# Patient Record
Sex: Female | Born: 1935 | Race: White | Hispanic: No | Smoking: Never smoker
Health system: Southern US, Community
[De-identification: ages and names within clinical notes are randomized; demographics above are authoritative.]

## PROBLEM LIST (undated history)

## (undated) DIAGNOSIS — M545 Low back pain, unspecified: Secondary | ICD-10-CM

## (undated) DIAGNOSIS — J189 Pneumonia, unspecified organism: Secondary | ICD-10-CM

## (undated) DIAGNOSIS — K589 Irritable bowel syndrome without diarrhea: Secondary | ICD-10-CM

## (undated) DIAGNOSIS — G8929 Other chronic pain: Secondary | ICD-10-CM

## (undated) DIAGNOSIS — I10 Essential (primary) hypertension: Secondary | ICD-10-CM

## (undated) DIAGNOSIS — Z8719 Personal history of other diseases of the digestive system: Secondary | ICD-10-CM

## (undated) DIAGNOSIS — J479 Bronchiectasis, uncomplicated: Secondary | ICD-10-CM

## (undated) DIAGNOSIS — M1990 Unspecified osteoarthritis, unspecified site: Secondary | ICD-10-CM

## (undated) DIAGNOSIS — Z8711 Personal history of peptic ulcer disease: Secondary | ICD-10-CM

## (undated) HISTORY — PX: NASAL SEPTUM SURGERY: SHX37

## (undated) HISTORY — PX: DILATION AND CURETTAGE OF UTERUS: SHX78

## (undated) HISTORY — PX: FRACTURE SURGERY: SHX138

## (undated) HISTORY — PX: CARPAL TUNNEL RELEASE: SHX101

## (undated) HISTORY — PX: CATARACT EXTRACTION W/ INTRAOCULAR LENS  IMPLANT, BILATERAL: SHX1307

## (undated) HISTORY — PX: TUBAL LIGATION: SHX77

## (undated) HISTORY — PX: JOINT REPLACEMENT: SHX530

## (undated) HISTORY — PX: ROTATOR CUFF REPAIR: SHX139

## (undated) HISTORY — PX: TOTAL KNEE ARTHROPLASTY: SHX125

## (undated) HISTORY — PX: BACK SURGERY: SHX140

## (undated) HISTORY — PX: POSTERIOR FUSION LUMBAR SPINE: SUR632

---

## 1999-01-14 ENCOUNTER — Other Ambulatory Visit: Admission: RE | Admit: 1999-01-14 | Discharge: 1999-01-14 | Payer: Self-pay | Admitting: *Deleted

## 1999-08-16 ENCOUNTER — Encounter: Admission: RE | Admit: 1999-08-16 | Discharge: 1999-08-16 | Payer: Self-pay | Admitting: General Surgery

## 1999-08-16 ENCOUNTER — Encounter: Payer: Self-pay | Admitting: General Surgery

## 2000-03-20 ENCOUNTER — Other Ambulatory Visit: Admission: RE | Admit: 2000-03-20 | Discharge: 2000-03-20 | Payer: Self-pay | Admitting: *Deleted

## 2000-12-17 ENCOUNTER — Encounter: Admission: RE | Admit: 2000-12-17 | Discharge: 2000-12-17 | Payer: Self-pay | Admitting: General Surgery

## 2000-12-17 ENCOUNTER — Encounter: Payer: Self-pay | Admitting: General Surgery

## 2002-03-31 ENCOUNTER — Encounter: Admission: RE | Admit: 2002-03-31 | Discharge: 2002-03-31 | Payer: Self-pay | Admitting: General Surgery

## 2002-03-31 ENCOUNTER — Encounter: Payer: Self-pay | Admitting: General Surgery

## 2002-04-12 ENCOUNTER — Ambulatory Visit (HOSPITAL_BASED_OUTPATIENT_CLINIC_OR_DEPARTMENT_OTHER): Admission: RE | Admit: 2002-04-12 | Discharge: 2002-04-12 | Payer: Self-pay | Admitting: General Surgery

## 2002-04-12 ENCOUNTER — Encounter (INDEPENDENT_AMBULATORY_CARE_PROVIDER_SITE_OTHER): Payer: Self-pay | Admitting: Specialist

## 2003-03-03 ENCOUNTER — Encounter: Admission: RE | Admit: 2003-03-03 | Discharge: 2003-03-03 | Payer: Self-pay | Admitting: Specialist

## 2003-03-03 ENCOUNTER — Encounter: Payer: Self-pay | Admitting: Specialist

## 2003-05-23 ENCOUNTER — Encounter: Payer: Self-pay | Admitting: General Surgery

## 2003-05-23 ENCOUNTER — Encounter: Admission: RE | Admit: 2003-05-23 | Discharge: 2003-05-23 | Payer: Self-pay | Admitting: General Surgery

## 2003-11-09 ENCOUNTER — Ambulatory Visit (HOSPITAL_COMMUNITY): Admission: RE | Admit: 2003-11-09 | Discharge: 2003-11-09 | Payer: Self-pay | Admitting: Ophthalmology

## 2004-06-05 ENCOUNTER — Inpatient Hospital Stay (HOSPITAL_COMMUNITY): Admission: AD | Admit: 2004-06-05 | Discharge: 2004-06-07 | Payer: Self-pay | Admitting: *Deleted

## 2004-10-02 ENCOUNTER — Encounter: Admission: RE | Admit: 2004-10-02 | Discharge: 2004-10-02 | Payer: Self-pay | Admitting: General Surgery

## 2004-10-23 ENCOUNTER — Ambulatory Visit (HOSPITAL_COMMUNITY): Admission: RE | Admit: 2004-10-23 | Discharge: 2004-10-23 | Payer: Self-pay | Admitting: Gastroenterology

## 2005-01-31 ENCOUNTER — Encounter: Admission: RE | Admit: 2005-01-31 | Discharge: 2005-01-31 | Payer: Self-pay | Admitting: Specialist

## 2005-02-27 ENCOUNTER — Inpatient Hospital Stay (HOSPITAL_COMMUNITY): Admission: RE | Admit: 2005-02-27 | Discharge: 2005-03-04 | Payer: Self-pay | Admitting: Orthopaedic Surgery

## 2005-03-07 ENCOUNTER — Ambulatory Visit (HOSPITAL_COMMUNITY): Admission: RE | Admit: 2005-03-07 | Discharge: 2005-03-07 | Payer: Self-pay | Admitting: Orthopaedic Surgery

## 2005-11-18 ENCOUNTER — Encounter: Admission: RE | Admit: 2005-11-18 | Discharge: 2005-11-18 | Payer: Self-pay | Admitting: General Surgery

## 2005-12-01 ENCOUNTER — Inpatient Hospital Stay (HOSPITAL_COMMUNITY): Admission: RE | Admit: 2005-12-01 | Discharge: 2005-12-05 | Payer: Self-pay | Admitting: Specialist

## 2006-01-21 ENCOUNTER — Encounter: Admission: RE | Admit: 2006-01-21 | Discharge: 2006-01-21 | Payer: Self-pay | Admitting: Orthopaedic Surgery

## 2006-01-29 ENCOUNTER — Ambulatory Visit (HOSPITAL_COMMUNITY): Admission: RE | Admit: 2006-01-29 | Discharge: 2006-01-29 | Payer: Self-pay | Admitting: Orthopaedic Surgery

## 2006-04-24 IMAGING — CR DG CHEST 2V
2 series · 2 of 2 positions shown · non-contrast
Comparison: none

CLINICAL DATA: OA left knee, some congestion.   Preop work-up. 
CHEST - 2 VIEW: 
Mild lower thoracic scoliosis convex to the left.  Mild left ventricular enlargement/hypertrophy.  Overall cardiac size appears within normal limits.  No pulmonary vascular congestion or active lung process.  Transpedicular screws mid and lower lumbar spine.   Possible faintly calcified right hilar lymph nodes.

[view not recorded (1 of 2)]
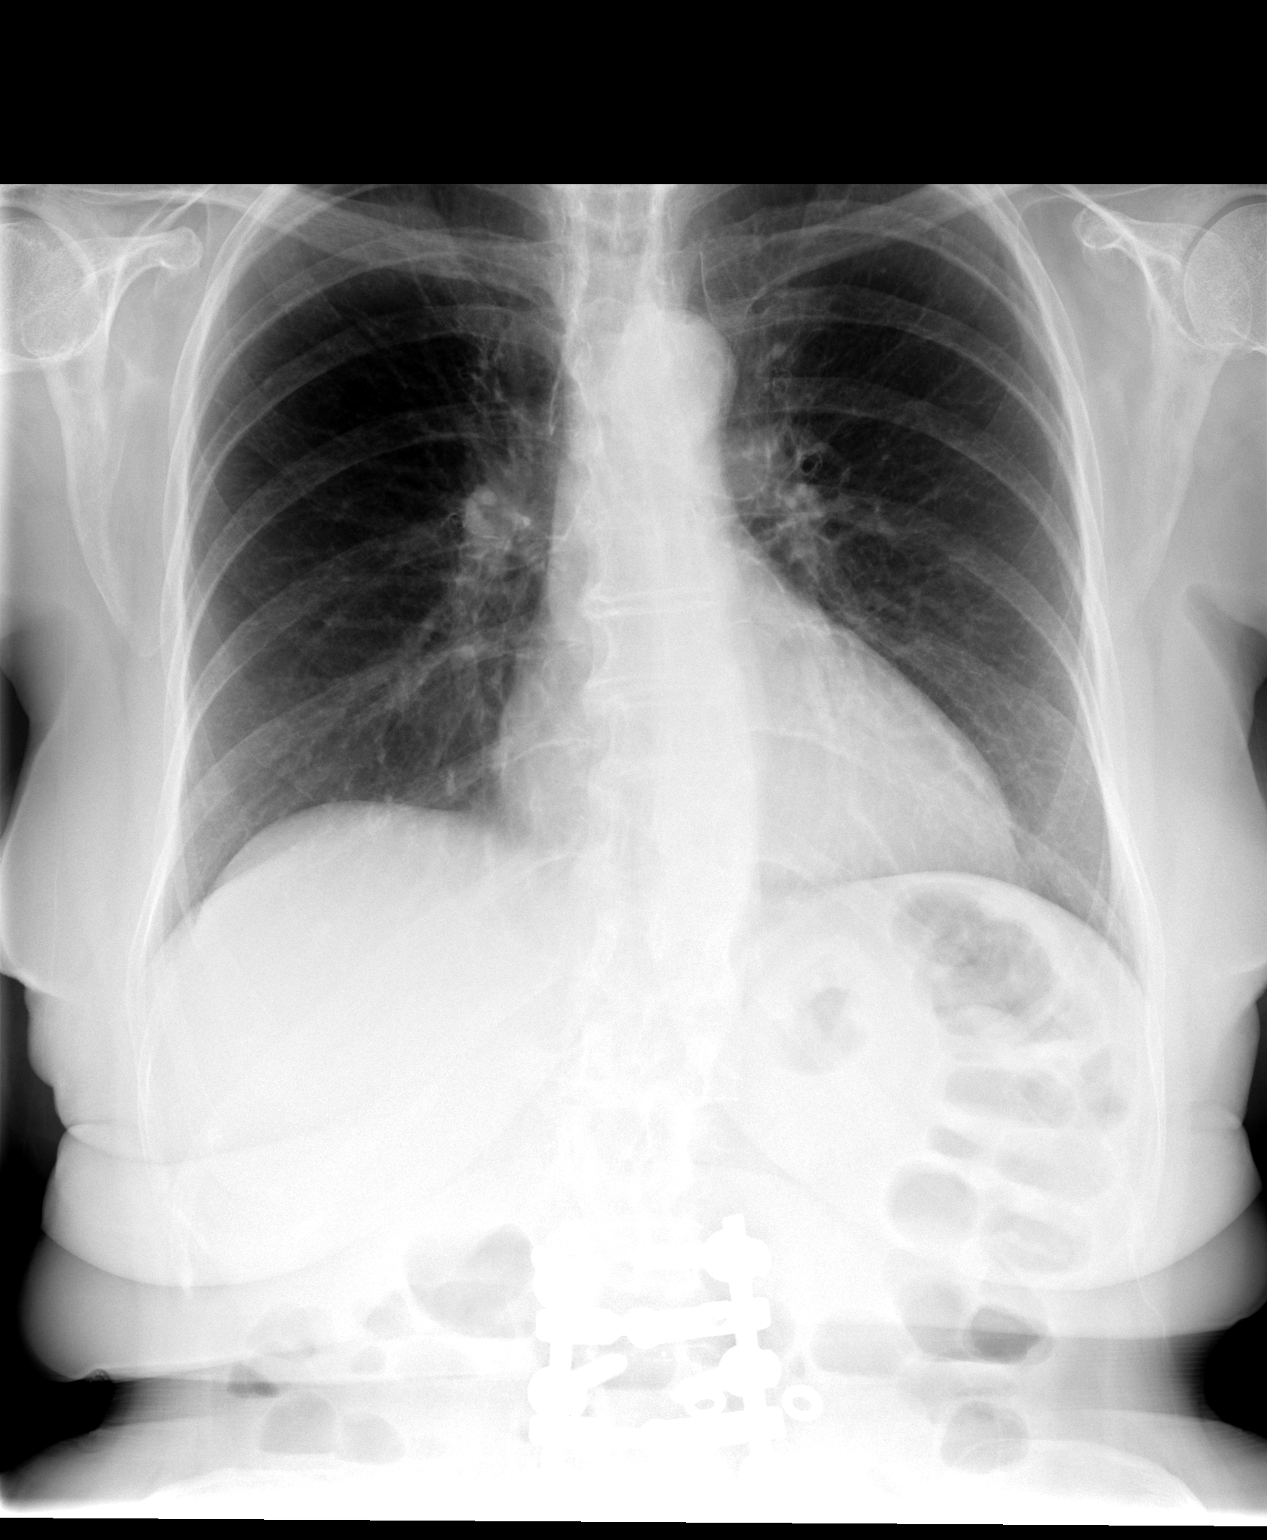

[view not recorded (2 of 2)]
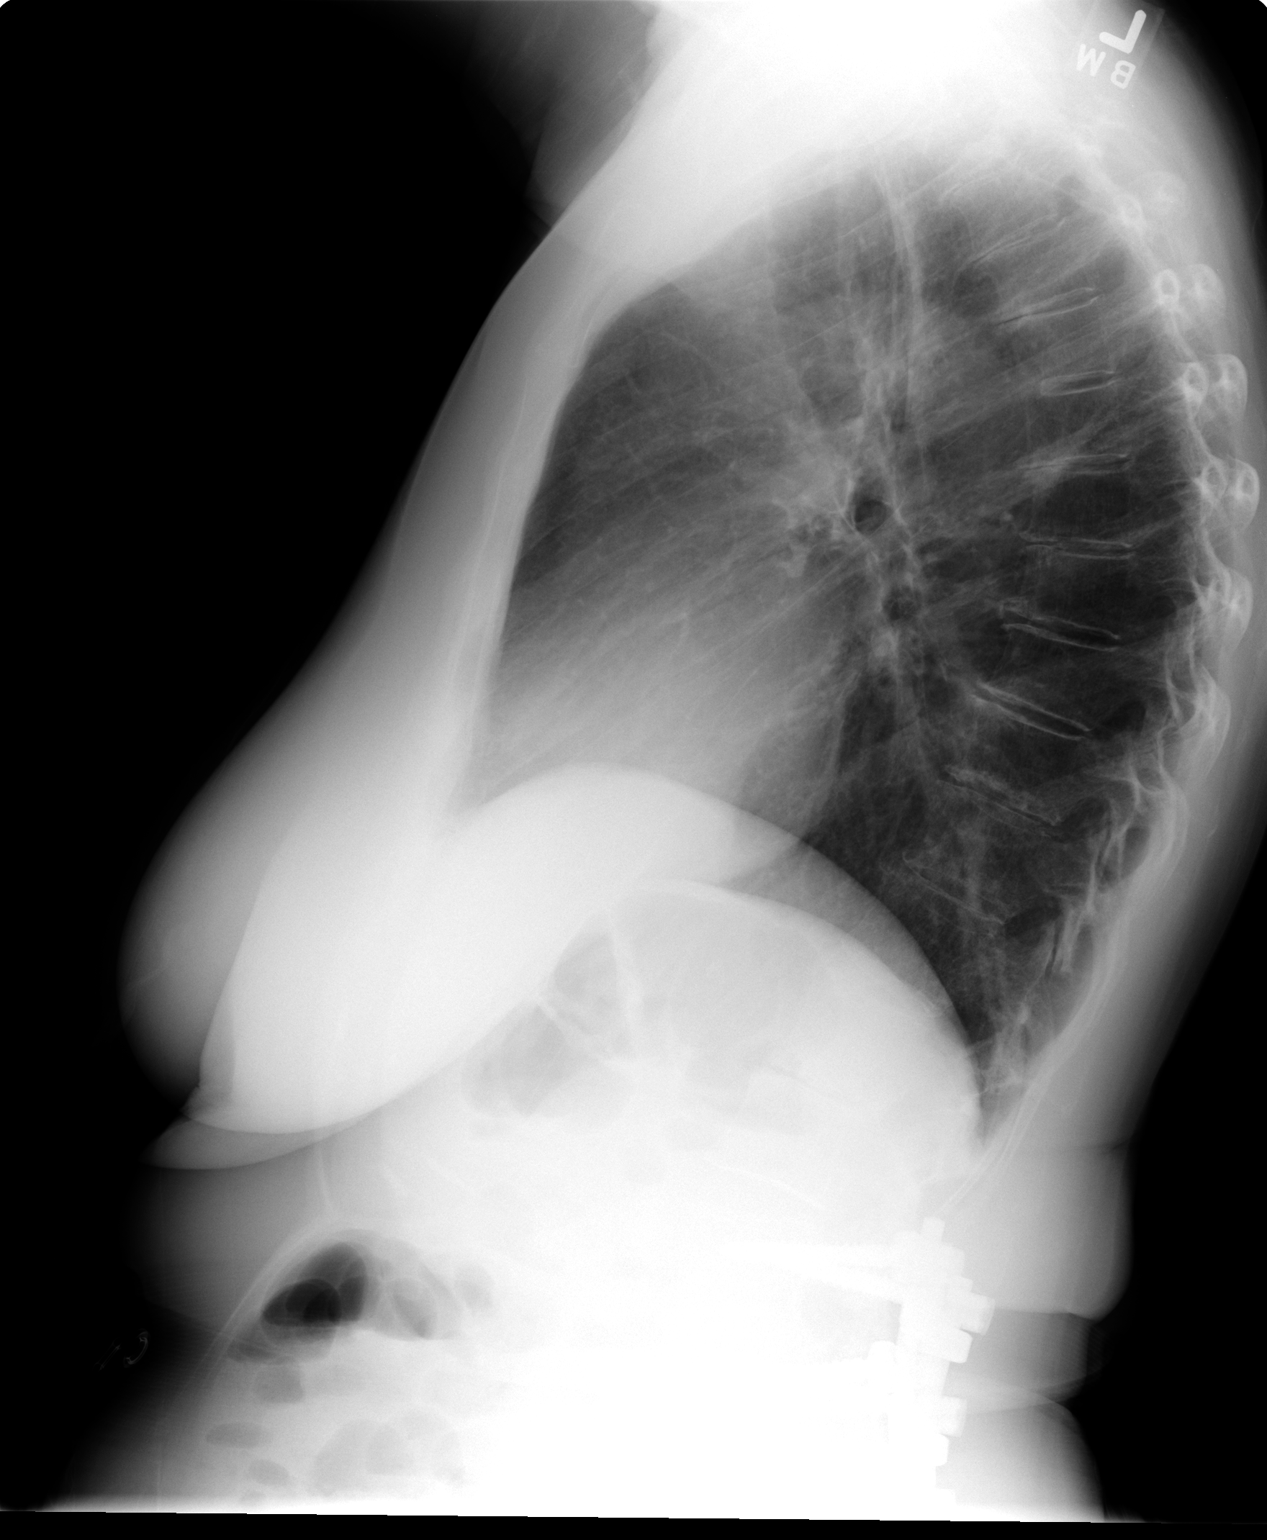

[2 of 2 positions shown; findings below may reference images not displayed]

IMPRESSION: No active cardiopulmonary disease.

## 2006-11-30 ENCOUNTER — Encounter: Admission: RE | Admit: 2006-11-30 | Discharge: 2006-11-30 | Payer: Self-pay | Admitting: General Surgery

## 2008-02-20 ENCOUNTER — Encounter: Admission: RE | Admit: 2008-02-20 | Discharge: 2008-02-20 | Payer: Self-pay | Admitting: Orthopaedic Surgery

## 2008-03-14 ENCOUNTER — Encounter: Admission: RE | Admit: 2008-03-14 | Discharge: 2008-03-14 | Payer: Self-pay | Admitting: Orthopaedic Surgery

## 2008-04-18 ENCOUNTER — Encounter: Admission: RE | Admit: 2008-04-18 | Discharge: 2008-04-18 | Payer: Self-pay | Admitting: Orthopaedic Surgery

## 2008-05-12 ENCOUNTER — Encounter: Admission: RE | Admit: 2008-05-12 | Discharge: 2008-05-12 | Payer: Self-pay | Admitting: Orthopaedic Surgery

## 2008-08-09 ENCOUNTER — Encounter: Admission: RE | Admit: 2008-08-09 | Discharge: 2008-08-09 | Payer: Self-pay | Admitting: Family Medicine

## 2009-06-20 ENCOUNTER — Encounter (HOSPITAL_COMMUNITY): Admission: RE | Admit: 2009-06-20 | Discharge: 2009-08-10 | Payer: Self-pay | Admitting: Orthopaedic Surgery

## 2009-12-08 ENCOUNTER — Other Ambulatory Visit: Payer: Self-pay | Admitting: Emergency Medicine

## 2009-12-08 ENCOUNTER — Emergency Department (HOSPITAL_COMMUNITY): Admission: EM | Admit: 2009-12-08 | Discharge: 2009-12-08 | Payer: Self-pay | Admitting: Emergency Medicine

## 2010-11-29 ENCOUNTER — Other Ambulatory Visit: Payer: Self-pay | Admitting: Obstetrics and Gynecology

## 2010-11-29 DIAGNOSIS — Z1231 Encounter for screening mammogram for malignant neoplasm of breast: Secondary | ICD-10-CM

## 2010-12-06 ENCOUNTER — Ambulatory Visit: Payer: Self-pay

## 2010-12-17 ENCOUNTER — Other Ambulatory Visit: Payer: Self-pay | Admitting: Obstetrics and Gynecology

## 2010-12-17 DIAGNOSIS — R58 Hemorrhage, not elsewhere classified: Secondary | ICD-10-CM

## 2010-12-24 ENCOUNTER — Ambulatory Visit (HOSPITAL_COMMUNITY)
Admission: RE | Admit: 2010-12-24 | Discharge: 2010-12-24 | Disposition: A | Discharge status: Home / Self Care | Payer: Medicare Other | Source: Ambulatory Visit | Attending: Obstetrics and Gynecology | Admitting: Obstetrics and Gynecology

## 2010-12-24 ENCOUNTER — Other Ambulatory Visit: Payer: Self-pay | Admitting: Obstetrics and Gynecology

## 2010-12-24 DIAGNOSIS — D259 Leiomyoma of uterus, unspecified: Secondary | ICD-10-CM | POA: Insufficient documentation

## 2010-12-24 DIAGNOSIS — R58 Hemorrhage, not elsewhere classified: Secondary | ICD-10-CM

## 2010-12-24 DIAGNOSIS — N938 Other specified abnormal uterine and vaginal bleeding: Secondary | ICD-10-CM | POA: Insufficient documentation

## 2010-12-24 DIAGNOSIS — N949 Unspecified condition associated with female genital organs and menstrual cycle: Secondary | ICD-10-CM | POA: Insufficient documentation

## 2010-12-30 LAB — PROTIME-INR
INR: 0.99 (ref 0.00–1.49)
Prothrombin Time: 13 seconds (ref 11.6–15.2)

## 2010-12-30 LAB — BASIC METABOLIC PANEL
BUN: 19 mg/dL (ref 6–23)
Calcium: 8.7 mg/dL (ref 8.4–10.5)
GFR calc non Af Amer: >60 mL/min (ref >60)
Potassium: 4.3 mEq/L (ref 3.5–5.1)

## 2010-12-30 LAB — CBC
HCT: 36.2 % (ref 36.0–46.0)
Platelets: 295 10*3/uL (ref 150–400)
WBC: 5.8 10*3/uL (ref 4.0–10.5)

## 2010-12-30 LAB — DIFFERENTIAL
Eosinophils Relative: 1 % (ref 0–5)
Lymphocytes Relative: 26 % (ref 12–46)
Lymphs Abs: 1.5 10*3/uL (ref 0.7–4.0)
Neutro Abs: 3.8 10*3/uL (ref 1.7–7.7)
Neutrophils Relative %: 65 % (ref 43–77)

## 2010-12-30 LAB — APTT: aPTT: 28 seconds (ref 24–37)

## 2011-02-21 NOTE — Op Note (Signed)
NAMEMAYLEY, LISH              ACCOUNT NO.:  [id number]   MEDICAL RECORD NO.:  [id number]          PATIENT TYPE:  AMB   LOCATION:  SDS                          FACILITY:  [facility]   PHYSICIAN:  Sharolyn Douglas, M.D.        DATE OF BIRTH:  [birth year]   DATE OF PROCEDURE:  01/29/2006  DATE OF DISCHARGE:  01/29/2006                                 OPERATIVE REPORT   DIAGNOSES:  1.  Left sacroiliac joint pain.  2.  History of previous three-leve lumbar fusion, L3 through S1.   PROCEDURE:  1.  Left sacroiliac joint injection.  2.  Fluoroscopic imaging used for left sacroiliac joint injection.   SURGEON:  Sharolyn Douglas, M.D.   ASSISTANT:  None.   ANESTHESIA:  MAC plus local.   ESTIMATED BLOOD LOSS:  None.   INDICATIONS:  The patient is a pleasant 77 year old female who had a three-  level lumbar fusion and did very well postoperatively.  She recently had a  left total knee replacement during physical therapy. She developed pain in  her left hip.  On exam, this was felt to be localized the left SI joint.  She now presents for left SI joint diagnostic and possible therapeutic  injection.   PROCEDURE:  She was identified in the holding area, taken to the operating  room.  She was given prophylactic IV antibiotics.  She was given sedation by  the anesthesia department, carefully positioned prone.  The left hip was  prepped and draped in the usual sterile fashion.  Fluoroscopy was brought  into the field, and the SI joint was imaged.  The skin overlying the  injection site was anesthetized with 3 cc of lidocaine 1% without  epinephrine.  An 22-gauge spinal needle was then carefully placed into the  SI joint using the fluoroscopy.  One cc of Omnipaque was injected, and we  had good intra-articular spread of the injection.  Aspiration was performed,  and there was no blood.  We then injected 40 mg of Depo-Medrol and 4 cc of  1% lidocaine into the joint.  The needle was removed, and a Band-Aid  was  placed.  The patient was then turned supine and transferred to recovery in  stable condition.  She noted immediate improvement in her left hip pain.  She is discharged home in good condition.  If she has any increased pain or  redness at the injection site, she will call the office.  Otherwise, follow-  up in two weeks.      Sharolyn Douglas, M.D.  Electronically Signed    MC/MEDQ  D:  01/29/2006  T:  01/30/2006  Job:  [id number]

## 2011-02-21 NOTE — Op Note (Signed)
NAME:  [name], SCHOCH                        ACCOUNT NO.:  [id number]   MEDICAL RECORD NO.:  [id number]                   PATIENT TYPE:  OIB   LOCATION:  2899                                 FACILITY:  [facility]   PHYSICIAN:  Robert L. Dione Booze, M.D.               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   PROCEDURE:  Upper eyelid blepharoplasties.   SURGEON:  Robert L. Dione Booze, M.D.   ANESTHESIA:  1% Xylocaine with epinephrine.   INDICATIONS FOR PROCEDURE:  This lady has been followed  in my office for  several years with worsening dermatochalosis of the skin  of her upper  eyelids. She has had bilateral cataract surgery about a year ago and did  extremely well. When seen on August 09, 2003, she did have multiple  complaints regarding the skin of the upper lids and could feel the weight of  the skin  and it blocks her upper  field of vision and other  complaints.  The procedure was discussed and she was told that bleeding is the worst  problem, and she was  told of other  possible  complications. The procedure  was discussed in detail.   Photographs were taken, and show how the skin covers her eyelids and the  margin reflex distance has been measured as approximately 2 mm. The visual  field testing shows a very large loss of the superior  field where the skin  is taped compared to when  it is not taped. Approximately 3/4ths of the  field is lost, which amounts to around 25 degrees of the superior  field in  each eye, which is a large amount. She decided that she wanted to have the  skin  removed for optical reasons and since she has multiple complaints.  Medically she  should be stable for this.   Justification to perform the procedure in an outpatient setting is routine.  Justification for overnight stay is none.   DESCRIPTION OF PROCEDURE:  The patient arrived in the minor surgery room at  [facility] and was prepped and draped. The skin to be removed was  carefully  demarcated and next the skin was injected using local  infiltration.  The skin was then carefully  removed and bleeding was  controlled with electrocautery. Some underlying fatty tissue  was excised.   The wound was closed on each side using running 6-0 nylon suture and cold  packs were applied. The eyes were not patched. Polysporin ointment was used.  She left the operating room having done well   For follow up  care the patient will be seen in my office in 5 to 6 days to  have the sutures removed. For bleeding she is to use pressure.                                               Robert L. Dione Booze, M.D.    RLG/MEDQ  D:  11/11/2003  T:  11/12/2003  Job:  [id number]   cc:   Molly Maduro L. Foy Guadalajara, M.D.  [address] [place]  [place] [address]  Fax: [phone]   Joette Catching, M.D.

## 2011-02-21 NOTE — H&P (Signed)
[name], [name]:  [id number]   MEDICAL RECORD NO.:  [id number]          PATIENT TYPE:  INP   LOCATION:  NA                           FACILITY:  [facility]   PHYSICIAN:  Erasmo Leventhal, M.D.DATE OF BIRTH:  [birth year]   DATE OF ADMISSION:  12/01/2005  DATE OF DISCHARGE:                                HISTORY & PHYSICAL   DATE OF SURGERY:  December 01, 2005.   CHIEF COMPLAINT:  Left knee end-stage osteoarthritis.   HISTORY OF PRESENT ILLNESS:  This is a 77 year old lady with history of end-  stage osteoarthritis of the left knee.  She has failed conservative  management of her left knee pain and has pain with all activities and at  rest. She has had a previous total knee arthroplasty of the right knee with  good results and now requests total knee arthroplasty of the left knee.  The  surgery risks, benefits and aftercare were discussed in detail with the  patient, questions were invited and answered.  She received medical  clearance from Dr. Dewaine Oats, her medical doctor, and at this time she is  scheduled for total knee arthroplasty of the left knee.   PAST MEDICAL HISTORY:  Serious medical illnesses include constipation,  hypertension, reflux and depression.   ALLERGIES:  DRUG ALLERGY TO CODEINE with nausea and vomiting.   CURRENT MEDICATIONS:  1.  Nexium 40 mg one p.o. daily.  2.  Zelnorm 0.6 mg one b.i.d.  3.  Glycalox 17 grams q.h.s.  4.  Senokot b.i.d.  5.  Milk Of Magnesia four tablespoons q.h.s.  6.  Prempro 0.3/1.5 daily.  7.  Concerta 36 mg one daily.  8.  Cymbalta 20 mg two q.h.s.  9.  Triazolam 0.25 mg one q.h.s.  10. Lopressor 50 mg one-half tablet b.i.d.   PAST SURGICAL HISTORY:  Include nasal surgery, bunionectomy, lumbar  laminectomy, total knee arthroplasty right knee, right rotator cuff repair,  lumbar fusion X4, carpal tunnel release on the right.   FAMILY HISTORY:  Positive for cancer.   SOCIAL HISTORY:  The patient is  widowed. She is retired. She lives at home.  She does not smoke and drinks a glass of wine occasionally.   REVIEW OF SYSTEMS:  CENTRAL NERVOUS SYSTEM:  Positive for history of  depression, otherwise negative.  CARDIOVASCULAR:  Positive for hypertension,  negative for chest pain or palpitations.  PULMONARY:  No shortness of  breath, paroxysmal nocturnal dyspnea or orthopnea.  GASTROINTESTINAL:  Positive for reflux, negative for ulcers.  GENITOURINARY:  Negative for  urinary tract difficulty.  MUSCULOSKELETAL:  Positives in history of present  illness.   PHYSICAL EXAMINATION:  VITAL SIGNS:  Blood pressure 128/62, respirations 14,  pulse 60 and regular.  GENERAL APPEARANCE:  This is a well-developed, well-nourished lady in no  acute distress.  HEENT:  Head is normocephalic.  Nose patent. Ears are patent. Pupils equal,  round, reactive to light.  Throat without injection.  NECK:  Supple with no lymphadenopathy.  Carotids 2+ without bruits.  CHEST:  Clear to auscultation with no rales or rhonchi.  Respirations 14.  HEART:  Regular rate and rhythm, 60 beats per minute without murmurs.  ABDOMEN:  Soft with active bowel sounds. No masses, organomegaly.  NEUROLOGICAL:  Patient is alert and oriented to time, place, person.  Cranial nerves II-XII grossly intact.  EXTREMITIES:  The right knee is status post total knee arthroplasty with 0  to 120 degree range of motion, excellent stability.  The left knee shows a  slight varus deformity 0 to 130 degree range of motion with crepitation.  Dorsalis pedis pulse and posterior tibial pulses are 2+.  NEUROLOGICAL:  Neurovascular status is intact.   CLINICAL DATA:  X-rays show end-stage osteoarthritis of the left knee.   IMPRESSION:  End-stage osteoarthritis of left knee.   PLAN:  Total knee arthroplasty left knee.      Jaquelyn Bitter. Chabon, P.A.    ______________________________  Erasmo Leventhal, M.D.    SJC/MEDQ  D:  11/26/2005  T:   11/26/2005  Job:  [id number]

## 2011-02-21 NOTE — Op Note (Signed)
NAME:  [name], [name]:  [id number]   MEDICAL RECORD NO.:  [id number]          PATIENT TYPE:  AMB   LOCATION:  ENDO                         FACILITY:  [facility]   PHYSICIAN:  Graylin Shiver, M.D.   DATE OF BIRTH:  06/25/1930   DATE OF PROCEDURE:  10/23/2004  DATE OF DISCHARGE:                                 OPERATIVE REPORT   PROCEDURE PERFORMED:  Esophagogastroduodenoscopy with biopsy for CLO test.   INDICATION:  Chest pain, etiology unclear, rule out upper GI lesion.   Informed consent was obtained after explanation of the risks of bleeding,  infection and perforation.   PREMEDICATION:  Fentanyl 75 mcg IV, Versed 7.5 milligrams IV.   PROCEDURE:  With the patient in the left lateral decubitus position, the  Olympus gastroscope was inserted into the oropharynx and passed into the  esophagus. It was advanced down the esophagus, then into the stomach and  into the duodenum. The second portion and bulb of the duodenum were normal.  The stomach showed a diffuse erythema compatible with gastritis. Biopsy for  CLO-test was obtained. No lesions were seen in the fundus or cardia.  There  was small hiatal hernia.  The esophagus looked normal in its entirety. She  tolerated the procedure well without complications.   IMPRESSION:  1.  Small hiatal hernia.  2.  Gastritis.   PLAN:  The CLO test will be checked.       SFG/MEDQ  D:  10/23/2004  T:  10/23/2004  Job:  [id number]   cc:   Molly Maduro L. Foy Guadalajara, M.D.  [address] [place]  [place] [address]  Fax: [phone]

## 2011-02-21 NOTE — Discharge Summary (Signed)
[name], [name]:  [id number]   MEDICAL RECORD NO.:  [id number]          PATIENT TYPE:  INP   LOCATION:  3715                         FACILITY:  [facility]   PHYSICIAN:  Meade Maw, M.D.    DATE OF BIRTH:  02-14-1930   DATE OF ADMISSION:  06/05/2004  DATE OF DISCHARGE:  06/07/2004                                 DISCHARGE SUMMARY   ADMISSION DIAGNOSES:  1.  Chest pain.  2.  Gastroesophageal reflux disease.   DISCHARGE DIAGNOSES:  1.  Chest pain, negative for myocardial infarction by cardiac enzymes and      electrocardiogram.  2.  Irritable bowel syndrome/constipation.  3.  Gastroesophageal reflux disease.   IMAGING PROCEDURES:  Stress Cardiolite study June 06, 2004.   COMPLICATIONS:  None.   CONDITION ON DISCHARGE:  Stable.   HISTORY OF PRESENT ILLNESS:  Please see complete H&P for details, but in  short this is a 77 year old female with no prior history of CAD who  presented to the emergency room on June 05, 2004 with a two-week history  of chest pressure.  Initially, her symptoms began at night and would be  somewhat better after a bowel movement.  She at first thought it was  secondary to constipation with this occurring almost every night.  She then  began experiencing pain in her jaw and her neck.  It began occurring more  than once a day and at other times than at night.  She presented to Dr.  Lindell Spar office on the day of admission for evaluation.  While there, she  developed chest pain.  She was given one sublingual nitroglycerin with  relief.  She was sent to the ER for admission.   PHYSICAL EXAMINATION ON ADMISSION:  Please see complete H&P, but in short,  her vital signs were stable.  She was afebrile.  Physical exam was  essentially without any abnormalities.   EKG showed a sinus rhythm with sinus tachycardia with a right bundle branch  block and a ventricular rate of 132.   LABORATORIES ON ADMISSION:  Normal CBC.   Potassium was slightly low at 3.4.  Glucose 100.  LFTs and renal function normal.  Cardiac enzymes were also  normal for the initial set.   HOSPITAL COURSE:  She was admitted to rule out MI with serial enzymes.  A  stress Cardiolite study was planned.  Sublingual nitroglycerin for recurrent  chest pain.  Check fasting lipid profile.  Start Lovenox and low-dose beta  blocker.   The patient had some recurrent chest and jaw pain during that night.  Vital  signs remained stable.  EKG was performed without changes.  Her discomfort  was relieved with one sublingual nitroglycerin.  Repeat cardiac enzymes were  normal x2 more sets.  A fasting lipid profile showed a total of 197,  triglycerides 142, HDL 54, LDL 115.   A stress Cardiolite study was performed on June 06, 2004.  The results  were negative for ischemia or fixed perfusion defect.  EF was estimated at  81%.   She was discharged home without further incident.  DISCHARGE MEDICATIONS:  Prempro daily, Allegra 180 mg daily, Celebrex 200 mg  daily, Nexium 40 mg daily, Zelnorm 6 mg daily, Vicodin p.r.n., Ativan 1.5 mg  p.r.n., Lopressor 50 mg 1/2 tablet b.i.d., nitroglycerin 0.4 mg sublingually  p.r.n.   DIET:  She is to maintain a low salt diet.   ACTIVITY:  She has no activity restrictions.   She will obtain a chest x-ray prior to her followup visit with Dr. Fraser Din.   She has an appointment to see Dr. Fraser Din back for followup on Friday,  June 21, 2004 at 11 a.m.      Hele   HB/MEDQ  D:  08/15/2004  T:  08/16/2004  Job:  [id number]   cc:   [name] Maduro L. Foy Guadalajara, M.D.  [address] [place]  [place] [address]  Fax: [phone]

## 2011-02-21 NOTE — Op Note (Signed)
NAMEAMAYRANI, [name]              ACCOUNT NO.:  [id number]   MEDICAL RECORD NO.:  [id number]          PATIENT TYPE:  INP   LOCATION:  5033                         FACILITY:  [facility]   PHYSICIAN:  Sharolyn Douglas, M.D.        DATE OF BIRTH:  06/08/1930   DATE OF PROCEDURE:  02/27/2005  DATE OF DISCHARGE:                                 OPERATIVE REPORT   DIAGNOSES:  1.  Degenerative lumbar scoliosis.  2.  Degenerative spondylolisthesis, L3-4 and L4-5.  3.  Lumbar spinal stenosis.  4.  History of left L5-S1 hemilaminotomy and diskectomy.   PROCEDURE:  1.  Revision, L5-S1 laminectomy, with wide decompression of the thecal sac      and nerve roots bilaterally.  2.  Primary L3-4 and L4-5 laminectomy with wide decompression of the thecal      sac and nerve roots bilaterally.  3.  Posterior spinal arthrodesis, L3 through S1.  4.  Segmental pedicle screw instrumentation, L3 through S1, using the Abbott      spine system.  5.  Transforaminal lumbar interbody fusion, L3-4 and L4-5, with placement of      2 peak cages.  6.  Local autogenous bone graft supplemented with bone morphogenic protein.   SURGEON:  Sharolyn Douglas, M.D.   ASSISTANT:  Verlin Fester, P.A.   ANESTHESIA:  General endotracheal.   COMPLICATIONS:  None   ESTIMATED BLOOD LOSS:  300 mL.   INDICATIONS:  The patient is a pleasant 77 year old female with  progressively worsening back and bilateral lower extremity pain, left  greater than right.  She is taking escalating doses of narcotics without  relief.  She has a previous history of laminotomy on the left at L5-S1.  Her  plain radiographs show degenerative scoliosis and spondylolisthesis at L3-4  and L4-5.  CT myelogram demonstrates spinal stenosis and lateral recess  narrowing most advanced at L3-4 and L4-5 with biforaminal stenosis at L5-S1.  She has elected to undergo lumbar decompression and fusion in hopes of  improving her symptoms.  She is aware of the risk and benefits  including  adjacent segment problems above her fusion.   PROCEDURE:  The patient was identified in the holding area and taken to the  operating room.  She underwent general endotracheal intubation without  difficulty, given prophylactic IV antibiotics.  She was positioned prone on  the Wilson frame, all bony prominences padded, face and eyes protected at  all times.  The back was prepped and draped in the usual sterile fashion.  A  midline incision was made incorporating the previous L5-S1 incision.  Dissection was carried through the deep fascia.  The paraspinal muscles were  elevated out over the tips the transverse processes of L3, L4 and L5, as  well as the sacral ala bilaterally.  The L3-4 and L4-5 facet joints were  found to be widened.  The L5-S1 level was degenerative.  We were able to  demonstrate motion at L5-S1.  Deep retractors were placed.  We then  completed a revision laminectomy at L5-S1 by carefully dissecting the  epidural fibrosis  from the laminotomy defect.  The entire residual spinous  process and lamina of L5 was then removed.  The laminectomy was continued  up, removing the entire spinous process and lamina of L3 and L4.  The  laminectomies were then carried out laterally, decompressing the lateral  recess by undercutting the facets and completing foraminotomies at each  level.  We then turned our attention to placing pedicle screws at L3, L4, L5  and the sacrum bilaterally.  We utilized 6.5-mm screws in L3, L4 and L5, 7.5-  mm screws in the sacrum.  The patient's bone quality was poor and the screw  purchase was marginal.  Each pedicle hole was palpated and there were no  breeches.  We were also able to palpate the pedicles from within the spinal  canal.  The pedicle screws were stimulated using triggered EMGs and there  were no deleterious changes.  We then turned our attention to completing a  transforaminal lumbar interbody fusion on the left at L3-4 and L4-5.   Facetectomies were completed.  The transversing and exiting nerve roots were  identified and protected at all times.  Free-running EMGs were monitored.  Annulotomies were completed.  The disk space was dilated up to 9 mm at L3-4.  The cartilaginous endplates were scraped using the TLIF instruments.  Radical diskectomy was completed.  The disk space was then packed with local  autogenous bone graft and BMP sponges.  A 9-mm PEEK cage was then inserted  into the interspace and tamped anteriorly and across the midline.  A similar  procedure was carried out at L4-5, this time utilizing an 11-mm PEEK spacer.  We then placed 100-mm titanium rods which had been bent into lordosis into  the polyaxial screw heads.  Compression was applied across the L3-4 and L4-5  segments before shearing off the locking caps.  Two cross-connectors were  utilized.  The wound was irrigated.  The posterior spinal arthrodesis was  completed by decorticating the transverse process of L3, L4, L5 and the  sacral ala bilaterally.  The remaining local bone graft was packed into the  lateral gutters.  Deep Hemovac drain left in place.  Deep fascia was closed  with a running #1 Vicryl suture, subcutaneous layer closed with interrupted  2-0 Vicryl suture followed by a running 3-0 subcuticular Vicryl suture on  the skin edges, Benzoin and Steri-Strips placed, and sterile dressing  applied.  The patient was turned supine, extubated without difficulty and  transferred to recovery room in stable condition, able to move her upper and  lower extremities.      MC/MEDQ  D:  02/27/2005  T:  02/28/2005  Job:  [id number]

## 2011-02-21 NOTE — Discharge Summary (Signed)
[name], [name]:  [id number]   MEDICAL RECORD NO.:  [id number]          PATIENT TYPE:  INP   LOCATION:  1503                         FACILITY:  [facility]   PHYSICIAN:  Erasmo Leventhal, M.D.DATE OF BIRTH:  1930/02/20   DATE OF ADMISSION:  12/01/2005  DATE OF DISCHARGE:  12/05/2005                                 DISCHARGE SUMMARY   ADMISSION DIAGNOSES:  End-stage on admission, left knee.   DISCHARGE DIAGNOSES:  End-stage on admission, left knee.   OPERATION:  Total knee arthroplasty, left knee.   BRIEF HISTORY:  This is a 77 year old lady with a history of end-stage  osteoarthritis of the left knee that has failed conservative management.  After discussion of the treatment, benefits, risks and options, the patient  now scheduled for total knee arthroplasty. Surgery is to go ahead as  scheduled.   LABORATORY DATA:  Admission CBC showed hemoglobin low at 10.9, hematocrit  low at 31.6, RBC low at 3.9. She reached a low of 8.7 and 25.8 on the 2nd as  far as hemoglobin and hematocrit were concerned. Admission CMET within  normal limits. Admission PT and PTT within normal limits. PT/INR at  discharge was 38.0 on the PT and INR 3.9. Her admission CMET showed total  protein and albumin slightly low, glucose slightly high at 114 otherwise  normal. She ran mildly elevated glucose throughout admission, calcium was  mildly decreased at 8.1. Urinalysis within normal limits.   HOSPITAL COURSE:  The patient tolerated the operative procedure well.  The  first postoperative day she was feeling good, vital signs stable, afebrile.  O2 98 on 2 liters. I&O's were good, dressing was dry and drain was removed  without difficulty. The second postoperative day she was comfortable, vital  signs stable, she was afebrile, dressing was changed, the wound looked good.  Calves were negative, neurovascular status intact. Hemoglobin and hematocrit  were acceptable and lytes were  okay. Discharge plan was made for Saturday.  The patient had some vision symptoms on December 03, 2005 and Dr. Dione Booze was  called to evaluate. The patient was evaluated by Cherly Beach, PA and  this was discussed with Dr. Abundio Miu and subsequently a CT scan was ordered  of her head to rule out intracranial bleed as a cause of her blurred vision.  Her INR was noted to be at the upper end of therapeutic range. On December 04, 2005, blurred vision had resolved, patient was comfortable, vital signs were  stable. She was afebrile, hemoglobin 8.8. CT scan was negative. Dressing was  changed, wound was benign, calves were negative. The patient wanted to go  home and plans were made for discharge in the morning. Despite being ordered  to hold Coumadin, on March 1 Coumadin was inadvertently given and we will  watch INR and hold her Coumadin for the first day or two after discharge  until she gets back down into the 2 to 2.5 range. On December 05, 2005, vital  signs stable, afebrile, wound benign, no inadvertent swelling, increased  PT/INR, hemoglobin and hematocrit acceptable, the patient was subsequently  discharged home for followup in the office.   CONDITION ON DISCHARGE:  Improved.   DISCHARGE MEDICATIONS:  1.  Percocet 1-2 q.4-6 h p.r.n. pain.  2.  Robaxin 500 1 p.o. q.8 h p.r.n. spasm.  3.  Coumadin to take as directed by the pharmacist.   Followup in the office in 2 weeks. Discharge instructions given.      [name]. Chabon, P.A.    ______________________________  Erasmo Leventhal, M.D.    SJC/MEDQ  D:  12/15/2005  T:  12/16/2005  Job:  [id number]

## 2011-02-21 NOTE — Op Note (Signed)
[name], [name]              ACCOUNT NO.:  [id number]   MEDICAL RECORD NO.:  [id number]          PATIENT TYPE:  AMB   LOCATION:  ENDO                         FACILITY:  [facility]   PHYSICIAN:  Graylin Shiver, M.D.   DATE OF BIRTH:  12-27-1929   DATE OF PROCEDURE:  10/23/2004  DATE OF DISCHARGE:                                 OPERATIVE REPORT   PROCEDURE PERFORMED:  Colonoscopy.   INDICATION:  Screening.   Informed consent was obtained after explanation of the risks of bleeding,  infection and perforation.   PREMEDICATION:  The procedure was done immediately after an EGD with an  additional 25 mcg of fentanyl and 2.5 milligrams of Versed given.   PROCEDURE:  With the patient in the left lateral decubitus position, a  rectal exam was performed. No masses were felt. The Olympus colonoscope was  inserted into the rectum and advanced around the colon to the cecum. Cecal  landmarks were identified.  The cecum and ascending colon were normal. The  transverse colon normal. The descending colon and sigmoid showed a few  diverticula.  The rectum was normal.  The patient tolerated the procedure  well without complications.   IMPRESSION:  Mild diverticulosis of the left colon.  Otherwise normal  colonoscopy to cecum   I would recommend a follow-up screening colonoscopy again in 10 years.       SFG/MEDQ  D:  10/23/2004  T:  10/23/2004  Job:  [id number]

## 2011-02-21 NOTE — Discharge Summary (Signed)
NAMECAMRYN, [name]              ACCOUNT NO.:  [id number]   MEDICAL RECORD NO.:  [id number]          PATIENT TYPE:  INP   LOCATION:  5033                         FACILITY:  [facility]   PHYSICIAN:  Sharolyn Douglas, M.D.        DATE OF BIRTH:  1929/10/16   DATE OF ADMISSION:  02/27/2005  DATE OF DISCHARGE:  03/04/2005                                 DISCHARGE SUMMARY   ADMITTING DIAGNOSES:  1.  L3-4 and L4-5 spinal stenosis and spondylolisthesis.  2.  Chronic constipation.  3.  Gastroesophageal reflux disease.   DISCHARGE DIAGNOSES:  1.  Status post L3-L5 laminectomy and posterior spinal fusion, doing well.  2.  Postoperative anemia.  3.  Postoperative hyperglycemia.  4.  Chronic constipation.  5.  Gastroesophageal reflux disease.   PROCEDURE:  On May 25, patient was taken to the operating room for an L3-S1  posterior spinal fusion with pedicle screws and T lift as well as BNP.  This  was done by Sharolyn Douglas, M.D.  Assistant was [employer], P.A.-C.  Anesthesia was general.   CONSULTS:  1.  Stroke service  2.  Medical teaching service   LABORATORIES:  Preoperative CBC:  Red count 3.66, monos of 13, otherwise  normal.  PT/INR and PTT normal.  Complete metabolic panel normal with the  exception of total protein of 5.7.  Postoperatively hemoglobin and  hematocrit were monitored, reached a low of 9.5 and 27.6 on Mar 02, 2005.  Basic metabolic panel was monitored.  Glucose remained elevated ranging from  132-161.  UA from preoperative was negative.  Blood type from preoperative  was type A+.  Rh antibody screen negative.  EKG done on Feb 28, 2005 shows  sinus tachycardia, right bundle branch block, inferior infarct age  undetermined.  Since last tracing increased rate by Dr. Donia Guiles.  EKG  from June 06, 2004 shows normal sinus rhythm, right bundle branch block.  No previous tracing read by Dr. Lady Deutscher.  EKG from May 26, the second  one, showed sinus tachycardia, low  voltage QRS, right bundle branch block,  cannot rule out inferior infarct.  Initial comments no significant change  since EKG one early with Dr. Chinook Bing.  X-rays on Feb 27, 2005  portable spine to confirm screw placement of L3-S1.  CT of the head on May  26 shows no acute intracranial abnormality, left sphenoid sinus disease.   BRIEF HISTORY:  Patient is a 77 year old female who has had increasing  problems with her back and radiation into her lower extremities.  Patient  has been resistant to conservative care including pain medicine, activity  modifications, physical therapy, epidural steroid injection.  Unfortunately,  she is on escalating doses of medication without good relief.  Because of  her severe pain and problems as well as her x-ray and MRI findings, it is  thought her best course of management would be decompression and fusion as  previously outlined.  The risks and benefits of this were discussed with the  patient by Dr. Noel Gerold.  She indicated understanding and opted to proceed with  surgery.   HOSPITAL COURSE:  On Feb 27, 2005 patient was taken to the operating room  for an L3-S1 posterior spinal fusion with pedicle screws and T lift L3-4 and  4-5.  This was done by Sharolyn Douglas, M.D., assistant [facility], P.A.-C.  She tolerated this procedure well without any intraoperative complications  and was transferred to the recovery room in stable condition.   Postoperative routine orthopedic spine protocol was followed.  She  progressed along very well through her first postoperative day.  She was  having back pain as suspected.  On the afternoon of postoperative day one,  May 26 patient was asleep and the nurse entered her room and she was unable  to wake patient up.  Her pupils were not reacting to light.  Stroke team was  called because she was having some slurred speech and altered mental status.  Over the course of the next 30 minutes to one hour patient did become  more  alert.  When the stroke team arrived she was communicating a bit better.  She was examined by the rapid response stroke team.  They determined she did  not have any acute neurologic changes and did not suspect a stroke, thought  it was secondary to being over medicated.   Internal medicine teaching service was consulted to assist [place] with the  patient's medical management through her postoperative course.  We  appreciate their assistance.   The following morning she was doing very well.  She was awake and alert.  She was sitting up on the edge of the bed.  She did feel a little bit  nauseated, but otherwise much better.  She was not having any difficulty  with speech.  She was alert and oriented x4.  She was given Zofran for her  nausea.  Her Colace was changed to Peri-Colace.  She was held at n.p.o.  until she passed flatus at which time her diet was slowly advanced.  She  continued to progress along on a daily basis with her physical therapy and  activity and by Mar 04, 2005 she had met all orthopedic goals.  She was  medically stable and ready for discharge home.   DISCHARGE PLAN:  Patient is a 77 year old female status post posterior  spinal fusion procedure doing well.  She had one episode of altered mental  status postoperatively that completely resolved and she is doing very well  at this point.   ACTIVITY:  Daily ambulation.  Brace on when she is up.  Back precautions at  all times.  No lifting heavier than 5 pounds.  Dressing changes daily.  Patient may shower.  Follow up two weeks postoperatively Dr. Noel Gerold.   DIET:  Regular home diet as tolerated.   DISCHARGE MEDICATIONS:  Patient may resume her home medications.  She is  given Norco 10/325 for pain, Robaxin for muscle spasm.  She will use calcium  daily as well as a multivitamin daily and Colace as needed and her home  laxative as needed.  FOLLOW-UP:  Two weeks postoperatively, Dr. Noel Gerold.   CONDITION ON  DISCHARGE:  Stable, improved.   DISPOSITION:  Patient will be discharged to her home with family's  assistance as well as home health physical therapy and occupational therapy.      Verlin Fester, P.A.      Sharolyn Douglas, M.D.  Electronically Signed    CM/MEDQ  D:  06/04/2005  T:  06/04/2005  Job:  [id number]

## 2011-02-21 NOTE — H&P (Signed)
NAME:  [name], MATERA NO.:  [id number]   MEDICAL RECORD NO.:  [id number]          PATIENT TYPE:  INP   LOCATION:  NA                           FACILITY:  [facility]   PHYSICIAN:  Sharolyn Douglas, M.D.        DATE OF BIRTH:  04/24/1930   DATE OF ADMISSION:  DATE OF DISCHARGE:                                HISTORY & PHYSICAL   CHIEF COMPLAINT:  Pain in my back and legs.   PRESENT ILLNESS:  This is s 77 year old white female with severe and  persistent low back pain with radiation to the lower extremities. It has  been resistant to conservative care including epidural steroid injections.  She has had a diagnosis of degenerative disk disease, L3-4 and L4-5, and has  had treatment as mentioned above. Unfortunately she now has to take eight to  ten Vicodin tablets a day without any relief and is quite miserable with her  day-to-day activities. She has essentially been unable to participate with  any real activities outside the home due to her pain and discomfort.   She walks with a forward flexed gait and has all pain with extension. She  has 2+ right ankle reflex compared to absent on the left ankle. Give-way  strength in the lower extremities is noted. Mikey Bussing is negative. She has a  mild spondylolisthesis noted on examination of the lumbar spine. CT  myelogram was reviewed and she has a severe spinal stenosis noted at L3-4  and moderately severe at L4-5. She has advanced degenerative changes noted  as well. Due to these positive findings and the fact that this patient's  life is considerably altered with her current present illness, it was felt  that she would benefit from surgical intervention and is being admitted for  an L3 to L5 laminectomy with posterior spinal fusion and pedicle screws with  bone morphogenic protein.   PAST MEDICAL HISTORY:  This patient's family physician is Dr. Marinda Elk.  She is allergic to CODEINE which causes severe nausea and she has  allergies  to TAPE used on eyelids in surgery. This has left a long lasting problem to  her eyelids. Current medications in the morning include Zelnorm 6 mg on an  empty stomach, Nexium 40 mg p.o. daily, Prempro 0.3 mg daily, metoprolol one-  half tab daily, and in the evening she takes Cymbalta 40 mg, metoprolol one-  half tab, and lorazepam 2 mg.  For severe constipation she takes Zelnorm  after eating, Glycolax 17 mg mixed in water daily, and milk of magnesia. She  takes Allegra 1.80 mg daily as needed. Medically, she has hiatal her hernia  with reflux and chronic constipation. Past surgeries have been rhinoplasty  in the 1970s, bunionectomy in 1970s and 1980s, diskectomies done in 1995 and  1996 by Dr. Roxan Hockey, total knee replacement on the right in 1997, and  shoulder surgery in 2005.   FAMILY HISTORY:  Positive for breast cancer in the mother and pulmonary  fibrosis in the father.   SOCIAL HISTORY:  The patient is married. She is a retired Runner, broadcasting/film/video.  No  intake of tobacco products. Occasional intake of alcohol. She has four  children, but states that a hired help will help her after surgery when she  gets home.   REVIEW OF SYSTEMS:  CNS: No seizure disorder, paralysis, numbness, or double  vision. The patient has radiculitis to lower extremities. CARDIOVASCULAR: No  chest pain, angina, or orthopnea. GASTROINTESTINAL: No nausea, vomiting,  melena, or bloody stool. GENITOURINARY: No discharge, dysuria, or hematuria.  MUSCULOSKELETAL: Primarily in present illness.   PHYSICAL EXAMINATION:  VITAL SIGNS: Pulse 72, respirations 12, blood  pressure 130/68.  GENERAL: Alert, cooperative, and friendly 77 year old white female looking  younger than stated age.  HEENT:  Normocephalic. PERRLA. Oropharynx is clear. EOMs are intact.  CHEST: Clear to auscultation. No rales or rhonchi.  HEART: Regular rate and rhythm.  No murmurs are heard.  ABDOMEN: Soft, nontender. Liver and spleen not  felt.  RECTAL/PELVIC/ BREASTS/GENITALIA: Not done; not pertinent to the present  illness.  EXTREMITIES: In back, as in present illness above.   ADMISSION DIAGNOSIS:  1.  L3-4, L4-5 spinal stenosis with spondylolisthesis.  2.  Chronic constipation.  3.  Gastroesophageal reflux disease.   PLAN:  The patient will undergo L3 to L5 lumbar laminectomy with posterior  spinal fusion utilizing pedicle screws and bone morphogenic protein. This  history and physical was performed in our office on Feb 20, 2005. The  patient was fitted today with a Sentric lumbosacral orthosis which will be  used postoperatively. Should we have any medical problems will certainly  contact Dr. Foy Guadalajara or one of his associates to follow along with during this  patient's hospitalization.      DLU/MEDQ  D:  02/20/2005  T:  02/20/2005  Job:  [id number]   cc:   Jeannett Senior A. Clent Ridges, M.D. [facility]

## 2011-02-21 NOTE — Op Note (Signed)
[name], [name]:  [id number]   MEDICAL RECORD NO.:  [id number]          PATIENT TYPE:  INP   LOCATION:  X008                         FACILITY:  [facility]   PHYSICIAN:  Erasmo Leventhal, M.D.DATE OF BIRTH:  [birth year]   DATE OF PROCEDURE:  12/01/2005  DATE OF DISCHARGE:                                 OPERATIVE REPORT   PREOPERATIVE DIAGNOSIS:  Left knee end-stage osteoarthritis.   POSTOPERATIVE DIAGNOSIS:  Left knee end-stage osteoarthritis.   PROCEDURE:  Left total knee arthroplasty.   SURGEON:  Erasmo Leventhal, M.D.   ASSISTANT:  Jaquelyn Bitter. Chabon, PA-C.   ANESTHESIA:  General with a femoral nerve block.   ESTIMATED BLOOD LOSS:  Less than 100 cc.   DRAINS:  Two medium Hemovac.   COMPLICATIONS:  None.   TOURNIQUET TIME:  An hour and 30 minutes at 350 mmHg.   COMPLICATIONS:  None.   DISPOSITION:  To the PACU stable.   OPERATIVE IMPLANTS:  Laural Benes & [facility] Sigma rotating platform, total  knee arthroplasty, size 3 femur, size 2.5 tibia, a 10 mm posterior  stabilized polyethylene  insert, and a 32 mm all polyethylene patella, all  cemented.   OPERATIVE DETAILS:  Patient counseled in the holding area.  The correct side  was identified.  The chart was reviewed and signed appropriately.  Taken to  OR, where the general anesthetic was administered.  Foley catheter was  placed, utilizing sterile technique, by the OR circulation nurse.  All  extremities were well padded and bumped.  She had a 5 degree flexion  contracture.  She could flex to 125 degrees.  She is elevated.  Prepped with  DuraPrep and draped in a sterile fashion.  Exsanguinated and Esmarch.  The  tourniquet was inflated to 350 mmHg.  A straight midline incision was made  through the skin and subcutaneous tissue, keeping the incision as small as  possible.  Medial and lateral soft tissue flaps were developed at the  appropriate levels.  Small vessels were coagulated  but preserving the skin  vasculature.  Medial parapatellar arthrotomy was performed.  End-stage  arthritis changes were encountered.  A proximal medial soft tissue release  was done through a varus knee.  The knee was flexed.  The patella was not  everted.  It was simply retracted out of the way, end-stage arthritis  changes.  Cruciate ligaments were resected.  A starting hole made in the  distal femur.  Canal was irrigated.  Effluent was clear.  Intramedullary rod  was gently placed.  I took a 5 degree valgus cut and took a 10 mm cut off  the distal femur.  The distal femur was found to be a size #3.  Rotational  marks were made.  The cutting block was applied.  The tibial eminence was  resected.  Medial and lateral menisci were removed under direct  visualization.  The posterior medial and posterior inferior geniculate  vessels were coagulated.  Posterior neurovascular structures were brought  off and protected throughout the entire case.  The tibia was subluxed and  found to  be a size 2.5.  A starting hole was made.  A stepper was utilized.  The canal was irrigated.  The effluent was clear.  The intramedullary rod  was gently placed.  We chose a 10 mm cut based upon the lateral side, which  was the least deficient side in a 0 degree slope.  This was done with  excellent coverage.  Posterior medial osteophytes were removed under direct  visualization.  With flexion and extension blocks, we were well balanced at  10 mm.  The tibial base plate was then applied, set for rotation and  coverage.  Found to be excellent.  Reamer and punch was then performed.  A  femoral box cut was next prepared.  For the size 3 femur, a 10 insert, 2.5  patella, and 2.5 tibia had excellent flexion and extension gaps.  Stable to  varus and valgus stress.  The patella was found to be a size 32.  The  appropriate amount was cut.  Locking holes were made.  With the patella  button this time, she had anatomic  patellofemoral tracking.  All trials were  then removed.  The knee was irrigated with pulsatile lavage.  The cement was  mixed utilizing modern cement technique.  At this time, utilizing excellent  cement technique, all components were cemented into place.  A size 2.5  tibia, a size 3 femur with a 10 trial insert and a 32 patella.  After the  cement had cured, all excess cement was removed.  With a 10 insert, we had  excellent range of motion and soft tissue balance.  Patellofemoral tracking  was anatomic and well balanced to varus and valgus stress and flexion and  extension gaps.  The trial was then removed.  The knee was copiously  irrigated.  Then a final 10 mm posterior stabilized tibial insert was  implanted.  The knee again was checked.  Final implants, excellent range of  motion, soft tissue balance, alignment, and tracking.  Two medium Hemovac  drains were placed.  Sequential closure of the layers were done.  The  arthrotomy was closed at 90 degrees of flexion, subcu Vicryl.  Skin was  closed with subcu Monocryl sutures.  Steri-Strips were applied.  The drain  was hooked to suction.  Sterile compressive dressing was applied to the  knee.  The tourniquet was deflated.  Normal circulation of the foot and  ankle at the end of the case.  Ancef 1 gm given intravenously.  She  tolerated the procedure well.  There were no complications.  We were well  pleased with the implant.  She was taken to the operating room and PACU in  stable condition.   To help with surgical decision-making and retraction throughout the entire  case, Mr. Brett Canales Chabon's, PA-C, assistance was needed.           ______________________________  Erasmo Leventhal, M.D.     RAC/MEDQ  D:  12/01/2005  T:  12/01/2005  Job:  [id number]

## 2011-11-03 ENCOUNTER — Ambulatory Visit (INDEPENDENT_AMBULATORY_CARE_PROVIDER_SITE_OTHER): Payer: Self-pay | Admitting: Surgery

## 2011-11-17 ENCOUNTER — Encounter (INDEPENDENT_AMBULATORY_CARE_PROVIDER_SITE_OTHER): Payer: Medicare Other | Admitting: General Surgery

## 2012-02-06 ENCOUNTER — Telehealth (INDEPENDENT_AMBULATORY_CARE_PROVIDER_SITE_OTHER): Payer: Self-pay

## 2012-02-06 NOTE — Telephone Encounter (Signed)
Attempted to contact pt to reschedule her appointment.  Msg states pt unavailable.  Pt needs to be rescheduled from 02/24/12 due to cancellation of p.m. Office by Dr. Abbey Chatters.

## 2012-02-19 ENCOUNTER — Other Ambulatory Visit: Payer: Self-pay | Admitting: Obstetrics and Gynecology

## 2012-02-19 ENCOUNTER — Other Ambulatory Visit (HOSPITAL_COMMUNITY)
Admission: RE | Admit: 2012-02-19 | Discharge: 2012-02-19 | Disposition: A | Discharge status: Home / Self Care | Payer: Medicare Other | Source: Ambulatory Visit | Attending: Obstetrics and Gynecology | Admitting: Obstetrics and Gynecology

## 2012-02-19 DIAGNOSIS — Z124 Encounter for screening for malignant neoplasm of cervix: Secondary | ICD-10-CM | POA: Insufficient documentation

## 2012-02-24 ENCOUNTER — Encounter (INDEPENDENT_AMBULATORY_CARE_PROVIDER_SITE_OTHER): Payer: Medicare Other | Admitting: General Surgery

## 2012-02-26 ENCOUNTER — Other Ambulatory Visit: Payer: Self-pay | Admitting: Obstetrics and Gynecology

## 2012-02-26 DIAGNOSIS — Z1231 Encounter for screening mammogram for malignant neoplasm of breast: Secondary | ICD-10-CM

## 2012-03-08 ENCOUNTER — Ambulatory Visit
Admission: RE | Admit: 2012-03-08 | Discharge: 2012-03-08 | Disposition: A | Discharge status: Home / Self Care | Payer: Medicare Other | Source: Ambulatory Visit | Attending: Obstetrics and Gynecology | Admitting: Obstetrics and Gynecology

## 2012-03-08 DIAGNOSIS — Z1231 Encounter for screening mammogram for malignant neoplasm of breast: Secondary | ICD-10-CM

## 2013-12-19 ENCOUNTER — Other Ambulatory Visit (HOSPITAL_BASED_OUTPATIENT_CLINIC_OR_DEPARTMENT_OTHER): Payer: Self-pay | Admitting: Physician Assistant

## 2013-12-19 DIAGNOSIS — M7989 Other specified soft tissue disorders: Secondary | ICD-10-CM

## 2013-12-20 ENCOUNTER — Ambulatory Visit (HOSPITAL_BASED_OUTPATIENT_CLINIC_OR_DEPARTMENT_OTHER)
Admission: RE | Admit: 2013-12-20 | Discharge: 2013-12-20 | Disposition: A | Discharge status: Home / Self Care | Payer: Medicare Other | Source: Ambulatory Visit | Attending: Physician Assistant | Admitting: Physician Assistant

## 2013-12-20 DIAGNOSIS — M7989 Other specified soft tissue disorders: Secondary | ICD-10-CM | POA: Insufficient documentation

## 2013-12-20 DIAGNOSIS — M7120 Synovial cyst of popliteal space [Baker], unspecified knee: Secondary | ICD-10-CM | POA: Insufficient documentation

## 2015-02-07 ENCOUNTER — Other Ambulatory Visit (HOSPITAL_COMMUNITY): Payer: Self-pay | Admitting: Family Medicine

## 2015-02-07 DIAGNOSIS — I350 Nonrheumatic aortic (valve) stenosis: Secondary | ICD-10-CM

## 2015-02-09 ENCOUNTER — Ambulatory Visit (HOSPITAL_COMMUNITY)
Admission: RE | Admit: 2015-02-09 | Discharge: 2015-02-09 | Disposition: A | Discharge status: Home / Self Care | Payer: Medicare PPO | Source: Ambulatory Visit | Attending: Family Medicine | Admitting: Family Medicine

## 2015-02-09 DIAGNOSIS — I350 Nonrheumatic aortic (valve) stenosis: Secondary | ICD-10-CM | POA: Diagnosis present

## 2015-02-09 NOTE — Progress Notes (Signed)
  Echocardiogram 2D Echocardiogram has been performed.  [name] 02/09/2015, 11:49 AM

## 2015-12-17 ENCOUNTER — Other Ambulatory Visit: Payer: Self-pay | Admitting: Cardiology

## 2015-12-17 ENCOUNTER — Ambulatory Visit
Admission: RE | Admit: 2015-12-17 | Discharge: 2015-12-17 | Disposition: A | Discharge status: Home / Self Care | Payer: BC Managed Care – PPO | Source: Ambulatory Visit | Attending: Cardiology | Admitting: Cardiology

## 2015-12-17 DIAGNOSIS — R0602 Shortness of breath: Secondary | ICD-10-CM

## 2017-01-28 ENCOUNTER — Other Ambulatory Visit: Payer: Self-pay | Admitting: Gastroenterology

## 2017-01-28 DIAGNOSIS — R1310 Dysphagia, unspecified: Secondary | ICD-10-CM

## 2017-01-29 ENCOUNTER — Other Ambulatory Visit: Payer: Medicare PPO

## 2017-01-30 ENCOUNTER — Other Ambulatory Visit: Payer: Medicare PPO

## 2017-02-03 ENCOUNTER — Ambulatory Visit
Admission: RE | Admit: 2017-02-03 | Discharge: 2017-02-03 | Disposition: A | Discharge status: Home / Self Care | Payer: Medicare Other | Source: Ambulatory Visit | Attending: Gastroenterology | Admitting: Gastroenterology

## 2017-02-03 ENCOUNTER — Ambulatory Visit
Admission: RE | Admit: 2017-02-03 | Discharge: 2017-02-03 | Disposition: A | Discharge status: Home / Self Care | Payer: Medicare PPO | Source: Ambulatory Visit | Attending: Gastroenterology | Admitting: Gastroenterology

## 2017-02-03 ENCOUNTER — Other Ambulatory Visit: Payer: Self-pay | Admitting: Gastroenterology

## 2017-02-03 DIAGNOSIS — R1310 Dysphagia, unspecified: Secondary | ICD-10-CM

## 2017-02-03 DIAGNOSIS — R05 Cough: Secondary | ICD-10-CM

## 2017-02-03 DIAGNOSIS — R059 Cough, unspecified: Secondary | ICD-10-CM

## 2017-02-06 ENCOUNTER — Other Ambulatory Visit: Payer: Medicare PPO

## 2017-02-06 ENCOUNTER — Encounter (HOSPITAL_COMMUNITY): Payer: Self-pay | Admitting: *Deleted

## 2017-02-06 ENCOUNTER — Emergency Department (HOSPITAL_COMMUNITY): Payer: Medicare Other

## 2017-02-06 ENCOUNTER — Inpatient Hospital Stay (HOSPITAL_COMMUNITY)
Admission: EM | Admit: 2017-02-06 | Discharge: 2017-02-10 | DRG: 871 | Disposition: A | Discharge status: Home Health | Payer: Medicare Other | Attending: Internal Medicine | Admitting: Internal Medicine

## 2017-02-06 DIAGNOSIS — Z7952 Long term (current) use of systemic steroids: Secondary | ICD-10-CM | POA: Diagnosis not present

## 2017-02-06 DIAGNOSIS — Z79899 Other long term (current) drug therapy: Secondary | ICD-10-CM

## 2017-02-06 DIAGNOSIS — R1310 Dysphagia, unspecified: Secondary | ICD-10-CM | POA: Diagnosis not present

## 2017-02-06 DIAGNOSIS — M1990 Unspecified osteoarthritis, unspecified site: Secondary | ICD-10-CM | POA: Diagnosis present

## 2017-02-06 DIAGNOSIS — J189 Pneumonia, unspecified organism: Secondary | ICD-10-CM | POA: Insufficient documentation

## 2017-02-06 DIAGNOSIS — K224 Dyskinesia of esophagus: Secondary | ICD-10-CM | POA: Diagnosis present

## 2017-02-06 DIAGNOSIS — A419 Sepsis, unspecified organism: Principal | ICD-10-CM | POA: Diagnosis present

## 2017-02-06 DIAGNOSIS — J181 Lobar pneumonia, unspecified organism: Secondary | ICD-10-CM

## 2017-02-06 DIAGNOSIS — R1314 Dysphagia, pharyngoesophageal phase: Secondary | ICD-10-CM | POA: Diagnosis present

## 2017-02-06 DIAGNOSIS — N179 Acute kidney failure, unspecified: Secondary | ICD-10-CM | POA: Diagnosis present

## 2017-02-06 DIAGNOSIS — D473 Essential (hemorrhagic) thrombocythemia: Secondary | ICD-10-CM | POA: Diagnosis present

## 2017-02-06 DIAGNOSIS — Z66 Do not resuscitate: Secondary | ICD-10-CM | POA: Diagnosis present

## 2017-02-06 DIAGNOSIS — K581 Irritable bowel syndrome with constipation: Secondary | ICD-10-CM | POA: Diagnosis present

## 2017-02-06 DIAGNOSIS — Z886 Allergy status to analgesic agent status: Secondary | ICD-10-CM | POA: Diagnosis not present

## 2017-02-06 DIAGNOSIS — Z885 Allergy status to narcotic agent status: Secondary | ICD-10-CM | POA: Diagnosis not present

## 2017-02-06 DIAGNOSIS — D649 Anemia, unspecified: Secondary | ICD-10-CM | POA: Diagnosis present

## 2017-02-06 DIAGNOSIS — Z79891 Long term (current) use of opiate analgesic: Secondary | ICD-10-CM | POA: Diagnosis not present

## 2017-02-06 DIAGNOSIS — J690 Pneumonitis due to inhalation of food and vomit: Secondary | ICD-10-CM | POA: Diagnosis present

## 2017-02-06 HISTORY — DX: Unspecified osteoarthritis, unspecified site: M19.90

## 2017-02-06 HISTORY — DX: Irritable bowel syndrome without diarrhea: K58.9

## 2017-02-06 LAB — COMPREHENSIVE METABOLIC PANEL
ALT: 10 U/L — ABNORMAL LOW (ref 14–54)
ANION GAP: 10 (ref 5–15)
AST: 15 U/L (ref 15–41)
Albumin: 2.7 g/dL — ABNORMAL LOW (ref 3.5–5.0)
Alkaline Phosphatase: 95 U/L (ref 38–126)
BUN: 19 mg/dL (ref 6–20)
CHLORIDE: 97 mmol/L — AB (ref 101–111)
CO2: 26 mmol/L (ref 22–32)
Calcium: 8.9 mg/dL (ref 8.9–10.3)
Creatinine, Ser: 1.18 mg/dL — ABNORMAL HIGH (ref 0.44–1.00)
GFR calc non Af Amer: 41 mL/min — ABNORMAL LOW (ref >60)
GFR, EST AFRICAN AMERICAN: 47 mL/min — AB (ref >60)
Glucose, Bld: 121 mg/dL — ABNORMAL HIGH (ref 65–99)
POTASSIUM: 4.1 mmol/L (ref 3.5–5.1)
Sodium: 133 mmol/L — ABNORMAL LOW (ref 135–145)
Total Bilirubin: 0.4 mg/dL (ref 0.3–1.2)
Total Protein: 6 g/dL — ABNORMAL LOW (ref 6.5–8.1)

## 2017-02-06 LAB — CBC WITH DIFFERENTIAL/PLATELET
Basophils Absolute: 0 10*3/uL (ref 0.0–0.1)
Basophils Relative: 0 %
EOS ABS: 0.1 10*3/uL (ref 0.0–0.7)
Eosinophils Relative: 0 %
HCT: 28.9 % — ABNORMAL LOW (ref 36.0–46.0)
HEMOGLOBIN: 9.6 g/dL — AB (ref 12.0–15.0)
LYMPHS PCT: 11 %
Lymphs Abs: 1.4 10*3/uL (ref 0.7–4.0)
MCH: 33.2 pg (ref 26.0–34.0)
MCHC: 33.2 g/dL (ref 30.0–36.0)
MCV: 100 fL (ref 78.0–100.0)
Monocytes Absolute: 1.3 10*3/uL — ABNORMAL HIGH (ref 0.1–1.0)
Monocytes Relative: 10 %
NEUTROS PCT: 79 %
Neutro Abs: 9.8 10*3/uL — ABNORMAL HIGH (ref 1.7–7.7)
Platelets: 669 10*3/uL — ABNORMAL HIGH (ref 150–400)
RBC: 2.89 MIL/uL — AB (ref 3.87–5.11)
RDW: 12.5 % (ref 11.5–15.5)
WBC: 12.6 10*3/uL — AB (ref 4.0–10.5)

## 2017-02-06 LAB — PROTIME-INR
INR: 1.17
PROTHROMBIN TIME: 14.9 s (ref 11.4–15.2)

## 2017-02-06 LAB — I-STAT CG4 LACTIC ACID, ED
Lactic Acid, Venous: 1.8 mmol/L (ref 0.5–1.9)
Lactic Acid, Venous: 1.22 mmol/L (ref 0.5–1.9)

## 2017-02-06 MED ORDER — CEFTRIAXONE SODIUM 1 G IJ SOLR: 1.0000 g | Freq: Once | INTRAMUSCULAR | Status: DC

## 2017-02-06 MED ORDER — PIPERACILLIN-TAZOBACTAM 3.375 G IVPB
3.3750 g | Freq: Three times a day (TID) | INTRAVENOUS | Status: DC
  Administered 2017-02-06 – 2017-02-10 (×10): 3.375 g via INTRAVENOUS
  Filled 2017-02-06 (×12): qty 50

## 2017-02-06 MED ORDER — SODIUM CHLORIDE 0.9 % IV BOLUS (SEPSIS)
1000.0000 mL | Freq: Once | INTRAVENOUS | Status: AC
  Administered 2017-02-06: 1000 mL via INTRAVENOUS

## 2017-02-06 MED ORDER — SODIUM CHLORIDE 0.9 % IV SOLN
INTRAVENOUS | Status: DC
  Administered 2017-02-06: 18:00:00 via INTRAVENOUS

## 2017-02-06 MED ORDER — ENOXAPARIN SODIUM 30 MG/0.3ML ~~LOC~~ SOLN
30.0000 mg | SUBCUTANEOUS | Status: DC
  Administered 2017-02-06 – 2017-02-09 (×4): 30 mg via SUBCUTANEOUS
  Filled 2017-02-06 (×4): qty 0.3

## 2017-02-06 MED ORDER — HYDROCODONE-ACETAMINOPHEN 7.5-325 MG PO TABS
1.0000 | ORAL_TABLET | Freq: Four times a day (QID) | ORAL | Status: DC | PRN
  Administered 2017-02-07: 1 via ORAL
  Filled 2017-02-06: qty 1

## 2017-02-06 MED ORDER — DULOXETINE HCL 60 MG PO CPEP
60.0000 mg | ORAL_CAPSULE | Freq: Every day | ORAL | Status: DC
  Administered 2017-02-06 – 2017-02-10 (×3): 60 mg via ORAL
  Filled 2017-02-06 (×3): qty 1

## 2017-02-06 MED ORDER — PIPERACILLIN-TAZOBACTAM 3.375 G IVPB 30 MIN
3.3750 g | Freq: Once | INTRAVENOUS | Status: AC
  Administered 2017-02-06: 3.375 g via INTRAVENOUS
  Filled 2017-02-06: qty 50

## 2017-02-06 MED ORDER — SODIUM CHLORIDE 0.9 % IV BOLUS (SEPSIS)
500.0000 mL | Freq: Once | INTRAVENOUS | Status: AC
  Administered 2017-02-06: 500 mL via INTRAVENOUS

## 2017-02-06 MED ORDER — PANTOPRAZOLE SODIUM 40 MG PO TBEC
40.0000 mg | DELAYED_RELEASE_TABLET | Freq: Two times a day (BID) | ORAL | Status: DC
  Administered 2017-02-06 – 2017-02-08 (×4): 40 mg via ORAL
  Filled 2017-02-06 (×4): qty 1

## 2017-02-06 MED ORDER — DEXTROSE 5 % IV SOLN: 1.0000 g | INTRAVENOUS | Status: DC

## 2017-02-06 MED ORDER — TRIAZOLAM 0.25 MG PO TABS
0.2500 mg | ORAL_TABLET | Freq: Every evening | ORAL | Status: DC | PRN
  Administered 2017-02-06 – 2017-02-09 (×4): 0.25 mg via ORAL
  Filled 2017-02-06: qty 2
  Filled 2017-02-06: qty 1
  Filled 2017-02-06: qty 2
  Filled 2017-02-06 (×3): qty 1
  Filled 2017-02-06 (×2): qty 2

## 2017-02-06 MED ORDER — DEXTROSE 5 % IV SOLN: 500.0000 mg | Freq: Once | INTRAVENOUS | Status: DC

## 2017-02-06 MED ORDER — DEXTROSE 5 % IV SOLN: 500.0000 mg | INTRAVENOUS | Status: DC

## 2017-02-06 NOTE — ED Provider Notes (Signed)
[facility] DEPT Provider Note   CSN: [id number] Arrival date & time: 02/06/17  1315     History   Chief Complaint Chief Complaint  Patient presents with  . Cough  . Pneumonia    HPI CLEOTA PELLERITO is a 83 y.o. female.Complains of cough productive of yellow sputum with flecks of blood onset approximately 2 weeks ago and generalized progressive weakness. Seen by her PMD on 02/03/2017 started on antibiotic which she thinks is Levaquin which she's been taking without relief. She denies any shortness of breath however does complain of generalized weakness. Weakness is worse with standing and improved with lying down. No other associated symptoms. Patient has been on a two-week course of prednisone which was discontinued a week ago for osteoarthritis  HPI  Past Medical History:  Diagnosis Date  . Arthritis   . IBS (irritable bowel syndrome)     There are no active problems to display for this patient.   History reviewed. No pertinent surgical history.  OB History    No data available       Home Medications    Prior to Admission medications   Not on File    Family History History reviewed. No pertinent family history.  Social History Social History  Substance Use Topics  . Smoking status: Not on file  . Smokeless tobacco: Not on file  . Alcohol use Not on file  No tobacco occasional alcohol no drug use Allergies   Codeine   Review of Systems Review of Systems  HENT: Negative.   Respiratory: Positive for cough.   Cardiovascular: Negative.   Gastrointestinal: Negative.        Denies blood per rectum or black stool  Musculoskeletal: Negative.   Skin: Negative.   Neurological: Positive for weakness.  Psychiatric/Behavioral: Negative.   All other systems reviewed and are negative.    Physical Exam Updated Vital Signs BP (!) 89/56   Pulse (!) 101   Temp 98.3 F (36.8 C) (Oral)   Resp 16   Ht 5\' 2"  (1.575 m)   Wt 109 lb (49.4 kg)   SpO2 96%    BMI 19.94 kg/m   Physical Exam  Constitutional: She is oriented to person, place, and time.  Chronically ill-appearing, frail  HENT:  Head: Normocephalic and atraumatic.  Mucous membranes dry  Eyes: Conjunctivae are normal. Pupils are equal, round, and reactive to light.  Neck: Neck supple. No tracheal deviation present. No thyromegaly present.  Cardiovascular: Normal rate and regular rhythm.   No murmur heard. Pulmonary/Chest: Effort normal and breath sounds normal.  Abdominal: Soft. Bowel sounds are normal. She exhibits no distension. There is no tenderness.  Musculoskeletal: Normal range of motion. She exhibits no edema or tenderness.  Thoracic kyphosis  Neurological: She is alert and oriented to person, place, and time. Coordination normal.  Skin: Skin is warm and dry. No rash noted.  Psychiatric: She has a normal mood and affect.  Nursing note and vitals reviewed.    ED Treatments / Results  Labs (all labs ordered are listed, but only abnormal results are displayed) Labs Reviewed  COMPREHENSIVE METABOLIC PANEL - Abnormal; Notable for the following:       Result Value   Sodium 133 (*)    Chloride 97 (*)    Glucose, Bld 121 (*)    Creatinine, Ser 1.18 (*)    Total Protein 6.0 (*)    Albumin 2.7 (*)    ALT 10 (*)    GFR calc non  Af Amer 41 (*)    GFR calc Af Amer 47 (*)    All other components within normal limits  CBC WITH DIFFERENTIAL/PLATELET - Abnormal; Notable for the following:    WBC 12.6 (*)    RBC 2.89 (*)    Hemoglobin 9.6 (*)    HCT 28.9 (*)    Platelets 669 (*)    Neutro Abs 9.8 (*)    Monocytes Absolute 1.3 (*)    All other components within normal limits  CULTURE, BLOOD (ROUTINE X 2)  CULTURE, BLOOD (ROUTINE X 2)  PROTIME-INR  URINALYSIS, ROUTINE W REFLEX MICROSCOPIC  I-STAT CG4 LACTIC ACID, ED  I-STAT CG4 LACTIC ACID, ED   Results for orders placed or performed during the hospital encounter of 02/06/17  Comprehensive metabolic panel  Result  Value Ref Range   Sodium 133 (L) 135 - 145 mmol/L   Potassium 4.1 3.5 - 5.1 mmol/L   Chloride 97 (L) 101 - 111 mmol/L   CO2 26 22 - 32 mmol/L   Glucose, Bld 121 (H) 65 - 99 mg/dL   BUN 19 6 - 20 mg/dL   Creatinine, Ser 1.18 (H) 0.44 - 1.00 mg/dL   Calcium 8.9 8.9 - 10.3 mg/dL   Total Protein 6.0 (L) 6.5 - 8.1 g/dL   Albumin 2.7 (L) 3.5 - 5.0 g/dL   AST 15 15 - 41 U/L   ALT 10 (L) 14 - 54 U/L   Alkaline Phosphatase 95 38 - 126 U/L   Total Bilirubin 0.4 0.3 - 1.2 mg/dL   GFR calc non Af Amer 41 (L) >60 mL/min   GFR calc Af Amer 47 (L) >60 mL/min   Anion gap 10 5 - 15  CBC with Differential  Result Value Ref Range   WBC 12.6 (H) 4.0 - 10.5 K/uL   RBC 2.89 (L) 3.87 - 5.11 MIL/uL   Hemoglobin 9.6 (L) 12.0 - 15.0 g/dL   HCT 28.9 (L) 36.0 - 46.0 %   MCV 100.0 78.0 - 100.0 fL   MCH 33.2 26.0 - 34.0 pg   MCHC 33.2 30.0 - 36.0 g/dL   RDW 12.5 11.5 - 15.5 %   Platelets 669 (H) 150 - 400 K/uL   Neutrophils Relative % 79 %   Neutro Abs 9.8 (H) 1.7 - 7.7 K/uL   Lymphocytes Relative 11 %   Lymphs Abs 1.4 0.7 - 4.0 K/uL   Monocytes Relative 10 %   Monocytes Absolute 1.3 (H) 0.1 - 1.0 K/uL   Eosinophils Relative 0 %   Eosinophils Absolute 0.1 0.0 - 0.7 K/uL   Basophils Relative 0 %   Basophils Absolute 0.0 0.0 - 0.1 K/uL  Protime-INR  Result Value Ref Range   Prothrombin Time 14.9 11.4 - 15.2 seconds   INR 1.17   I-Stat CG4 Lactic Acid, ED  Result Value Ref Range   Lactic Acid, Venous 1.80 0.5 - 1.9 mmol/L  I-Stat CG4 Lactic Acid, ED  Result Value Ref Range   Lactic Acid, Venous 1.22 0.5 - 1.9 mmol/L   Dg Chest 2 View  Result Date: 02/06/2017 CLINICAL DATA:  Cough and hypotension.  Pneumonia. EXAM: CHEST  2 VIEW COMPARISON:  Three days ago FINDINGS: Confluent airspace disease at the left base without convincing progression from prior. Trace pleural effusions. No edema or pneumothorax. Normal heart size and aortic contours. IMPRESSION: 1. Presumed pneumonia on the left without change  from prior. Recommend follow-up to clearing. 2. Trace pleural effusions. 3. Oral contrast in the  gas distended colon, administered 3 days prior. Electronically Signed   By: Monte Fantasia M.D.   On: 02/06/2017 14:31   Dg Chest 2 View  Result Date: 02/03/2017 CLINICAL DATA:  Persistent productive cough and chest congestion with wheezing. EXAM: CHEST  2 VIEW COMPARISON:  PA and lateral chest x-ray of December 17, 2015 FINDINGS: The lungs are well-expanded. There is dense infiltrate in the anterior aspect of the left lower lobe. There is a tiny left pleural effusion. The right lung is clear. The heart is top-normal in size but stable. The pulmonary vascularity is normal. There is calcification in the wall of the aortic arch. The mediastinum is normal in width. The patient has undergone lower thoracic and lumbar spinal fusion there are degenerative changes of both shoulders. There is contrast within bowel loops in the upper and mid abdomen. IMPRESSION: Left lower lobe pneumonia. Followup PA and lateral chest X-ray is recommended in 3-4 weeks following trial of antibiotic therapy to ensure resolution and exclude underlying malignancy. Thoracic aortic atherosclerosis. Electronically Signed   By: David  Martinique M.D.   On: 02/03/2017 10:57   Dg Esophagus  Result Date: 02/03/2017 CLINICAL DATA:  Sensation of food set sticking in the high esophagus. Strangulation of water. EXAM: ESOPHOGRAM/BARIUM SWALLOW TECHNIQUE: Single contrast examination was performed using thin barium or water soluble. FLUOROSCOPY TIME:  Fluoroscopy Time:  2 minutes 30 seconds Radiation Exposure Index (if provided by the fluoroscopic device): 63 mGy Number of Acquired Spot Images: 15 COMPARISON:  None. FINDINGS: Exam limited due to patient limited mobility. No swallowing dysfunction in the high cervical esophagus. No mucosal irregularity stricture or mass within the thoracic esophagus or distal esophagus. With patient prone position, there was poor  initiation of the primary stripping wave. No tertiary contractions. No gastroesophageal reflux demonstrated. 13 mm barium tab passed GE junction easily IMPRESSION: 1. Esophageal dysmotility with poor initiation of the primary stripping wave. No tertiary contractions. 2. No esophageal mucosal irregularity, stricture or mass. 3. Barium tablet passed the GE junction easily. 4. No reflux demonstrated during the course exam. Of note exam was limited by patient's limited mobility. Electronically Signed   By: Suzy Bouchard M.D.   On: 02/03/2017 10:41   EKG  EKG Interpretation  Date/Time:  Friday Feb 06 2017 17:22:24 EDT Ventricular Rate:  104 PR Interval:    QRS Duration: 128 QT Interval:  371 QTC Calculation: 488 R Axis:   64 Text Interpretation:  Sinus tachycardia Ventricular premature complex Right bundle branch block No significant change since last tracing Confirmed by Winfred Leeds  MD, Tysheena Ginzburg [phone]) on 02/06/2017 5:28:49 PM      Chest x-ray viewed by me Radiology Dg Chest 2 View  Result Date: 02/06/2017 CLINICAL DATA:  Cough and hypotension.  Pneumonia. EXAM: CHEST  2 VIEW COMPARISON:  Three days ago FINDINGS: Confluent airspace disease at the left base without convincing progression from prior. Trace pleural effusions. No edema or pneumothorax. Normal heart size and aortic contours. IMPRESSION: 1. Presumed pneumonia on the left without change from prior. Recommend follow-up to clearing. 2. Trace pleural effusions. 3. Oral contrast in the gas distended colon, administered 3 days prior. Electronically Signed   By: Monte Fantasia M.D.   On: 02/06/2017 14:31    Procedures Procedures (including critical care time)  Medications Ordered in ED Medications - No data to display  Code sepsis called based on Sirs criteria of tachycardia, hypotension. Presumed source of infection respiratory. IV fluids and antibiotics ordered Dr. Broadus John consulted from  hospital service will evaluate patient in ED and  arrange for admission Initial Impression / Assessment and Plan / ED Course  I have reviewed the triage vital signs and the nursing notes.  Pertinent labs & imaging results that were available during my care of the patient were reviewed by me and considered in my medical decision making (see chart for details).      Final Clinical Impressions(s) / ED Diagnoses  Diagnosis #1 community acquired pneumonia #2 anemia #3 sepsis #4 hypotension Final diagnoses:  None  #5 thrombocytosis  New Prescriptions New Prescriptions   No medications on file     Orlie Dakin, MD 02/06/17 1730

## 2017-02-06 NOTE — H&P (Signed)
[facility] History and Physical  [name] PNT:[id number] DOB: 09/20/1930 DOA: 02/06/2017  Referring physician: EDP PCP: Orpah Melter, MD   Chief Complaint: weakness [name]  HPI: [name] is a 83 y.o. female with PMh of osteoarthritis, IBS with constipation, ongoing issues with dysphagia recently presents to the emergency room with the above complaint. Patient reports that she has had trouble with her swallowing for the last several months, she notes dysphagia liquids as well as solids,  especially with harder texture solids, this has affected her PO intake and resulted in weight loss. She just saw Dr. Teena Irani with [facility] recently who did an esophagram which is notable for esophageal dysmotility and no obstruction. Around the same time she started having cough productive of occasional streaks of blood, she saw her primary care doctor on 5/1 diagnosed her with pneumonia and she was subsequently started on Levaquin which he started the following day i.e. 5/2. Despite this continued to have profound weakness requiring assistance with ADLs and hence was brought to the emergency room by her family today.  ED course: Blood pressure in the 80s on arrival with mild tachycardia which responded to fluid bolus, labs notable for mild leukocytosis, thrombocytosis, mild acute kidney injury and chest x-ray notable for left lower lobe pneumonia. Lactate at 1.8   Review of Systems: All systems reviewed positives as noted above   Past Medical History:  Diagnosis Date  . Arthritis   . IBS (irritable bowel syndrome)    History reviewed. No pertinent surgical history. Social History:  has no tobacco, alcohol, and drug history on file.  Allergies  Allergen Reactions  . Codeine      family history. -No history of colon cancer or esophageal cancer in the family  Prior to Admission medications   Medication Sig Start Date End Date Taking? Authorizing Provider  celecoxib  (CELEBREX) 200 MG capsule Take 200 mg by mouth 2 (two) times daily. 01/10/17  Yes [provider]  estrogen, conjugated,-medroxyprogesterone (PREMPRO) 0.3-1.5 MG tablet Take 1 tablet by mouth daily.   Yes [provider]  HYDROcodone-acetaminophen (NORCO) 7.5-325 MG tablet Take 1 tablet by mouth every 6 (six) hours as needed (pain).  01/23/17  Yes [provider]  pantoprazole (PROTONIX) 40 MG tablet Take 40 mg by mouth daily. 01/19/17  Yes [provider]  phentermine 15 MG capsule Take 15 mg by mouth 2 (two) times daily. 01/12/17  Yes [provider]  benzonatate (TESSALON) 100 MG capsule Take 100 mg by mouth 3 (three) times daily as needed for cough.  02/03/17   [provider]  DULoxetine (CYMBALTA) 60 MG capsule Take 60 mg by mouth daily. 12/22/16   [provider]  hydrocortisone 2.5 % cream Apply 1 application topically 2 (two) times daily. Apply to rash on neck 11/06/16   [provider]  levofloxacin (LEVAQUIN) 750 MG tablet Take 750 mg by mouth daily. 10 day course filled 02/03/17 02/03/17   [provider]  predniSONE (DELTASONE) 10 MG tablet Take 10-30 mg by mouth See admin instructions. Tapered course filled 12/25/16: Take 3 tablets (30 mg) by mouth daily for one week, then take 2 tablets (20 mg) daily for one week, then take 1 tablet (10 mg) daily for one week, then stop 12/25/16   [provider]  triazolam (HALCION) 0.25 MG tablet Take 0.25 mg by mouth at bedtime. 01/22/17   [provider]   Physical Exam: Vitals:   02/06/17 1343 02/06/17  1344 02/06/17 1542 02/06/17 1725  BP: (!) 91/46  (!) 89/56 106/71  Pulse:   (!) 101 (!) 105  Resp:   16 16  Temp:      TempSrc:      SpO2:   96% 100%  Weight:  49.4 kg (109 lb)    Height:  5\' 2"  (1.575 m)      Wt Readings from Last 3 Encounters:  02/06/17 49.4 kg (109 lb)    General:  Frail thinly built female laying in bed, no distress Eyes: PERRL,  ENT:  grossly normal lips and tongue Neck: no LAD, masses Cardiovascular: RRR, no m/r/g. No LE edema. Respiratory: Rhonchi at the left base, rest clear Abdomen: soft, slightly distended, bowel sounds present, nontender Skin: no rash or induration seen on limited exam Musculoskeletal: grossly normal tone BUE/BLE Psychiatric: grossly normal mood and affect, speech fluent and appropriate Neurologic: grossly non-focal.          Labs on Admission:  Basic Metabolic Panel:  Recent Labs Lab 02/06/17 1408  NA 133*  K 4.1  CL 97*  CO2 26  GLUCOSE 121*  BUN 19  CREATININE 1.18*  CALCIUM 8.9   Liver Function Tests:  Recent Labs Lab 02/06/17 1408  AST 15  ALT 10*  ALKPHOS 95  BILITOT 0.4  PROT 6.0*  ALBUMIN 2.7*   No results for input(s): LIPASE, AMYLASE in the last 168 hours. No results for input(s): AMMONIA in the last 168 hours. CBC:  Recent Labs Lab 02/06/17 1408  WBC 12.6*  NEUTROABS 9.8*  HGB 9.6*  HCT 28.9*  MCV 100.0  PLT 669*   Cardiac Enzymes: No results for input(s): CKTOTAL, CKMB, CKMBINDEX, TROPONINI in the last 168 hours.  BNP (last 3 results) No results for input(s): BNP in the last 8760 hours.  ProBNP (last 3 results) No results for input(s): PROBNP in the last 8760 hours.  CBG: No results for input(s): GLUCAP in the last 168 hours.  Radiological Exams on Admission: Dg Chest 2 View  Result Date: 02/06/2017 CLINICAL DATA:  Cough and hypotension.  Pneumonia. EXAM: CHEST  2 VIEW COMPARISON:  Three days ago FINDINGS: Confluent airspace disease at the left base without convincing progression from prior. Trace pleural effusions. No edema or pneumothorax. Normal heart size and aortic contours. IMPRESSION: 1. Presumed pneumonia on the left without change from prior. Recommend follow-up to clearing. 2. Trace pleural effusions. 3. Oral contrast in the gas distended colon, administered 3 days prior. Electronically Signed   By: Monte Fantasia M.D.   On: 02/06/2017  14:31    Assessment/Plan Active Problems:     Sepsis due to undetermined organism ([place]) -Due to suspected aspiration pneumonia, due to ongoing dysphagia  -Improved, nontoxic at this time, BP stabilized -Start IV Zosyn -Follow blood cultures -Lactate is reassuring  Aspiration pneumonia -As above, SLP evaluation -Esophagram from 5/1 notable for esophageal dysmotility, No esophageal mucosal irregularity, stricture or mass   Dysphagia -As above -Depending on SLP evaluation and clinical progression, may need GI input -treatment options limited      Osteoarthritis -resume home regimen of oxycodone  Thrombocytosis -I suspect this is Reactive from pneumonia/sepsis -Trend  Medication reconciliation not completed, family to provide complete home medication list once available  Code Status: DNR DVT Prophylaxis:lovenox Family Communication: son at bedside Disposition Plan: Home in few days, may need short term SNF depending on progress  Time spent: 106min  Matheson Vandehei [facility] Pager [phone]

## 2017-02-06 NOTE — ED Notes (Signed)
Warm blanket given. Updated pt. On plan of care.  Pt. verbalized understanding.Marland Kitchen

## 2017-02-06 NOTE — ED Triage Notes (Signed)
Pt reports being sent here from pcp office for pneumonia. Had xray done on 5/1 and reports taking meds as prescribed. Has generalized fatigue, weakness, cough with yellow/brown sputum. Denies fever.

## 2017-02-06 NOTE — Progress Notes (Signed)
Pharmacy Antibiotic Note  [name] is a 83 y.o. female admitted on 02/06/2017 with aspiration pneumonia.  Pharmacy has been consulted for zosyn dosing. Pt is afebrile and WBC is elevated at 12.6. SCr is 1.18 and lactic acid I <2.   Plan: Zosyn 3.375gm IV Q8H (4 hr inf) F/u renal fxn, C&S, clinical status  Height: 5\' 2"  (157.5 cm) Weight: 109 lb (49.4 kg) IBW/kg (Calculated) : 50.1  Temp (24hrs), Avg:98.3 F (36.8 C), Min:98.3 F (36.8 C), Max:98.3 F (36.8 C)   Recent Labs Lab 02/06/17 1408 02/06/17 1432 02/06/17 1718  WBC 12.6*  --   --   CREATININE 1.18*  --   --   LATICACIDVEN  --  1.80 1.22    Estimated Creatinine Clearance: 27.2 mL/min (A) (by C-G formula based on SCr of 1.18 mg/dL (H)).    Allergies  Allergen Reactions  . Codeine     Antimicrobials this admission: Zosyn 5/4>>  Dose adjustments this admission: N/A  Microbiology results: Pending  Thank you for allowing pharmacy to be a part of this patient's care.  Miku Udall, Rande Lawman 02/06/2017 5:46 PM

## 2017-02-07 ENCOUNTER — Encounter (HOSPITAL_COMMUNITY): Payer: Self-pay | Admitting: *Deleted

## 2017-02-07 LAB — BASIC METABOLIC PANEL
Anion gap: 10 (ref 5–15)
BUN: 13 mg/dL (ref 6–20)
CHLORIDE: 99 mmol/L — AB (ref 101–111)
CO2: 24 mmol/L (ref 22–32)
CREATININE: 1.05 mg/dL — AB (ref 0.44–1.00)
Calcium: 8.4 mg/dL — ABNORMAL LOW (ref 8.9–10.3)
GFR calc Af Amer: 55 mL/min — ABNORMAL LOW (ref >60)
GFR calc non Af Amer: 47 mL/min — ABNORMAL LOW (ref >60)
GLUCOSE: 99 mg/dL (ref 65–99)
POTASSIUM: 3.9 mmol/L (ref 3.5–5.1)
Sodium: 133 mmol/L — ABNORMAL LOW (ref 135–145)

## 2017-02-07 LAB — CBC
HEMATOCRIT: 25 % — AB (ref 36.0–46.0)
HEMOGLOBIN: 8.3 g/dL — AB (ref 12.0–15.0)
MCH: 32.9 pg (ref 26.0–34.0)
MCHC: 33.2 g/dL (ref 30.0–36.0)
MCV: 99.2 fL (ref 78.0–100.0)
Platelets: 585 10*3/uL — ABNORMAL HIGH (ref 150–400)
RBC: 2.52 MIL/uL — AB (ref 3.87–5.11)
RDW: 12.7 % (ref 11.5–15.5)
WBC: 10.3 10*3/uL (ref 4.0–10.5)

## 2017-02-07 MED ORDER — HYDROCODONE-ACETAMINOPHEN 7.5-325 MG PO TABS
1.0000 | ORAL_TABLET | ORAL | Status: DC | PRN
  Administered 2017-02-07 – 2017-02-10 (×10): 1 via ORAL
  Filled 2017-02-07 (×11): qty 1

## 2017-02-07 MED ORDER — SODIUM CHLORIDE 0.9 % IV SOLN
INTRAVENOUS | Status: AC
  Administered 2017-02-07: 09:00:00 via INTRAVENOUS

## 2017-02-07 MED ORDER — ROPINIROLE HCL 0.25 MG PO TABS
0.2500 mg | ORAL_TABLET | Freq: Once | ORAL | Status: AC
  Administered 2017-02-07: 0.25 mg via ORAL
  Filled 2017-02-07: qty 1

## 2017-02-07 MED ORDER — ALPRAZOLAM 0.25 MG PO TABS
0.2500 mg | ORAL_TABLET | Freq: Once | ORAL | Status: AC
  Administered 2017-02-07: 0.25 mg via ORAL
  Filled 2017-02-07: qty 1

## 2017-02-07 MED ORDER — GUAIFENESIN-DM 100-10 MG/5ML PO SYRP
5.0000 mL | ORAL_SOLUTION | ORAL | Status: DC | PRN
  Administered 2017-02-07: 5 mL via ORAL
  Filled 2017-02-07: qty 5

## 2017-02-07 NOTE — Progress Notes (Signed)
PROGRESS NOTE    [name]  HUD:[id number] DOB: November 19, 1929 DOA: 02/06/2017 PCP: Orpah Melter, MD  Brief Narrative:[name] is a 83 y.o. female with PMh of osteoarthritis, IBS with constipation, ongoing issues with dysphagia, she has had trouble with her swallowing for the last several months, she notes dysphagia liquids as well as solids,  especially with harder texture solids, this has affected her PO intake and resulted in weight loss. She just saw Dr. Teena Irani with [name] recently who did an esophagram which is notable for esophageal dysmotility and no obstruction. Around the same time she started having cough productive of occasional streaks of blood, she saw her primary care doctor on 5/1 diagnosed her with pneumonia and she was subsequently started on Levaquin .Despite this continued to have profound weakness requiring assistance with ADLs and hence was brought to the ED. ED course: Blood pressure in the 80s on arrival with mild tachycardia which responded to fluid bolus, labs notable for mild leukocytosis, thrombocytosis, mild acute kidney injury and chest x-ray notable for left lower lobe pneumonia. Lactate at 1.8   Assessment & Plan:   Sepsis due to undetermined organism [facility]) -Due to suspected aspiration pneumonia, due to ongoing dysphagia  -Improved, sepsis physiology reoslved -continue IV Zosyn day2 -Follow blood cultures -Lactate -reassuring -CUt down and stop IVF -OOB, Pt eval  Aspiration pneumonia -As above, SLP evaluation -Esophagram from 5/1 notable for esophageal dysmotility, No esophageal mucosal irregularity, stricture or mass   Dysphagia -As above -Esophagram as noted above, SLP eval pending -treatment options limited      Osteoarthritis -resumed home regimen of oxycodone  Thrombocytosis -I suspect this is Reactive from pneumonia/sepsis -improving  Code Status: DNR DVT Prophylaxis:lovenox Family Communication: son at  bedside Disposition Plan: Home in few days, may need short term SNF depending on progress  Antimicrobials:   Zosyn 5/4    Subjective: Aching all over   Objective: Vitals:   02/06/17 1845 02/07/17 0405 02/07/17 0432 02/07/17 0433  BP: 100/60 104/69 117/67   Pulse:  82 81   Resp: 16 18 18    Temp:  97.7 F (36.5 C) 98.4 F (36.9 C)   TempSrc:  Oral Oral   SpO2: 97% (!) 85% (!) 85% 90%  Weight:      Height:        Intake/Output Summary (Last 24 hours) at 02/07/17 1108 Last data filed at 02/07/17 0948  Gross per 24 hour  Intake          2946.67 ml  Output              850 ml  Net          2096.67 ml   Filed Weights   02/06/17 1344  Weight: 49.4 kg (109 lb)    Examination:  General exam: Appears calm but uncomfortable  Respiratory system: ronchi at bases Cardiovascular system: S1 & S2 heard, RRR. No JVD, murmurs, rubs. Gastrointestinal system: Abdomen is nondistended, soft and nontender. Normal bowel sounds heard. Central nervous system: Alert and oriented. No focal neurological deficits. Extremities: Symmetric 5 x 5 power. Skin: No rashes, lesions or ulcers Psychiatry: anxious    Data Reviewed:   CBC:  Recent Labs Lab 02/06/17 1408 02/07/17 0830  WBC 12.6* 10.3  NEUTROABS 9.8*  --   HGB 9.6* 8.3*  HCT 28.9* 25.0*  MCV 100.0 99.2  PLT 669* 858*   Basic Metabolic Panel:  Recent Labs Lab 02/06/17 1408 02/07/17 0830  NA 133*  133*  K 4.1 3.9  CL 97* 99*  CO2 26 24  GLUCOSE 121* 99  BUN 19 13  CREATININE 1.18* 1.05*  CALCIUM 8.9 8.4*   GFR: Estimated Creatinine Clearance: 30.5 mL/min (A) (by C-G formula based on SCr of 1.05 mg/dL (H)). Liver Function Tests:  Recent Labs Lab 02/06/17 1408  AST 15  ALT 10*  ALKPHOS 95  BILITOT 0.4  PROT 6.0*  ALBUMIN 2.7*   No results for input(s): LIPASE, AMYLASE in the last 168 hours. No results for input(s): AMMONIA in the last 168 hours. Coagulation Profile:  Recent Labs Lab 02/06/17 1408   INR 1.17   Cardiac Enzymes: No results for input(s): CKTOTAL, CKMB, CKMBINDEX, TROPONINI in the last 168 hours. BNP (last 3 results) No results for input(s): PROBNP in the last 8760 hours. HbA1C: No results for input(s): HGBA1C in the last 72 hours. CBG: No results for input(s): GLUCAP in the last 168 hours. Lipid Profile: No results for input(s): CHOL, HDL, LDLCALC, TRIG, CHOLHDL, LDLDIRECT in the last 72 hours. Thyroid Function Tests: No results for input(s): TSH, T4TOTAL, FREET4, T3FREE, THYROIDAB in the last 72 hours. Anemia Panel: No results for input(s): VITAMINB12, FOLATE, FERRITIN, TIBC, IRON, RETICCTPCT in the last 72 hours. Urine analysis: No results found for: COLORURINE, APPEARANCEUR, LABSPEC, PHURINE, GLUCOSEU, HGBUR, BILIRUBINUR, KETONESUR, PROTEINUR, UROBILINOGEN, NITRITE, LEUKOCYTESUR Sepsis Labs: @LABRCNTIP (procalcitonin:4,lacticidven:4)  )No results found for this or any previous visit (from the past 240 hour(s)).       Radiology Studies: Dg Chest 2 View  Result Date: 02/06/2017 CLINICAL DATA:  Cough and hypotension.  Pneumonia. EXAM: CHEST  2 VIEW COMPARISON:  Three days ago FINDINGS: Confluent airspace disease at the left base without convincing progression from prior. Trace pleural effusions. No edema or pneumothorax. Normal heart size and aortic contours. IMPRESSION: 1. Presumed pneumonia on the left without change from prior. Recommend follow-up to clearing. 2. Trace pleural effusions. 3. Oral contrast in the gas distended colon, administered 3 days prior. Electronically Signed   By: Monte Fantasia M.D.   On: 02/06/2017 14:31        Scheduled Meds: . DULoxetine  60 mg Oral Daily  . enoxaparin (LOVENOX) injection  30 mg Subcutaneous Q24H  . pantoprazole  40 mg Oral BID   Continuous Infusions: . sodium chloride 75 mL/hr at 02/07/17 0907  . piperacillin-tazobactam (ZOSYN)  IV 3.375 g (02/07/17 0901)     LOS: 1 day    Time spent:  55min    Domenic Polite, MD [facility] Pager [phone]  If 7PM-7AM, please contact night-coverage [email or web address] Password [email or web address] 02/07/2017, 11:08 AM

## 2017-02-07 NOTE — Evaluation (Signed)
Physical Therapy Evaluation Patient Details Name: [name] MRN: [id number] DOB: [birth year] Today's Date: 02/07/2017   History of Present Illness  Pt adm with sepsis likely due to aspiration PNA due to ongoing dysphagia. PMH - OA, IBS, bil TKR, rt shoulder surgery, back surgery  Clinical Impression  Pt admitted with above diagnosis and presents to PT with functional limitations due to deficits listed below (See PT problem list). Pt needs skilled PT to maximize independence and safety to allow discharge to home with son. Expect pt will make steady progress if able to be stabilized nutritionally. Will need maximal HH services at DC. Of note pt experienced the loss of her middle son three weeks ago to earl onset dementia/Alzheimers.     Follow Up Recommendations Home health PT;Supervision for mobility/OOB ([place] aide, [place])    Equipment Recommendations  3in1 (PT)    Recommendations for Other Services       Precautions / Restrictions Precautions Precautions: Fall Restrictions Weight Bearing Restrictions: No      Mobility  Bed Mobility Overal bed mobility: Needs Assistance Bed Mobility: Supine to Sit;Sit to Supine     Supine to sit: Min guard;HOB elevated Sit to supine: Min assist   General bed mobility comments: Incr time to get to EOB and assist to bring feet back up into bed when returning to supine  Transfers Overall transfer level: Needs assistance Equipment used: Rolling walker (2 wheeled) Transfers: Sit to/from Stand Sit to Stand: Min guard;Min assist         General transfer comment: Assist to bring hips up from low commode. Min guard when coming to stand from bed  Ambulation/Gait Ambulation/Gait assistance: Min guard Ambulation Distance (Feet): 125 Feet Assistive device: Rolling walker (2 wheeled) Gait Pattern/deviations: Step-through pattern;Decreased step length - right;Decreased step length - left;Narrow base of support Gait velocity: decr Gait  velocity interpretation: Below normal speed for age/gender General Gait Details: Assist for safety. Verbal cues to look up.  Stairs            Wheelchair Mobility    Modified Rankin (Stroke Patients Only)       Balance Overall balance assessment: Needs assistance Sitting-balance support: No upper extremity supported;Feet supported Sitting balance-Leahy Scale: Good     Standing balance support: Bilateral upper extremity supported Standing balance-Leahy Scale: Poor Standing balance comment: walker and supervision for static standing                             Pertinent Vitals/Pain Pain Assessment: Faces Pain Score: 4  Faces Pain Scale: Hurts little more Pain Location: generalized Pain Descriptors / Indicators: Grimacing Pain Intervention(s): Limited activity within patient's tolerance    Home Living Family/patient expects to be discharged to:: Private residence Living Arrangements: Children (son lives with her) Available Help at Discharge: Family;Available PRN/intermittently (son available most of time. Looking at hiring private duty) Type of Home: House Home Access: Stairs to enter Entrance Stairs-Rails: Left;Right;Can reach both Entrance Stairs-Number of Steps: 3 Home Layout: Two level;Able to live on main level with bedroom/bathroom Home Equipment: Walker - 2 wheels      Prior Function Level of Independence: Needs assistance   Gait / Transfers Assistance Needed: Until a few weeks ago pt amb independently without assistive device. In past week has used walker and needed intermittent assist           Hand Dominance        Extremity/Trunk Assessment  Upper Extremity Assessment Upper Extremity Assessment: Generalized weakness;RUE deficits/detail;LUE deficits/detail RUE Deficits / Details: limited shoulder ROM and strength LUE Deficits / Details: limited shoulder ROM and strength    Lower Extremity Assessment Lower Extremity Assessment:  Generalized weakness    Cervical / Trunk Assessment Cervical / Trunk Assessment: Kyphotic  Communication   Communication: No difficulties  Cognition Arousal/Alertness: Awake/alert Behavior During Therapy: WFL for tasks assessed/performed Overall Cognitive Status: Within Functional Limits for tasks assessed                                        General Comments      Exercises     Assessment/Plan    PT Assessment Patient needs continued PT services  PT Problem List Decreased strength;Decreased activity tolerance;Decreased balance;Decreased mobility;Decreased knowledge of use of DME       PT Treatment Interventions DME instruction;Gait training;Stair training;Functional mobility training;Therapeutic activities;Therapeutic exercise;Balance training;Patient/family education    PT Goals (Current goals can be found in the Care Plan section)  Acute Rehab PT Goals Patient Stated Goal: return home PT Goal Formulation: With patient Time For Goal Achievement: 02/14/17 Potential to Achieve Goals: Good    Frequency Min 3X/week   Barriers to discharge Inaccessible home environment stairs to enter    Co-evaluation               AM-PAC PT "6 Clicks" Daily Activity  Outcome Measure Difficulty turning over in bed (including adjusting bedclothes, sheets and blankets)?: A Little Difficulty moving from lying on back to sitting on the side of the bed? : A Little Difficulty sitting down on and standing up from a chair with arms (e.g., wheelchair, bedside commode, etc,.)?: A Little Help needed moving to and from a bed to chair (including a wheelchair)?: A Little Help needed walking in hospital room?: A Little Help needed climbing 3-5 steps with a railing? : A Lot 6 Click Score: 17    End of Session Equipment Utilized During Treatment: Gait belt Activity Tolerance: Patient tolerated treatment well Patient left: in bed;with call bell/phone within reach;with  family/visitor present Nurse Communication: Mobility status PT Visit Diagnosis: Unsteadiness on feet (R26.81);Muscle weakness (generalized) (M62.81);Difficulty in walking, not elsewhere classified (R26.2)    Time: 5102-5852 PT Time Calculation (min) (ACUTE ONLY): 27 min   Charges:   PT Evaluation $PT Eval Moderate Complexity: 1 Procedure PT Treatments $Gait Training: 8-22 mins   PT G CodesMarland Kitchen        [facility] [place] 02/07/2017, 5:39 PM

## 2017-02-07 NOTE — Evaluation (Signed)
Clinical/Bedside Swallow Evaluation Patient Details  Name: [name] MRN: [id number] Date of Birth: Jul 16, 1930  Today's Date: 02/07/2017 Time: SLP Start Time (ACUTE ONLY): 1332 SLP Stop Time (ACUTE ONLY): 1345 SLP Time Calculation (min) (ACUTE ONLY): 13 min  Past Medical History:  Past Medical History:  Diagnosis Date  . Arthritis   . IBS (irritable bowel syndrome)    Past Surgical History: History reviewed. No pertinent surgical history. HPI:  [name] a 83 y.o.femalewith PMh of osteoarthritis, IBS with constipation, ongoing issues with dysphagia, she has had trouble with her swallowing for the last several months, she notes dysphagia liquids as well as solids,especially with harder texture solids, this has affected her PO intakeand resulted in weight loss. She just saw Dr. Teena Irani with [facility] recently who did an esophagram which is notable for esophageal dysmotility and no obstruction. Around the same time she started having cough productive of occasional streaks of blood, she saw her primary care doctor on 5/1 diagnosed her with pneumonia and she was subsequently started on Levaquin .Despite this continued to have profound weakness requiring assistance with ADLs and hence was brought to the ED.CXR 02/03/17 showed left lower lobe pneumonia. No prior SLP swallowing evaluations in chart.   Assessment / Plan / Recommendation Clinical Impression  Patient presents with severe risk for aspiration given esophageal dysphagia, current pneumonia and clinical signs of aspiration with all consistencies trialed at bedside. Pt endorses a history of dysphagia with solids and liquids, particularly "thinner liquids" with onset several months ago. She also states a change in vocal quality, with hoarseness lasting for approximately one year. With single sips of thin liquids, pt presents with immediate cough suggestive of reduced airway protection and guards her chest, complains of pain.  Single sips of nectar with no initial signs of aspiration, however pt presents with delayed coughing. Pt with explosive cough after single bite of mechanical soft solid from her meal tray. Provided education to pt and family regarding instrumental assessments to visualize swallowing function. Given suspected multifactorial dysphagia with suspected primary esophageal component, recommend MBS to rule out aspiration, aid in determining safest, least restrictive diet and assist in identifying functional impairments for treatment. Pt and family in agreement. Recommend NPO pending MBS to be completed next date. SLP Visit Diagnosis: Dysphagia, pharyngoesophageal phase (R13.14)    Aspiration Risk  Severe aspiration risk;Risk for inadequate nutrition/hydration    Diet Recommendation NPO        Other  Recommendations Recommended Consults: Consider ENT evaluation Oral Care Recommendations: Oral care QID Other Recommendations: Remove water pitcher   Follow up Recommendations Other (comment) (TBD)      Frequency and Duration            Prognosis Prognosis for Safe Diet Advancement: Fair Barriers to Reach Goals: Other (Comment) (comorbidities)      Swallow Study   General Date of Onset: 02/06/17 HPI: [name] a 83 y.o.femalewith PMh of osteoarthritis, IBS with constipation, ongoing issues with dysphagia, she has had trouble with her swallowing for the last several months, she notes dysphagia liquids as well as solids,especially with harder texture solids, this has affected her PO intakeand resulted in weight loss. She just saw Dr. Teena Irani with [facility] recently who did an esophagram which is notable for esophageal dysmotility and no obstruction. Around the same time she started having cough productive of occasional streaks of blood, she saw her primary care doctor on 5/1 diagnosed her with pneumonia and she was subsequently started  on Levaquin .Despite this continued to have profound  weakness requiring assistance with ADLs and hence was brought to the ED.CXR 02/03/17 showed left lower lobe pneumonia. No prior SLP swallowing evaluations in chart. Type of Study: Bedside Swallow Evaluation Previous Swallow Assessment: see HPI Diet Prior to this Study: Dysphagia 3 (soft);Thin liquids Temperature Spikes Noted: No Respiratory Status: Room air History of Recent Intubation: No Behavior/Cognition: Alert;Cooperative Oral Cavity Assessment: Within Functional Limits Oral Care Completed by SLP: No Oral Cavity - Dentition: Adequate natural dentition Vision: Functional for self-feeding Self-Feeding Abilities: Needs set up;Able to feed self Patient Positioning: Upright in bed Baseline Vocal Quality: Hoarse;Low vocal intensity Volitional Cough: Weak Volitional Swallow: Able to elicit    Oral/Motor/Sensory Function Overall Oral Motor/Sensory Function: Within functional limits   Ice Chips Ice chips: Within functional limits Presentation: Spoon   Thin Liquid Thin Liquid: Impaired Presentation: Spoon;Straw;Cup Pharyngeal  Phase Impairments: Cough - Immediate;Other (comments) (increased worth of breathing)    Nectar Thick Nectar Thick Liquid: Impaired Presentation: Cup;Spoon;Straw Pharyngeal Phase Impairments: Cough - Delayed;Other (comments) (increased work of breathing)   Honey Thick Honey Thick Liquid: Not tested   Puree Puree: Not tested   Solid   GO   Solid: Impaired Presentation: Self Fed;Spoon Pharyngeal Phase Impairments: Cough - Immediate       Haley Lever, MS, CCC-SLP Speech-Language Pathologist [phone]  [name] 02/07/2017,2:08 PM

## 2017-02-07 NOTE — Progress Notes (Signed)
Spoke with [place] NP okay for patient to be on dysphagia 3 diet .Will give patient food and monitor.

## 2017-02-07 NOTE — Progress Notes (Signed)
Patient arrived to unit [place] from emergency department .Patient assisted to bed by nursing staff.Oriented patient and family to call bell and fall risk policy.Patient is high fall risk yellow bracelet and yellow socks applied.Fall risk handout given to patient and family and discussed with them.Bed alarm set patient and family verbalized understanding need to call for help prior too getting out of bed.Patient asking for food but states she has difficulty swallowing at times.Will contact NP on call about diet orders.Denies pain at present time.Will continue to monitor.

## 2017-02-08 ENCOUNTER — Inpatient Hospital Stay (HOSPITAL_COMMUNITY): Payer: Medicare Other

## 2017-02-08 MED ORDER — POLYETHYLENE GLYCOL 3350 17 G PO PACK
17.0000 g | PACK | Freq: Every day | ORAL | Status: DC
  Administered 2017-02-09: 17 g via ORAL
  Filled 2017-02-08 (×2): qty 1

## 2017-02-08 MED ORDER — ROPINIROLE HCL 0.25 MG PO TABS
0.2500 mg | ORAL_TABLET | Freq: Every day | ORAL | Status: DC | PRN
  Administered 2017-02-09: 0.25 mg via ORAL
  Filled 2017-02-08 (×2): qty 1

## 2017-02-08 NOTE — Progress Notes (Signed)
PROGRESS NOTE    [name]  WVP:[id number] DOB: 18-Nov-1929 DOA: 02/06/2017 PCP: Orpah Melter, MD  Brief Narrative:[name] is a 83 y.o. female with PMh of osteoarthritis, IBS with constipation, ongoing issues with dysphagia, she has had trouble with her swallowing for the last several months, she notes dysphagia liquids as well as solids,  especially with harder texture solids, this has affected her PO intake and resulted in weight loss. She just saw Dr. Teena Irani with [facility] recently who did an esophagram which is notable for esophageal dysmotility and no obstruction. Around the same time she started having cough productive of occasional streaks of blood, she saw her primary care doctor on 5/1 diagnosed her with pneumonia and she was subsequently started on Levaquin .Despite this continued to have profound weakness requiring assistance with ADLs and hence was brought to the ED. ED course: Blood pressure in the 80s on arrival with mild tachycardia which responded to fluid bolus, labs notable for mild leukocytosis, thrombocytosis, mild acute kidney injury and chest x-ray notable for left lower lobe pneumonia. Lactate at 1.8   Assessment & Plan:   Sepsis due to undetermined organism [facility]) -Due to suspected aspiration pneumonia, due to ongoing dysphagia  -Improved, sepsis physiology resolved, on IV Zosyn day3 -Blood Cx NGTD -off IVF -Dysphagia eval as below -OOB, Pt eval  Aspiration pneumonia -As above, SLP evaluation -Esophagram from 5/1 notable for esophageal dysmotility, No esophageal mucosal irregularity, stricture or mass   Dysphagia -As above -Esophagram as noted above, SLP eval ongoing -for MBS today -treatment options limited, would not be a PEG tube candidate      Osteoarthritis -resumed home regimen of oxycodone  Thrombocytosis -I suspect this is Reactive from pneumonia/sepsis -improving  Code Status: DNR DVT Prophylaxis:lovenox Family  Communication: son at bedside 5/4 and 5/5 Disposition Plan: may need short term SNF depending on progress  Antimicrobials:   Zosyn 5/4    Subjective: Rested better last night  Objective: Vitals:   02/07/17 0433 02/07/17 1305 02/07/17 2150 02/08/17 0514  BP:  108/62 117/64 114/75  Pulse:  (!) 105 92 (!) 102  Resp:  18 18 18   Temp:  97.5 F (36.4 C) 97.6 F (36.4 C) 98.4 F (36.9 C)  TempSrc:  Oral    SpO2: 90% 95% 98% 94%  Weight:      Height:        Intake/Output Summary (Last 24 hours) at 02/08/17 1106 Last data filed at 02/07/17 1612  Gross per 24 hour  Intake           681.25 ml  Output                0 ml  Net           681.25 ml   Filed Weights   02/06/17 1344  Weight: 49.4 kg (109 lb)    Examination:  General exam: Appears calm, more comfortable  Respiratory system: ronchi at L base Cardiovascular system: S1 & S2 heard, RRR. No JVD, murmurs, rubs. Gastrointestinal system: Abdomen is nondistended, soft and nontender. Normal bowel sounds heard. Central nervous system: Alert and oriented. No focal neurological deficits. Extremities: Symmetric 5 x 5 power. Skin: No rashes, lesions or ulcers Psychiatry: less anxious    Data Reviewed:   CBC:  Recent Labs Lab 02/06/17 1408 02/07/17 0830  WBC 12.6* 10.3  NEUTROABS 9.8*  --   HGB 9.6* 8.3*  HCT 28.9* 25.0*  MCV 100.0 99.2  PLT 669*  426*   Basic Metabolic Panel:  Recent Labs Lab 02/06/17 1408 02/07/17 0830  NA 133* 133*  K 4.1 3.9  CL 97* 99*  CO2 26 24  GLUCOSE 121* 99  BUN 19 13  CREATININE 1.18* 1.05*  CALCIUM 8.9 8.4*   GFR: Estimated Creatinine Clearance: 30.5 mL/min (A) (by C-G formula based on SCr of 1.05 mg/dL (H)). Liver Function Tests:  Recent Labs Lab 02/06/17 1408  AST 15  ALT 10*  ALKPHOS 95  BILITOT 0.4  PROT 6.0*  ALBUMIN 2.7*   No results for input(s): LIPASE, AMYLASE in the last 168 hours. No results for input(s): AMMONIA in the last 168 hours. Coagulation  Profile:  Recent Labs Lab 02/06/17 1408  INR 1.17   Cardiac Enzymes: No results for input(s): CKTOTAL, CKMB, CKMBINDEX, TROPONINI in the last 168 hours. BNP (last 3 results) No results for input(s): PROBNP in the last 8760 hours. HbA1C: No results for input(s): HGBA1C in the last 72 hours. CBG: No results for input(s): GLUCAP in the last 168 hours. Lipid Profile: No results for input(s): CHOL, HDL, LDLCALC, TRIG, CHOLHDL, LDLDIRECT in the last 72 hours. Thyroid Function Tests: No results for input(s): TSH, T4TOTAL, FREET4, T3FREE, THYROIDAB in the last 72 hours. Anemia Panel: No results for input(s): VITAMINB12, FOLATE, FERRITIN, TIBC, IRON, RETICCTPCT in the last 72 hours. Urine analysis: No results found for: COLORURINE, APPEARANCEUR, LABSPEC, PHURINE, GLUCOSEU, HGBUR, BILIRUBINUR, KETONESUR, PROTEINUR, UROBILINOGEN, NITRITE, LEUKOCYTESUR Sepsis Labs: @LABRCNTIP (procalcitonin:4,lacticidven:4)  ) Recent Results (from the past 240 hour(s))  Culture, blood (Routine x 2)     Status: None (Preliminary result)   Collection Time: 02/06/17  4:58 PM  Result Value Ref Range Status   Specimen Description BLOOD RIGHT FOREARM  Final   Special Requests   Final    BOTTLES DRAWN AEROBIC AND ANAEROBIC Blood Culture adequate volume   Culture NO GROWTH < 24 HOURS  Final   Report Status PENDING  Incomplete  Culture, blood (Routine x 2)     Status: None (Preliminary result)   Collection Time: 02/06/17  5:40 PM  Result Value Ref Range Status   Specimen Description BLOOD RIGHT FOREARM  Final   Special Requests   Final    BOTTLES DRAWN AEROBIC AND ANAEROBIC Blood Culture adequate volume   Culture NO GROWTH < 24 HOURS  Final   Report Status PENDING  Incomplete         Radiology Studies: Dg Chest 2 View  Result Date: 02/06/2017 CLINICAL DATA:  Cough and hypotension.  Pneumonia. EXAM: CHEST  2 VIEW COMPARISON:  Three days ago FINDINGS: Confluent airspace disease at the left base without  convincing progression from prior. Trace pleural effusions. No edema or pneumothorax. Normal heart size and aortic contours. IMPRESSION: 1. Presumed pneumonia on the left without change from prior. Recommend follow-up to clearing. 2. Trace pleural effusions. 3. Oral contrast in the gas distended colon, administered 3 days prior. Electronically Signed   By: Monte Fantasia M.D.   On: 02/06/2017 14:31        Scheduled Meds: . DULoxetine  60 mg Oral Daily  . enoxaparin (LOVENOX) injection  30 mg Subcutaneous Q24H  . pantoprazole  40 mg Oral BID   Continuous Infusions: . piperacillin-tazobactam (ZOSYN)  IV 3.375 g (02/08/17 0745)     LOS: 2 days    Time spent: 60min    Domenic Polite, MD [facility] Pager [phone]  If 7PM-7AM, please contact night-coverage [email or web address] Password [email or web address] 02/08/2017, 11:06 AM

## 2017-02-08 NOTE — Progress Notes (Signed)
Modified Barium Swallow Progress Note  Patient Details  Name: [name] MRN: [id number] Date of Birth: 1929-12-02  Today's Date: 02/08/2017  Modified Barium Swallow completed.  Full report located under Chart Review in the Imaging Section.  Brief recommendations include the following:  Clinical Impression  Patient presents with suspected primary esophageal dysphagia and moderate pharyngoesophageal dysphagia with consistent penetration of thin, nectar and honey-thick liquids and moderate pharyngeal residue remaining after the swallow. Deviations in swallow physiology included thickening of cricopharyngeus, reduced duration and amplitude of UES opening which led to obstruction of bolus flow, poor stripping wave and moderate valleculae, mild pyriform and CP segment residue. Epiglottic deflection was incomplete, suspect given suboptimal pharyngeal pressure, leading to reduced airway protection and consistent penetration during the swallow as well as deep penetration of residue during subsequent swallows. Several compensatory strategies were trialed including chin tuck, which did not prevent penetration and increased pharyngeal residue, mendelsohn maneuver, which pt was unable to perform, and effortful dry swallow, which was marginally effective in reducing pyriform, CP segment residue but had no impact on vallecular residue given poor epiglottic movement. Whole barium tablet was retained in the valleculae; pt unable to clear despite max cues for effortful swallow, attempted mendelsohn manuever, puree, nectar or thin liquid wash. Silent aspiration of thin liquids x1 with multiple consecutive swallows in an effort to clear barium tablet from valleculae. Pt ultimately voluntarily coughed and expectorated tablet. While pt did not display frank aspiration of thickened liquids, she remains at high risk for aspiration of these due to increased residue as well as increased likelihood of reflux with thickened  liquids. Spoke with pt at length regarding her wishes for nutrition/hydration. Based on pt's desire to continue eating by mouth while minimizing risks for aspiration, recommend dys 3 diet (pt prefers softer foods) with thin liquids (water only) via single cup sips, with intermittent cough, effortful dry swallow to expel penetrate and reduce pharyngeal residue. Medications should be crushed in puree. Provided education re: risks for aspiration and role of strict oral care in reducing risks. Pt states she brushes her teeth frequently and uses a water pick at home. Advised pt to complete oral care before and after meals. SLP will f/u briefly for education.    Swallow Evaluation Recommendations       SLP Diet Recommendations: Free water protocol after oral care;Thin liquid;Dysphagia 3 (Mech soft) solids   Liquid Administration via: Cup (single sips)   Medication Administration: Crushed with puree   Supervision: Patient able to self feed;Intermittent supervision to cue for compensatory strategies   Compensations: Slow rate;Small sips/bites;Other (Comment) (intermittent cough/ dry swallow)       Oral Care Recommendations: Oral care before and after [place], [place], [place] Speech-Language Pathologist [phone]  Aliene Altes 02/08/2017,3:44 PM

## 2017-02-09 ENCOUNTER — Inpatient Hospital Stay (HOSPITAL_COMMUNITY): Payer: Medicare Other

## 2017-02-09 LAB — CBC
HEMATOCRIT: 30 % — AB (ref 36.0–46.0)
Hemoglobin: 10.3 g/dL — ABNORMAL LOW (ref 12.0–15.0)
MCH: 34 pg (ref 26.0–34.0)
MCHC: 34.3 g/dL (ref 30.0–36.0)
MCV: 99 fL (ref 78.0–100.0)
PLATELETS: 640 10*3/uL — AB (ref 150–400)
RBC: 3.03 MIL/uL — AB (ref 3.87–5.11)
RDW: 12.7 % (ref 11.5–15.5)
WBC: 11 10*3/uL — AB (ref 4.0–10.5)

## 2017-02-09 LAB — BASIC METABOLIC PANEL
ANION GAP: 11 (ref 5–15)
BUN: 9 mg/dL (ref 6–20)
CALCIUM: 8.8 mg/dL — AB (ref 8.9–10.3)
CO2: 26 mmol/L (ref 22–32)
Chloride: 97 mmol/L — ABNORMAL LOW (ref 101–111)
Creatinine, Ser: 0.94 mg/dL (ref 0.44–1.00)
GFR, EST NON AFRICAN AMERICAN: 54 mL/min — AB (ref >60)
Glucose, Bld: 91 mg/dL (ref 65–99)
Potassium: 3.9 mmol/L (ref 3.5–5.1)
Sodium: 134 mmol/L — ABNORMAL LOW (ref 135–145)

## 2017-02-09 MED ORDER — CELECOXIB 100 MG PO CAPS
100.0000 mg | ORAL_CAPSULE | Freq: Two times a day (BID) | ORAL | Status: DC
  Administered 2017-02-09 – 2017-02-10 (×3): 100 mg via ORAL
  Filled 2017-02-09 (×3): qty 1

## 2017-02-09 MED ORDER — FUROSEMIDE 20 MG PO TABS
20.0000 mg | ORAL_TABLET | Freq: Every day | ORAL | Status: DC
  Administered 2017-02-09: 20 mg via ORAL
  Filled 2017-02-09 (×2): qty 1

## 2017-02-09 MED ORDER — PANTOPRAZOLE SODIUM 40 MG PO PACK
40.0000 mg | PACK | Freq: Two times a day (BID) | ORAL | Status: DC
  Administered 2017-02-09 – 2017-02-10 (×3): 40 mg via ORAL
  Filled 2017-02-09 (×3): qty 20

## 2017-02-09 MED ORDER — POTASSIUM CHLORIDE CRYS ER 20 MEQ PO TBCR
40.0000 meq | EXTENDED_RELEASE_TABLET | Freq: Every day | ORAL | Status: DC
  Administered 2017-02-09 – 2017-02-10 (×2): 40 meq via ORAL
  Filled 2017-02-09 (×2): qty 2

## 2017-02-09 NOTE — Progress Notes (Signed)
PROGRESS NOTE    [name]  XBD:[id number] DOB: Aug 27, 1930 DOA: 02/06/2017 PCP: Orpah Melter, MD  Brief Narrative:[name] is a 83 y.o. female with PMh of osteoarthritis, IBS with constipation, ongoing issues with dysphagia, she has had trouble with her swallowing for the last several months, she notes dysphagia liquids as well as solids,  especially with harder texture solids, this has affected her PO intake and resulted in weight loss. She just saw Dr. Teena Irani with [facility] recently who did an esophagram which is notable for esophageal dysmotility and no obstruction. Around the same time she started having cough productive of occasional streaks of blood, she saw her primary care doctor on 5/1 diagnosed her with pneumonia and she was subsequently started on Levaquin .Despite this continued to have profound weakness requiring assistance with ADLs and hence was brought to the ED. ED course: Blood pressure in the 80s on arrival with mild tachycardia which responded to fluid bolus, labs notable for mild leukocytosis, thrombocytosis, mild acute kidney injury and chest x-ray notable for left lower lobe pneumonia. Lactate at 1.8   Assessment & Plan:   Sepsis /Aspiration pneumonia -suspected aspiration pneumonia, due to ongoing dysphagia  -Improving, sepsis physiology resolved, on IV Zosyn day3 -will repeat CXR today, breath sounds more diminished at bases, may be developing an effusion -Blood Cx NGTD -off IVF -Dysphagia eval as below -OOB, Pt eval, home health with supervision recommended  Aspiration pneumonia -As above, SLP evaluation -Esophagram from 5/1 notable for esophageal dysmotility, No esophageal mucosal irregularity, stricture or mass   Dysphagia -As above -Esophagram as noted above, SLP following -s/p MBS 5/6: noted primary esophageal dysphagia and moderate pharyngoesophageal dysphagia, pt did not display frank aspiration of thickened liquids, she remains at  high risk for aspiration  -treatment options limited, discussed with patient that she will always be at risk for aspirating, doesn't want a Feeding tube and this would not be a good option in her      Osteoarthritis -resumed home regimen of oxycodone and celebrex  Thrombocytosis -I suspect this is Reactive from pneumonia/sepsis -improving  Code Status: DNR DVT Prophylaxis:lovenox Family Communication: son at bedside 5/4 and 5/5, will call and update son today Disposition Plan: home health recommended, home in ?2days  Antimicrobials:   Zosyn 5/4    Subjective: Rested last night, breathing ok,  Objective: Vitals:   02/08/17 0514 02/08/17 1430 02/08/17 2206 02/09/17 0528  BP: 114/75 (!) 112/55 128/68 113/62  Pulse: (!) 102 94 (!) 102 100  Resp: 18  18 18   Temp: 98.4 F (36.9 C) 98.4 F (36.9 C) 98.3 F (36.8 C) 98.7 F (37.1 C)  TempSrc:    Oral  SpO2: 94% 94% 100%   Weight:      Height:       No intake or output data in the 24 hours ending 02/09/17 1005 Filed Weights   02/06/17 1344  Weight: 49.4 kg (109 lb)    Examination:  General exam: Appears calm, more comfortable, AAOx3 Respiratory system: ronchi and decreased BS at L base Cardiovascular system: S1 & S2 heard, RRR. No JVD, murmurs, rubs. Gastrointestinal system: Abdomen is  mildy distended, soft and nontender. Normal bowel sounds heard. Central nervous system: Alert and oriented. No focal neurological deficits. Extremities: Symmetric 5 x 5 power. Skin: No rashes, lesions or ulcers Psychiatry: less anxious    Data Reviewed:   CBC:  Recent Labs Lab 02/06/17 1408 02/07/17 0830 02/09/17 0654  WBC 12.6* 10.3 11.0*  NEUTROABS 9.8*  --   --   HGB 9.6* 8.3* 10.3*  HCT 28.9* 25.0* 30.0*  MCV 100.0 99.2 99.0  PLT 669* 585* 354*   Basic Metabolic Panel:  Recent Labs Lab 02/06/17 1408 02/07/17 0830 02/09/17 0654  NA 133* 133* 134*  K 4.1 3.9 3.9  CL 97* 99* 97*  CO2 26 24 26   GLUCOSE 121*  99 91  BUN 19 13 9   CREATININE 1.18* 1.05* 0.94  CALCIUM 8.9 8.4* 8.8*   GFR: Estimated Creatinine Clearance: 34.1 mL/min (by C-G formula based on SCr of 0.94 mg/dL). Liver Function Tests:  Recent Labs Lab 02/06/17 1408  AST 15  ALT 10*  ALKPHOS 95  BILITOT 0.4  PROT 6.0*  ALBUMIN 2.7*   No results for input(s): LIPASE, AMYLASE in the last 168 hours. No results for input(s): AMMONIA in the last 168 hours. Coagulation Profile:  Recent Labs Lab 02/06/17 1408  INR 1.17   Cardiac Enzymes: No results for input(s): CKTOTAL, CKMB, CKMBINDEX, TROPONINI in the last 168 hours. BNP (last 3 results) No results for input(s): PROBNP in the last 8760 hours. HbA1C: No results for input(s): HGBA1C in the last 72 hours. CBG: No results for input(s): GLUCAP in the last 168 hours. Lipid Profile: No results for input(s): CHOL, HDL, LDLCALC, TRIG, CHOLHDL, LDLDIRECT in the last 72 hours. Thyroid Function Tests: No results for input(s): TSH, T4TOTAL, FREET4, T3FREE, THYROIDAB in the last 72 hours. Anemia Panel: No results for input(s): VITAMINB12, FOLATE, FERRITIN, TIBC, IRON, RETICCTPCT in the last 72 hours. Urine analysis: No results found for: COLORURINE, APPEARANCEUR, LABSPEC, PHURINE, GLUCOSEU, HGBUR, BILIRUBINUR, KETONESUR, PROTEINUR, UROBILINOGEN, NITRITE, LEUKOCYTESUR Sepsis Labs: @LABRCNTIP (procalcitonin:4,lacticidven:4)  ) Recent Results (from the past 240 hour(s))  Culture, blood (Routine x 2)     Status: None (Preliminary result)   Collection Time: 02/06/17  4:58 PM  Result Value Ref Range Status   Specimen Description BLOOD RIGHT FOREARM  Final   Special Requests   Final    BOTTLES DRAWN AEROBIC AND ANAEROBIC Blood Culture adequate volume   Culture NO GROWTH 2 DAYS  Final   Report Status PENDING  Incomplete  Culture, blood (Routine x 2)     Status: None (Preliminary result)   Collection Time: 02/06/17  5:40 PM  Result Value Ref Range Status   Specimen Description  BLOOD RIGHT FOREARM  Final   Special Requests   Final    BOTTLES DRAWN AEROBIC AND ANAEROBIC Blood Culture adequate volume   Culture NO GROWTH 2 DAYS  Final   Report Status PENDING  Incomplete         Radiology Studies: Dg Chest 2 View  Result Date: 02/09/2017 CLINICAL DATA:  83 year old female treated for aspiration pneumonia for the past week. Symptoms improved until yesterday. Now with increased productive cough and slight shortness of breath. Subsequent encounter. EXAM: CHEST  2 VIEW COMPARISON:  02/06/2017, 02/03/2017 and 12/17/2015 chest x-ray. FINDINGS: Progressive consolidation left lung most consistent with left lower lobe infiltrate with left-sided pleural effusion. Follow-up until clearance recommended to exclude underlying lesion. Heart size top-normal to slightly enlarged. Calcified mildly tortuous aorta. Pulmonary vascular congestion. Surgery with fusion utilizing pedicle screws and posterior connecting bar with abnormal positioning of pedicle screws as previously noted. No new hardware abnormality or compression fracture detected. Degenerative changes shoulder joints bilaterally. IMPRESSION: Progressive consolidation left lung most consistent with left lower lobe infiltrate with left-sided pleural effusion. Heart size top-normal to slightly enlarged. Pulmonary vascular congestion. Electronically Signed   By:  Genia Del M.D.   On: 02/09/2017 09:34   Dg Swallowing Func-speech Pathology  Result Date: 02/08/2017 Objective Swallowing Evaluation: Type of Study: MBS-Modified Barium Swallow Study Patient Details Name: [name] MRN: [id number] Date of Birth: Apr 29, 1930 Today's Date: 02/08/2017 Time: SLP Start Time (ACUTE ONLY): 1330-SLP Stop Time (ACUTE ONLY): 1400 SLP Time Calculation (min) (ACUTE ONLY): 30 min Past Medical History: Past Medical History: Diagnosis Date . Arthritis  . IBS (irritable bowel syndrome)  Past Surgical History: No past surgical history on file. HPI: [name] a 83 y.o.femalewith PMh of osteoarthritis, IBS with constipation, ongoing issues with dysphagia, she has had trouble with her swallowing for the last several months, she notes dysphagia liquids as well as solids,especially with harder texture solids, this has affected her PO intakeand resulted in weight loss. She just saw Dr. Teena Irani with [facility] recently who did an esophagram which is notable for esophageal dysmotility and no obstruction. Around the same time she started having cough productive of occasional streaks of blood, she saw her primary care doctor on 5/1 diagnosed her with pneumonia and she was subsequently started on Levaquin .Despite this continued to have profound weakness requiring assistance with ADLs and hence was brought to the ED.CXR 02/03/17 showed left lower lobe pneumonia. No prior SLP swallowing evaluations in chart. Subjective: alert, cooperative, pleasant Assessment / Plan / Recommendation CHL IP CLINICAL IMPRESSIONS 02/08/2017 Clinical Impression Patient presents with suspected primary esophageal dysphagia and moderate pharyngoesophageal dysphagia with consistent penetration of thin, nectar and honey-thick liquids and moderate pharyngeal residue remaining after the swallow. Deviations in swallow physiology included thickening of cricopharyngeus, reduced duration and amplitude of UES opening which led to obstruction of bolus flow, poor stripping wave and moderate valleculae, mild pyriform and CP segment residue. Epiglottic deflection was incomplete, suspect given suboptimal pharyngeal pressure, leading to reduced airway protection and consistent penetration during the swallow as well as deep penetration of residue during subsequent swallows. Several compensatory strategies were trialed including chin tuck, which did not prevent penetration and increased pharyngeal residue, mendelsohn maneuver, which pt was unable to perform, and effortful dry swallow, which was marginally  effective in reducing pyriform, CP segment residue but had no impact on vallecular residue given poor epiglottic movement. Whole barium tablet was retained in the valleculae; pt unable to clear despite max cues for effortful swallow, attempted mendelsohn manuever, puree, nectar or thin liquid wash. Silent aspiration of thin liquids x1 with multiple consecutive swallows in an effort to clear barium tablet from valleculae. Pt ultimately voluntarily coughed and expectorated tablet. While pt did not display frank aspiration of thickened liquids, she remains at high risk for aspiration of these due to increased residue as well as increased likelihood of reflux with thickened liquids. Spoke with pt at length regarding her wishes for nutrition/hydration. Based on pt's desire to continue eating by mouth while minimizing risks for aspiration, recommend dys 3 diet (pt prefers softer foods) with thin liquids (water only) via single cup sips, with intermittent cough, effortful dry swallow to expel penetrate and reduce pharyngeal residue. Medications should be crushed in puree. Provided education re: risks for aspiration and role of strict oral care in reducing risks. Pt states she brushes her teeth frequently and uses a water pick at home. Advised pt to complete oral care before and after meals. SLP will f/u briefly for education.  SLP Visit Diagnosis Dysphagia, pharyngoesophageal phase (R13.14) Attention and concentration deficit following -- Frontal lobe and executive function deficit following --  Impact on safety and function Moderate aspiration risk;Severe aspiration risk   CHL IP TREATMENT RECOMMENDATION 02/08/2017 Treatment Recommendations Therapy as outlined in treatment plan below   Prognosis 02/08/2017 Prognosis for Safe Diet Advancement Fair Barriers to Reach Goals Other (Comment) Barriers/Prognosis Comment -- CHL IP DIET RECOMMENDATION 02/08/2017 SLP Diet Recommendations Free water protocol after oral care;Thin  liquid;Dysphagia 3 (Mech soft) solids Liquid Administration via Cup Medication Administration Crushed with puree Compensations Slow rate;Small sips/bites;Other (Comment) Postural Changes --   CHL IP OTHER RECOMMENDATIONS 02/08/2017 Recommended Consults -- Oral Care Recommendations Oral care before and after PO Other Recommendations --   CHL IP FOLLOW UP RECOMMENDATIONS 02/08/2017 Follow up Recommendations Other (comment)   CHL IP FREQUENCY AND DURATION 02/08/2017 Speech Therapy Frequency (ACUTE ONLY) min 1 x/week Treatment Duration 1 week      CHL IP ORAL PHASE 02/08/2017 Oral Phase WFL Oral - Pudding Teaspoon -- Oral - Pudding Cup -- Oral - Honey Teaspoon -- Oral - Honey Cup -- Oral - Nectar Teaspoon -- Oral - Nectar Cup -- Oral - Nectar Straw -- Oral - Thin Teaspoon -- Oral - Thin Cup -- Oral - Thin Straw -- Oral - Puree -- Oral - Mech Soft -- Oral - Regular -- Oral - Multi-Consistency -- Oral - Pill -- Oral Phase - Comment --  CHL IP PHARYNGEAL PHASE 02/08/2017 Pharyngeal Phase Impaired Pharyngeal- Pudding Teaspoon -- Pharyngeal -- Pharyngeal- Pudding Cup -- Pharyngeal -- Pharyngeal- Honey Teaspoon Delayed swallow initiation-vallecula;Reduced airway/laryngeal closure;Reduced pharyngeal peristalsis;Reduced epiglottic inversion;Penetration/Aspiration during swallow;Penetration/Apiration after swallow;Pharyngeal residue - valleculae;Pharyngeal residue - pyriform;Pharyngeal residue - cp segment Pharyngeal Material enters airway, remains ABOVE vocal cords and not ejected out Pharyngeal- Honey Cup Delayed swallow initiation-vallecula;Reduced pharyngeal peristalsis;Reduced epiglottic inversion;Reduced airway/laryngeal closure;Penetration/Aspiration during swallow;Penetration/Apiration after swallow;Pharyngeal residue - valleculae;Pharyngeal residue - posterior pharnyx;Pharyngeal residue - cp segment Pharyngeal Material enters airway, CONTACTS cords and not ejected out Pharyngeal- Nectar Teaspoon Delayed swallow  initiation-vallecula;Reduced pharyngeal peristalsis;Reduced airway/laryngeal closure;Penetration/Aspiration during swallow;Penetration/Apiration after swallow;Pharyngeal residue - pyriform;Pharyngeal residue - valleculae;Pharyngeal residue - cp segment Pharyngeal Material enters airway, remains ABOVE vocal cords and not ejected out Pharyngeal- Nectar Cup Delayed swallow initiation-vallecula;Reduced pharyngeal peristalsis;Reduced airway/laryngeal closure;Reduced epiglottic inversion;Penetration/Aspiration during swallow;Penetration/Apiration after swallow;Pharyngeal residue - valleculae;Pharyngeal residue - pyriform;Pharyngeal residue - cp segment Pharyngeal Material enters airway, CONTACTS cords and not ejected out Pharyngeal- Nectar Straw -- Pharyngeal -- Pharyngeal- Thin Teaspoon Delayed swallow initiation-vallecula;Reduced epiglottic inversion;Reduced airway/laryngeal closure;Penetration/Aspiration during swallow;Penetration/Apiration after swallow;Pharyngeal residue - valleculae;Pharyngeal residue - pyriform;Pharyngeal residue - cp segment Pharyngeal Material enters airway, remains ABOVE vocal cords and not ejected out Pharyngeal- Thin Cup Delayed swallow initiation-vallecula;Reduced epiglottic inversion;Reduced pharyngeal peristalsis;Reduced airway/laryngeal closure;Penetration/Aspiration during swallow;Penetration/Apiration after swallow;Pharyngeal residue - valleculae;Pharyngeal residue - pyriform;Pharyngeal residue - cp segment Pharyngeal Material enters airway, CONTACTS cords and not ejected out Pharyngeal- Thin Straw -- Pharyngeal -- Pharyngeal- Puree Reduced airway/laryngeal closure;Pharyngeal residue - valleculae;Pharyngeal residue - pyriform;Pharyngeal residue - cp segment;Reduced pharyngeal peristalsis;Delayed swallow initiation-vallecula;Reduced epiglottic inversion Pharyngeal -- Pharyngeal- Mechanical Soft -- Pharyngeal -- Pharyngeal- Regular Delayed swallow initiation-pyriform sinuses;Reduced  pharyngeal peristalsis;Reduced epiglottic inversion;Reduced airway/laryngeal closure;Pharyngeal residue - valleculae;Pharyngeal residue - cp segment Pharyngeal -- Pharyngeal- Multi-consistency Delayed swallow initiation-vallecula;Reduced epiglottic inversion;Reduced airway/laryngeal closure;Reduced pharyngeal peristalsis;Penetration/Aspiration during swallow;Penetration/Apiration after swallow;Moderate aspiration;Pharyngeal residue - valleculae;Pharyngeal residue - cp segment;Pharyngeal residue - pyriform Pharyngeal Material enters airway, passes BELOW cords without attempt by patient to eject out (silent aspiration) Pharyngeal- Pill Delayed swallow initiation-vallecula;Reduced epiglottic inversion;Reduced pharyngeal peristalsis;Reduced airway/laryngeal closure;Pharyngeal residue - valleculae Pharyngeal -- Pharyngeal Comment --  CHL IP CERVICAL ESOPHAGEAL PHASE 02/08/2017 Cervical Esophageal  Phase Impaired Pudding Teaspoon -- Pudding Cup -- Honey Teaspoon -- Honey Cup -- Nectar Teaspoon -- Nectar Cup -- Nectar Straw -- Thin Teaspoon -- Thin Cup -- Thin Straw -- Puree -- Mechanical Soft -- Regular -- Multi-consistency -- Pill -- Cervical Esophageal Comment reduced cricopharyngeal relaxation, prominent CP segment, consistent CP residue and retention of material in the cervical esophagus No flowsheet data found. Aliene Altes 02/08/2017, 3:44 PM  Deneise Lever, MS, CCC-SLP Speech-Language Pathologist [phone]                  Scheduled Meds: . celecoxib  100 mg Oral BID  . DULoxetine  60 mg Oral Daily  . enoxaparin (LOVENOX) injection  30 mg Subcutaneous Q24H  . pantoprazole sodium  40 mg Oral BID  . polyethylene glycol  17 g Oral Daily   Continuous Infusions: . piperacillin-tazobactam (ZOSYN)  IV 3.375 g (02/09/17 0938)     LOS: 3 days    Time spent: 82min    Domenic Polite, MD [facility] Pager [phone]  If 7PM-7AM, please contact night-coverage [email or web address] Password  [email or web address] 02/09/2017, 10:05 AM

## 2017-02-09 NOTE — Progress Notes (Signed)
Physical Therapy Treatment Patient Details Name: [name] MRN: [id number] DOB: Jun 06, 1930 Today's Date: 02/09/2017    History of Present Illness Pt adm with sepsis likely due to aspiration PNA due to ongoing dysphagia. PMH - OA, IBS, bil TKR, rt shoulder surgery, back surgery    PT Comments    Patient seen for mobility progression. Tolerating activity well but limited overall by fatigue, weakness, and pain. Patient would benefit from use of 4 wheeled rollator walker upon discharge as well as HHPT. Will continue to see and progress as tolerated.   Follow Up Recommendations  Home health PT;Supervision for mobility/OOB ([place] aide, HHOT)     Equipment Recommendations  3in1 (PT) (Rollator 4-wheeled )    Recommendations for Other Services       Precautions / Restrictions Precautions Precautions: Fall Restrictions Weight Bearing Restrictions: No    Mobility  Bed Mobility Overal bed mobility: Needs Assistance Bed Mobility: Supine to Sit;Sit to Supine     Supine to sit: Min guard;HOB elevated Sit to supine: Min assist   General bed mobility comments: Assist to elevate LEs back to bed due to fatigue  Transfers Overall transfer level: Needs assistance Equipment used: Rolling walker (2 wheeled) Transfers: Sit to/from Stand Sit to Stand: Min guard         General transfer comment: Min guard for stability and safety during standing  Ambulation/Gait Ambulation/Gait assistance: Min guard Ambulation Distance (Feet): 210 Feet Assistive device: Rolling walker (2 wheeled) Gait Pattern/deviations: Step-through pattern;Decreased step length - right;Decreased step length - left;Narrow base of support Gait velocity: decr   General Gait Details: VCs for posture and positioning. toelrated 15 ft HHA.  (2 standing rest breaks)   Stairs            Wheelchair Mobility    Modified Rankin (Stroke Patients Only)       Balance Overall balance assessment: Needs  assistance Sitting-balance support: No upper extremity supported;Feet supported Sitting balance-Leahy Scale: Good     Standing balance support: Bilateral upper extremity supported Standing balance-Leahy Scale: Poor Standing balance comment: reliance on RW for support                            Cognition Arousal/Alertness: Awake/alert Behavior During Therapy: WFL for tasks assessed/performed Overall Cognitive Status: Within Functional Limits for tasks assessed                                        Exercises      General Comments        Pertinent Vitals/Pain Pain Assessment: Faces Faces Pain Scale: Hurts little more Pain Location: generalized pain "all over" from arthritis Pain Descriptors / Indicators: Grimacing Pain Intervention(s): Monitored during session    Home Living                      Prior Function            PT Goals (current goals can now be found in the care plan section) Acute Rehab PT Goals Patient Stated Goal: return home PT Goal Formulation: With patient Time For Goal Achievement: 02/14/17 Potential to Achieve Goals: Good Progress towards PT goals: Progressing toward goals    Frequency    Min 3X/week      PT Plan Current plan remains appropriate    Co-evaluation  AM-PAC PT "6 Clicks" Daily Activity  Outcome Measure  Difficulty turning over in bed (including adjusting bedclothes, sheets and blankets)?: A Little Difficulty moving from lying on back to sitting on the side of the bed? : A Little Difficulty sitting down on and standing up from a chair with arms (e.g., wheelchair, bedside commode, etc,.)?: A Little Help needed moving to and from a bed to chair (including a wheelchair)?: A Little Help needed walking in hospital room?: A Little Help needed climbing 3-5 steps with a railing? : A Lot 6 Click Score: 17    End of Session Equipment Utilized During Treatment: Gait  belt Activity Tolerance: Patient tolerated treatment well Patient left: in bed;with call bell/phone within reach;with family/visitor present Nurse Communication: Mobility status PT Visit Diagnosis: Unsteadiness on feet (R26.81);Muscle weakness (generalized) (M62.81);Difficulty in walking, not elsewhere classified (R26.2)     Time: 0258-5277 PT Time Calculation (min) (ACUTE ONLY): 19 min  Charges:  $Gait Training: 8-22 mins                    G Codes:       Alben Deeds, PT DPT  [place] 02/09/2017, 12:35 PM

## 2017-02-10 ENCOUNTER — Encounter (HOSPITAL_COMMUNITY): Payer: Self-pay

## 2017-02-10 DIAGNOSIS — J690 Pneumonitis due to inhalation of food and vomit: Secondary | ICD-10-CM | POA: Diagnosis present

## 2017-02-10 LAB — BASIC METABOLIC PANEL
ANION GAP: 11 (ref 5–15)
BUN: 11 mg/dL (ref 6–20)
CALCIUM: 8.7 mg/dL — AB (ref 8.9–10.3)
CO2: 28 mmol/L (ref 22–32)
Chloride: 98 mmol/L — ABNORMAL LOW (ref 101–111)
Creatinine, Ser: 1 mg/dL (ref 0.44–1.00)
GFR, EST AFRICAN AMERICAN: 58 mL/min — AB (ref >60)
GFR, EST NON AFRICAN AMERICAN: 50 mL/min — AB (ref >60)
GLUCOSE: 94 mg/dL (ref 65–99)
POTASSIUM: 3.4 mmol/L — AB (ref 3.5–5.1)
Sodium: 137 mmol/L (ref 135–145)

## 2017-02-10 LAB — CBC
HCT: 27.6 % — ABNORMAL LOW (ref 36.0–46.0)
Hemoglobin: 9.4 g/dL — ABNORMAL LOW (ref 12.0–15.0)
MCH: 33.5 pg (ref 26.0–34.0)
MCHC: 34.1 g/dL (ref 30.0–36.0)
MCV: 98.2 fL (ref 78.0–100.0)
PLATELETS: 573 10*3/uL — AB (ref 150–400)
RBC: 2.81 MIL/uL — AB (ref 3.87–5.11)
RDW: 13 % (ref 11.5–15.5)
WBC: 8.3 10*3/uL (ref 4.0–10.5)

## 2017-02-10 MED ORDER — AMOXICILLIN-POT CLAVULANATE 875-125 MG PO TABS
1.0000 | ORAL_TABLET | Freq: Two times a day (BID) | ORAL | Status: DC
  Administered 2017-02-10: 1 via ORAL
  Filled 2017-02-10: qty 1

## 2017-02-10 MED ORDER — ROPINIROLE HCL 0.25 MG PO TABS: 0.2500 mg | ORAL_TABLET | Freq: Every day | ORAL | 0 refills | Status: DC | PRN

## 2017-02-10 MED ORDER — AMOXICILLIN-POT CLAVULANATE 875-125 MG PO TABS: 1.0000 | ORAL_TABLET | Freq: Two times a day (BID) | ORAL | 0 refills | Status: DC

## 2017-02-10 MED ORDER — ROPINIROLE HCL 0.25 MG PO TABS: 0.2500 mg | ORAL_TABLET | Freq: Every day | ORAL | 0 refills | Status: AC | PRN

## 2017-02-10 NOTE — Progress Notes (Signed)
Pt given discharge instructions, prescriptions, and care notes. Pt verbalized understanding AEB no further questions or concerns at this time. IV was discontinued, no redness, pain, or swelling noted at this time. Telemetry discontinued and Centralized Telemetry was notified. Pt left the floor via wheelchair with staff in stable condition. 

## 2017-02-10 NOTE — Care Management Important Message (Signed)
Important Message  Patient Details  Name: [name] MRN: [id number] Date of Birth: Mar 03, 1930   Medicare Important Message Given:  Yes    [name] Abena 02/10/2017, 1:05 PM

## 2017-02-10 NOTE — Progress Notes (Signed)
Physical Therapy Treatment Patient Details Name: [name] MRN: [id number] DOB: 1929/12/04 Today's Date: 02/10/2017    History of Present Illness Pt adm with sepsis likely due to aspiration PNA due to ongoing dysphagia. PMH - OA, IBS, bil TKR, rt shoulder surgery, back surgery    PT Comments    Pt brushed her teeth and combed her hair standing in front of sink with RW.  Pt then ambulated in hallway to tolerance.  Follow Up Recommendations  Home health PT;Supervision for mobility/OOB ([facility], [place])     Equipment Recommendations  3in1 (PT) (rollator 4 wheel walker)    Recommendations for Other Services       Precautions / Restrictions Precautions Precautions: Fall    Mobility  Bed Mobility Overal bed mobility: Needs Assistance Bed Mobility: Supine to Sit     Supine to sit: Supervision;HOB elevated        Transfers Overall transfer level: Needs assistance Equipment used: Rolling walker (2 wheeled) Transfers: Sit to/from Stand Sit to Stand: Min guard         General transfer comment: min/guard for safety, cues for hand placement  Ambulation/Gait Ambulation/Gait assistance: Min guard Ambulation Distance (Feet): 180 Feet Assistive device: Rolling walker (2 wheeled) Gait Pattern/deviations: Step-through pattern;Decreased stride length     General Gait Details: verbal cues for RW positioning and posture, short standing rest break half distance   Stairs            Wheelchair Mobility    Modified Rankin (Stroke Patients Only)       Balance Overall balance assessment: Needs assistance         Standing balance support: Bilateral upper extremity supported Standing balance-Leahy Scale: Poor Standing balance comment: reliance on RW for support                            Cognition Arousal/Alertness: Awake/alert Behavior During Therapy: WFL for tasks assessed/performed Overall Cognitive Status: Within Functional Limits for tasks  assessed                                        Exercises      General Comments        Pertinent Vitals/Pain Pain Assessment: No/denies pain    Home Living                      Prior Function            PT Goals (current goals can now be found in the care plan section) Progress towards PT goals: Progressing toward goals    Frequency    Min 3X/week      PT Plan Current plan remains appropriate    Co-evaluation              AM-PAC PT "6 Clicks" Daily Activity  Outcome Measure  Difficulty turning over in bed (including adjusting bedclothes, sheets and blankets)?: None Difficulty moving from lying on back to sitting on the side of the bed? : A Little Difficulty sitting down on and standing up from a chair with arms (e.g., wheelchair, bedside commode, etc,.)?: A Little Help needed moving to and from a bed to chair (including a wheelchair)?: A Little Help needed walking in hospital room?: A Little Help needed climbing 3-5 steps with a railing? : A Lot 6 Click Score: 18  End of Session   Activity Tolerance: Patient tolerated treatment well Patient left: in chair;with call bell/phone within reach (pt agreeable to use call bell if exiting recliner, assist back to bed)   PT Visit Diagnosis: Difficulty in walking, not elsewhere classified (R26.2)     Time: 4627-0350 PT Time Calculation (min) (ACUTE ONLY): 20 min  Charges:  $Gait Training: 8-22 mins                    G Codes:       Carmelia Bake, PT, DPT 02/10/2017 Pager: [phone]    York Ram E 02/10/2017, 11:07 AM

## 2017-02-11 LAB — CULTURE, BLOOD (ROUTINE X 2)
CULTURE: NO GROWTH
Culture: NO GROWTH
SPECIAL REQUESTS: ADEQUATE
Special Requests: ADEQUATE

## 2017-02-18 NOTE — Discharge Summary (Addendum)
Physician Discharge Summary  [name] PPI:[id number] DOB: 10/10/1929 DOA: 02/06/2017  PCP: Orpah Melter, MD  Admit date: 02/06/2017 Discharge date: 02/10/2017  Time spent: 35 minutes  Recommendations for Outpatient Follow-up:  1. PCP Dr.Myers in 1 week 2. Educated pt and son regarding possible need for Palliative care/hospice down the road if she has recurrent aspiration events/pneumonia etc   Discharge Diagnoses:  Active Problems:   Dysphagia   Sepsis due to undetermined organism [name])   Osteoarthritis   Sepsis ([place])   Aspiration pneumonia ([place])   Discharge Condition: stable  Diet recommendation: dysphagia 3 diet with thin liquids  Filed Weights   02/06/17 1344  Weight: 49.4 kg (109 lb)    History of present illness:  [name] a 83 y.o.femalewith PMh of osteoarthritis, IBS with constipation, ongoing issues with dysphagia, she has had trouble with her swallowing for the last several months, she notes dysphagia liquids as well as solids, especially with harder texture solids, this has affected her PO intakeand resulted in weight loss. She just saw Dr. Teena Irani with [facility] recently who did an esophagram which is notable for esophageal dysmotility and no obstruction. Around the same time she started having cough productive of occasional streaks of blood, she saw her primary care doctor on 5/1 diagnosed her with pneumonia and she was subsequently started on Levaquin .Despite this continued to have profound weakness requiring assistance with ADLs and hence was brought to the ED  Hospital Course:  Sepsis /Aspiration pneumonia -suspected aspiration pneumonia, due to ongoing dysphagia  -Improved, sepsis physiology resolved, treated with IV zosyn and then changed to Po Augmentin at discharge -Blood Cx NGTD -Dysphagia eval as below -Pt eval completed, home health with supervision recommended and set up  Aspiration pneumonia -As above, SLP evaluation  completed, see below -Esophagram from 5/1 notable for esophageal dysmotility, No esophageal mucosal irregularity, stricture or mass  Dysphagia -As above -Esophagram as noted above suggests esophageal dysmotility, SLP consulted -s/p MBS 5/6: noted primary esophageal dysphagia and moderate pharyngoesophageal dysphagia, pt did not display frank aspiration of thickened liquids, she remains at high risk for aspiration, D3 diet with thin liquids recommended -treatment options limited, discussed with patient that she will always be at risk for aspirating, doesn't want a Feeding tube and this would not be a good option in her. - I called her son and discussed with pt regarding ongoing risk of aspiration and need to consider Palliative care/hospice should she decline frequently due to this  Osteoarthritis -resumedhome regimen of oxycodone and celebrex  Thrombocytosis -I suspect this is Reactive from pneumonia/sepsis -improving   Consultations:  SLP evaluation  Discharge Exam: Vitals:   02/09/17 2205 02/10/17 0608  BP: 127/68 (!) 111/55  Pulse: (!) 109 94  Resp: 20 18  Temp: 98.4 F (36.9 C) 97.7 F (36.5 C)    General: AAOx3 Cardiovascular: S1S2/RRR Respiratory: ronchi at both bases  Discharge Instructions   Discharge Instructions    Discharge instructions    Complete by:  As directed    Dysphagia 3 diet with thin liquids and FULL ASPIRATION Precautions   Increase activity slowly    Complete by:  As directed      Discharge Medication List as of 02/10/2017  2:09 PM    START taking these medications   Details  amoxicillin-clavulanate (AUGMENTIN) 875-125 MG tablet Take 1 tablet by mouth every 12 (twelve) hours. For 4 days, Starting Tue 02/10/2017, Print    rOPINIRole (REQUIP) 0.25 MG tablet Take 1  tablet (0.25 mg total) by mouth daily as needed (severe restless legs)., Starting Tue 02/10/2017, Print      CONTINUE these medications which have NOT CHANGED   Details   benzonatate (TESSALON) 100 MG capsule Take 100 mg by mouth 3 (three) times daily as needed for cough. , Starting Tue 02/03/2017, Historical Med    celecoxib (CELEBREX) 200 MG capsule Take 200 mg by mouth 2 (two) times daily., Starting Sat 01/10/2017, Historical Med    DULoxetine (CYMBALTA) 60 MG capsule Take 60 mg by mouth at bedtime. , Starting Mon 12/22/2016, Historical Med    HYDROcodone-acetaminophen (NORCO) 7.5-325 MG tablet Take 1 tablet by mouth every 4 (four) hours as needed (pain). , Starting Fri 01/23/2017, Historical Med    hydrocortisone 2.5 % cream Apply 1 application topically 2 (two) times daily as needed (rash). , Starting Thu 11/06/2016, Historical Med    pantoprazole (PROTONIX) 40 MG tablet Take 40 mg by mouth daily., Starting Mon 01/19/2017, Historical Med    polyethylene glycol (MIRALAX / GLYCOLAX) packet Take 34 g by mouth at bedtime. Mix 2 capfuls (34 grams) in 6-8 oz liquid and drink, Historical Med    Tetrahydrozoline HCl (VISINE OP) Place 1 drop into both eyes daily as needed (dry eyes/irritation)., Historical Med    triazolam (HALCION) 0.25 MG tablet Take 0.25 mg by mouth at bedtime., Starting Thu 01/22/2017, Historical Med      STOP taking these medications     levofloxacin (LEVAQUIN) 750 MG tablet      phentermine 15 MG capsule        Allergies  Allergen Reactions  . Cerefolin [L-Methylfolate-B12-B6-B2] Nausea Only  . Codeine Nausea And Vomiting and Other (See Comments)    dizziness  . Lactose Intolerance (Gi) Other (See Comments)    Constipation and bloating   Follow-up Information    Orpah Melter, MD. Schedule an appointment as soon as possible for a visit on 02/17/2017.   Specialty:  Family Medicine Why:  Appointment is on 02/17/17 at [facility] information: [place] [address] [place] Follow up.   Why:  rollator and 3 IN 1 / BSC will be delivered to room prior to  discharge Contact information: [place] [id number] [phone]        [facility], [facility] Follow up.   Why:  home health services arranged, office to call to set up home visits Contact information: [address] [place] [address] [phone]            The results of significant diagnostics from this hospitalization (including imaging, microbiology, ancillary and laboratory) are listed below for reference.    Significant Diagnostic Studies: Dg Chest 2 View  Result Date: 02/09/2017 CLINICAL DATA:  83 year old female treated for aspiration pneumonia for the past week. Symptoms improved until yesterday. Now with increased productive cough and slight shortness of breath. Subsequent encounter. EXAM: CHEST  2 VIEW COMPARISON:  02/06/2017, 02/03/2017 and 12/17/2015 chest x-ray. FINDINGS: Progressive consolidation left lung most consistent with left lower lobe infiltrate with left-sided pleural effusion. Follow-up until clearance recommended to exclude underlying lesion. Heart size top-normal to slightly enlarged. Calcified mildly tortuous aorta. Pulmonary vascular congestion. Surgery with fusion utilizing pedicle screws and posterior connecting bar with abnormal positioning of pedicle screws as previously noted. No new hardware abnormality or compression fracture detected. Degenerative changes shoulder joints bilaterally. IMPRESSION: Progressive consolidation  left lung most consistent with left lower lobe infiltrate with left-sided pleural effusion. Heart size top-normal to slightly enlarged. Pulmonary vascular congestion. Electronically Signed   By: Genia Del M.D.   On: 02/09/2017 09:34   Dg Chest 2 View  Result Date: 02/06/2017 CLINICAL DATA:  Cough and hypotension.  Pneumonia. EXAM: CHEST  2 VIEW COMPARISON:  Three days ago FINDINGS: Confluent airspace disease at the left base without convincing progression from prior. Trace pleural  effusions. No edema or pneumothorax. Normal heart size and aortic contours. IMPRESSION: 1. Presumed pneumonia on the left without change from prior. Recommend follow-up to clearing. 2. Trace pleural effusions. 3. Oral contrast in the gas distended colon, administered 3 days prior. Electronically Signed   By: Monte Fantasia M.D.   On: 02/06/2017 14:31   Dg Chest 2 View  Result Date: 02/03/2017 CLINICAL DATA:  Persistent productive cough and chest congestion with wheezing. EXAM: CHEST  2 VIEW COMPARISON:  PA and lateral chest x-ray of December 17, 2015 FINDINGS: The lungs are well-expanded. There is dense infiltrate in the anterior aspect of the left lower lobe. There is a tiny left pleural effusion. The right lung is clear. The heart is top-normal in size but stable. The pulmonary vascularity is normal. There is calcification in the wall of the aortic arch. The mediastinum is normal in width. The patient has undergone lower thoracic and lumbar spinal fusion there are degenerative changes of both shoulders. There is contrast within bowel loops in the upper and mid abdomen. IMPRESSION: Left lower lobe pneumonia. Followup PA and lateral chest X-ray is recommended in 3-4 weeks following trial of antibiotic therapy to ensure resolution and exclude underlying malignancy. Thoracic aortic atherosclerosis. Electronically Signed   By: David  Martinique M.D.   On: 02/03/2017 10:57   Dg Esophagus  Result Date: 02/03/2017 CLINICAL DATA:  Sensation of food set sticking in the high esophagus. Strangulation of water. EXAM: ESOPHOGRAM/BARIUM SWALLOW TECHNIQUE: Single contrast examination was performed using thin barium or water soluble. FLUOROSCOPY TIME:  Fluoroscopy Time:  2 minutes 30 seconds Radiation Exposure Index (if provided by the fluoroscopic device): 63 mGy Number of Acquired Spot Images: 15 COMPARISON:  None. FINDINGS: Exam limited due to patient limited mobility. No swallowing dysfunction in the high cervical esophagus.  No mucosal irregularity stricture or mass within the thoracic esophagus or distal esophagus. With patient prone position, there was poor initiation of the primary stripping wave. No tertiary contractions. No gastroesophageal reflux demonstrated. 13 mm barium tab passed GE junction easily IMPRESSION: 1. Esophageal dysmotility with poor initiation of the primary stripping wave. No tertiary contractions. 2. No esophageal mucosal irregularity, stricture or mass. 3. Barium tablet passed the GE junction easily. 4. No reflux demonstrated during the course exam. Of note exam was limited by patient's limited mobility. Electronically Signed   By: Suzy Bouchard M.D.   On: 02/03/2017 10:41   Dg Swallowing Func-speech Pathology  Result Date: 02/08/2017 Objective Swallowing Evaluation: Type of Study: MBS-Modified Barium Swallow Study Patient Details Name: KAELEE PFEFFER MRN: [id number] Date of Birth: 1930-06-05 Today's Date: 02/08/2017 Time: SLP Start Time (ACUTE ONLY): 1330-SLP Stop Time (ACUTE ONLY): 1400 SLP Time Calculation (min) (ACUTE ONLY): 30 min Past Medical History: Past Medical History: Diagnosis Date . Arthritis  . IBS (irritable bowel syndrome)  Past Surgical History: No past surgical history on file. HPI: YAREMI STAHLMAN a 83 y.o.femalewith PMh of osteoarthritis, IBS with constipation, ongoing issues with dysphagia, she has had trouble with her swallowing for  the last several months, she notes dysphagia liquids as well as solids,especially with harder texture solids, this has affected her PO intakeand resulted in weight loss. She just saw Dr. Teena Irani with [facility] recently who did an esophagram which is notable for esophageal dysmotility and no obstruction. Around the same time she started having cough productive of occasional streaks of blood, she saw her primary care doctor on 5/1 diagnosed her with pneumonia and she was subsequently started on Levaquin .Despite this continued to have profound  weakness requiring assistance with ADLs and hence was brought to the ED.CXR 02/03/17 showed left lower lobe pneumonia. No prior SLP swallowing evaluations in chart. Subjective: alert, cooperative, pleasant Assessment / Plan / Recommendation CHL IP CLINICAL IMPRESSIONS 02/08/2017 Clinical Impression Patient presents with suspected primary esophageal dysphagia and moderate pharyngoesophageal dysphagia with consistent penetration of thin, nectar and honey-thick liquids and moderate pharyngeal residue remaining after the swallow. Deviations in swallow physiology included thickening of cricopharyngeus, reduced duration and amplitude of UES opening which led to obstruction of bolus flow, poor stripping wave and moderate valleculae, mild pyriform and CP segment residue. Epiglottic deflection was incomplete, suspect given suboptimal pharyngeal pressure, leading to reduced airway protection and consistent penetration during the swallow as well as deep penetration of residue during subsequent swallows. Several compensatory strategies were trialed including chin tuck, which did not prevent penetration and increased pharyngeal residue, mendelsohn maneuver, which pt was unable to perform, and effortful dry swallow, which was marginally effective in reducing pyriform, CP segment residue but had no impact on vallecular residue given poor epiglottic movement. Whole barium tablet was retained in the valleculae; pt unable to clear despite max cues for effortful swallow, attempted mendelsohn manuever, puree, nectar or thin liquid wash. Silent aspiration of thin liquids x1 with multiple consecutive swallows in an effort to clear barium tablet from valleculae. Pt ultimately voluntarily coughed and expectorated tablet. While pt did not display frank aspiration of thickened liquids, she remains at high risk for aspiration of these due to increased residue as well as increased likelihood of reflux with thickened liquids. Spoke with pt at  length regarding her wishes for nutrition/hydration. Based on pt's desire to continue eating by mouth while minimizing risks for aspiration, recommend dys 3 diet (pt prefers softer foods) with thin liquids (water only) via single cup sips, with intermittent cough, effortful dry swallow to expel penetrate and reduce pharyngeal residue. Medications should be crushed in puree. Provided education re: risks for aspiration and role of strict oral care in reducing risks. Pt states she brushes her teeth frequently and uses a water pick at home. Advised pt to complete oral care before and after meals. SLP will f/u briefly for education.  SLP Visit Diagnosis Dysphagia, pharyngoesophageal phase (R13.14) Attention and concentration deficit following -- Frontal lobe and executive function deficit following -- Impact on safety and function Moderate aspiration risk;Severe aspiration risk   CHL IP TREATMENT RECOMMENDATION 02/08/2017 Treatment Recommendations Therapy as outlined in treatment plan below   Prognosis 02/08/2017 Prognosis for Safe Diet Advancement Fair Barriers to Reach Goals Other (Comment) Barriers/Prognosis Comment -- CHL IP DIET RECOMMENDATION 02/08/2017 SLP Diet Recommendations Free water protocol after oral care;Thin liquid;Dysphagia 3 (Mech soft) solids Liquid Administration via Cup Medication Administration Crushed with puree Compensations Slow rate;Small sips/bites;Other (Comment) Postural Changes --   CHL IP OTHER RECOMMENDATIONS 02/08/2017 Recommended Consults -- Oral Care Recommendations Oral care before and after PO Other Recommendations --   CHL IP FOLLOW UP RECOMMENDATIONS 02/08/2017 Follow up Recommendations Other (  comment)   CHL IP FREQUENCY AND DURATION 02/08/2017 Speech Therapy Frequency (ACUTE ONLY) min 1 x/week Treatment Duration 1 week      CHL IP ORAL PHASE 02/08/2017 Oral Phase WFL Oral - Pudding Teaspoon -- Oral - Pudding Cup -- Oral - Honey Teaspoon -- Oral - Honey Cup -- Oral - Nectar Teaspoon -- Oral -  Nectar Cup -- Oral - Nectar Straw -- Oral - Thin Teaspoon -- Oral - Thin Cup -- Oral - Thin Straw -- Oral - Puree -- Oral - Mech Soft -- Oral - Regular -- Oral - Multi-Consistency -- Oral - Pill -- Oral Phase - Comment --  CHL IP PHARYNGEAL PHASE 02/08/2017 Pharyngeal Phase Impaired Pharyngeal- Pudding Teaspoon -- Pharyngeal -- Pharyngeal- Pudding Cup -- Pharyngeal -- Pharyngeal- Honey Teaspoon Delayed swallow initiation-vallecula;Reduced airway/laryngeal closure;Reduced pharyngeal peristalsis;Reduced epiglottic inversion;Penetration/Aspiration during swallow;Penetration/Apiration after swallow;Pharyngeal residue - valleculae;Pharyngeal residue - pyriform;Pharyngeal residue - cp segment Pharyngeal Material enters airway, remains ABOVE vocal cords and not ejected out Pharyngeal- Honey Cup Delayed swallow initiation-vallecula;Reduced pharyngeal peristalsis;Reduced epiglottic inversion;Reduced airway/laryngeal closure;Penetration/Aspiration during swallow;Penetration/Apiration after swallow;Pharyngeal residue - valleculae;Pharyngeal residue - posterior pharnyx;Pharyngeal residue - cp segment Pharyngeal Material enters airway, CONTACTS cords and not ejected out Pharyngeal- Nectar Teaspoon Delayed swallow initiation-vallecula;Reduced pharyngeal peristalsis;Reduced airway/laryngeal closure;Penetration/Aspiration during swallow;Penetration/Apiration after swallow;Pharyngeal residue - pyriform;Pharyngeal residue - valleculae;Pharyngeal residue - cp segment Pharyngeal Material enters airway, remains ABOVE vocal cords and not ejected out Pharyngeal- Nectar Cup Delayed swallow initiation-vallecula;Reduced pharyngeal peristalsis;Reduced airway/laryngeal closure;Reduced epiglottic inversion;Penetration/Aspiration during swallow;Penetration/Apiration after swallow;Pharyngeal residue - valleculae;Pharyngeal residue - pyriform;Pharyngeal residue - cp segment Pharyngeal Material enters airway, CONTACTS cords and not ejected out  Pharyngeal- Nectar Straw -- Pharyngeal -- Pharyngeal- Thin Teaspoon Delayed swallow initiation-vallecula;Reduced epiglottic inversion;Reduced airway/laryngeal closure;Penetration/Aspiration during swallow;Penetration/Apiration after swallow;Pharyngeal residue - valleculae;Pharyngeal residue - pyriform;Pharyngeal residue - cp segment Pharyngeal Material enters airway, remains ABOVE vocal cords and not ejected out Pharyngeal- Thin Cup Delayed swallow initiation-vallecula;Reduced epiglottic inversion;Reduced pharyngeal peristalsis;Reduced airway/laryngeal closure;Penetration/Aspiration during swallow;Penetration/Apiration after swallow;Pharyngeal residue - valleculae;Pharyngeal residue - pyriform;Pharyngeal residue - cp segment Pharyngeal Material enters airway, CONTACTS cords and not ejected out Pharyngeal- Thin Straw -- Pharyngeal -- Pharyngeal- Puree Reduced airway/laryngeal closure;Pharyngeal residue - valleculae;Pharyngeal residue - pyriform;Pharyngeal residue - cp segment;Reduced pharyngeal peristalsis;Delayed swallow initiation-vallecula;Reduced epiglottic inversion Pharyngeal -- Pharyngeal- Mechanical Soft -- Pharyngeal -- Pharyngeal- Regular Delayed swallow initiation-pyriform sinuses;Reduced pharyngeal peristalsis;Reduced epiglottic inversion;Reduced airway/laryngeal closure;Pharyngeal residue - valleculae;Pharyngeal residue - cp segment Pharyngeal -- Pharyngeal- Multi-consistency Delayed swallow initiation-vallecula;Reduced epiglottic inversion;Reduced airway/laryngeal closure;Reduced pharyngeal peristalsis;Penetration/Aspiration during swallow;Penetration/Apiration after swallow;Moderate aspiration;Pharyngeal residue - valleculae;Pharyngeal residue - cp segment;Pharyngeal residue - pyriform Pharyngeal Material enters airway, passes BELOW cords without attempt by patient to eject out (silent aspiration) Pharyngeal- Pill Delayed swallow initiation-vallecula;Reduced epiglottic inversion;Reduced pharyngeal  peristalsis;Reduced airway/laryngeal closure;Pharyngeal residue - valleculae Pharyngeal -- Pharyngeal Comment --  CHL IP CERVICAL ESOPHAGEAL PHASE 02/08/2017 Cervical Esophageal Phase Impaired Pudding Teaspoon -- Pudding Cup -- Honey Teaspoon -- Honey Cup -- Nectar Teaspoon -- Nectar Cup -- Nectar Straw -- Thin Teaspoon -- Thin Cup -- Thin Straw -- Puree -- Mechanical Soft -- Regular -- Multi-consistency -- Pill -- Cervical Esophageal Comment reduced cricopharyngeal relaxation, prominent CP segment, consistent CP residue and retention of material in the cervical esophagus No flowsheet data found. Aliene Altes 02/08/2017, 3:44 PM  Deneise Lever, [place], [place] Speech-Language Pathologist [phone]              Microbiology: No results found for this or any previous visit (from the past 240 hour(s)).   Labs: Basic Metabolic  Panel: No results for input(s): NA, K, CL, CO2, GLUCOSE, BUN, CREATININE, CALCIUM, MG, PHOS in the last 168 hours. Liver Function Tests: No results for input(s): AST, ALT, ALKPHOS, BILITOT, PROT, ALBUMIN in the last 168 hours. No results for input(s): LIPASE, AMYLASE in the last 168 hours. No results for input(s): AMMONIA in the last 168 hours. CBC: No results for input(s): WBC, NEUTROABS, HGB, HCT, MCV, PLT in the last 168 hours. Cardiac Enzymes: No results for input(s): CKTOTAL, CKMB, CKMBINDEX, TROPONINI in the last 168 hours. BNP: BNP (last 3 results) No results for input(s): BNP in the last 8760 hours.  ProBNP (last 3 results) No results for input(s): PROBNP in the last 8760 hours.  CBG: No results for input(s): GLUCAP in the last 168 hours.     SignedDomenic Polite MD.  [facility] 02/18/2017, 3:57 PM

## 2017-03-13 ENCOUNTER — Other Ambulatory Visit: Payer: Self-pay | Admitting: Physician Assistant

## 2017-03-13 DIAGNOSIS — K624 Stenosis of anus and rectum: Secondary | ICD-10-CM

## 2017-03-13 DIAGNOSIS — K625 Hemorrhage of anus and rectum: Secondary | ICD-10-CM

## 2017-03-13 DIAGNOSIS — D649 Anemia, unspecified: Secondary | ICD-10-CM

## 2017-03-13 DIAGNOSIS — R634 Abnormal weight loss: Secondary | ICD-10-CM

## 2017-03-17 ENCOUNTER — Other Ambulatory Visit: Payer: Self-pay | Admitting: Internal Medicine

## 2017-03-17 DIAGNOSIS — J189 Pneumonia, unspecified organism: Secondary | ICD-10-CM

## 2017-03-18 ENCOUNTER — Ambulatory Visit
Admission: RE | Admit: 2017-03-18 | Discharge: 2017-03-18 | Disposition: A | Discharge status: Home / Self Care | Payer: Medicare Other | Source: Ambulatory Visit | Attending: Physician Assistant | Admitting: Physician Assistant

## 2017-03-18 DIAGNOSIS — K625 Hemorrhage of anus and rectum: Secondary | ICD-10-CM

## 2017-03-18 DIAGNOSIS — K624 Stenosis of anus and rectum: Secondary | ICD-10-CM

## 2017-03-18 DIAGNOSIS — R634 Abnormal weight loss: Secondary | ICD-10-CM

## 2017-03-18 DIAGNOSIS — D649 Anemia, unspecified: Secondary | ICD-10-CM

## 2017-03-18 MED ORDER — IOPAMIDOL (ISOVUE-300) INJECTION 61%
100.0000 mL | Freq: Once | INTRAVENOUS | Status: AC | PRN
  Administered 2017-03-18: 100 mL via INTRAVENOUS

## 2017-04-03 ENCOUNTER — Ambulatory Visit
Admission: RE | Admit: 2017-04-03 | Discharge: 2017-04-03 | Disposition: A | Discharge status: Home / Self Care | Payer: Medicare Other | Source: Ambulatory Visit | Attending: Internal Medicine | Admitting: Internal Medicine

## 2017-04-03 DIAGNOSIS — J189 Pneumonia, unspecified organism: Secondary | ICD-10-CM

## 2017-04-03 MED ORDER — IOPAMIDOL (ISOVUE-300) INJECTION 61%
75.0000 mL | Freq: Once | INTRAVENOUS | Status: AC | PRN
  Administered 2017-04-03: 75 mL via INTRAVENOUS

## 2017-04-04 ENCOUNTER — Inpatient Hospital Stay (HOSPITAL_COMMUNITY)
Admission: EM | Admit: 2017-04-04 | Discharge: 2017-04-07 | DRG: 177 | Disposition: A | Discharge status: Home / Self Care | Payer: Medicare Other | Attending: Internal Medicine | Admitting: Internal Medicine

## 2017-04-04 ENCOUNTER — Encounter (HOSPITAL_COMMUNITY): Payer: Self-pay | Admitting: *Deleted

## 2017-04-04 DIAGNOSIS — J181 Lobar pneumonia, unspecified organism: Secondary | ICD-10-CM | POA: Diagnosis not present

## 2017-04-04 DIAGNOSIS — J690 Pneumonitis due to inhalation of food and vomit: Principal | ICD-10-CM | POA: Diagnosis present

## 2017-04-04 DIAGNOSIS — R05 Cough: Secondary | ICD-10-CM

## 2017-04-04 DIAGNOSIS — J189 Pneumonia, unspecified organism: Secondary | ICD-10-CM | POA: Diagnosis present

## 2017-04-04 DIAGNOSIS — D649 Anemia, unspecified: Secondary | ICD-10-CM | POA: Diagnosis present

## 2017-04-04 DIAGNOSIS — G8929 Other chronic pain: Secondary | ICD-10-CM | POA: Diagnosis present

## 2017-04-04 DIAGNOSIS — M1990 Unspecified osteoarthritis, unspecified site: Secondary | ICD-10-CM | POA: Diagnosis present

## 2017-04-04 DIAGNOSIS — Z6820 Body mass index (BMI) 20.0-20.9, adult: Secondary | ICD-10-CM

## 2017-04-04 DIAGNOSIS — R0602 Shortness of breath: Secondary | ICD-10-CM | POA: Diagnosis present

## 2017-04-04 DIAGNOSIS — R1310 Dysphagia, unspecified: Secondary | ICD-10-CM | POA: Diagnosis present

## 2017-04-04 DIAGNOSIS — K589 Irritable bowel syndrome without diarrhea: Secondary | ICD-10-CM | POA: Diagnosis not present

## 2017-04-04 DIAGNOSIS — R634 Abnormal weight loss: Secondary | ICD-10-CM | POA: Diagnosis not present

## 2017-04-04 DIAGNOSIS — R64 Cachexia: Secondary | ICD-10-CM | POA: Diagnosis present

## 2017-04-04 DIAGNOSIS — J9601 Acute respiratory failure with hypoxia: Secondary | ICD-10-CM | POA: Diagnosis present

## 2017-04-04 DIAGNOSIS — Z79899 Other long term (current) drug therapy: Secondary | ICD-10-CM

## 2017-04-04 DIAGNOSIS — K219 Gastro-esophageal reflux disease without esophagitis: Secondary | ICD-10-CM | POA: Diagnosis present

## 2017-04-04 DIAGNOSIS — J90 Pleural effusion, not elsewhere classified: Secondary | ICD-10-CM | POA: Diagnosis present

## 2017-04-04 DIAGNOSIS — Z8701 Personal history of pneumonia (recurrent): Secondary | ICD-10-CM

## 2017-04-04 DIAGNOSIS — R059 Cough, unspecified: Secondary | ICD-10-CM

## 2017-04-04 DIAGNOSIS — E43 Unspecified severe protein-calorie malnutrition: Secondary | ICD-10-CM | POA: Diagnosis present

## 2017-04-04 HISTORY — DX: Pneumonia, unspecified organism: J18.9

## 2017-04-04 LAB — CBC
HCT: 27.2 % — ABNORMAL LOW (ref 36.0–46.0)
Hemoglobin: 8.9 g/dL — ABNORMAL LOW (ref 12.0–15.0)
MCH: 32.6 pg (ref 26.0–34.0)
MCHC: 32.7 g/dL (ref 30.0–36.0)
MCV: 99.6 fL (ref 78.0–100.0)
Platelets: 522 10*3/uL — ABNORMAL HIGH (ref 150–400)
RBC: 2.73 MIL/uL — ABNORMAL LOW (ref 3.87–5.11)
RDW: 13.9 % (ref 11.5–15.5)
WBC: 7.2 10*3/uL (ref 4.0–10.5)

## 2017-04-04 LAB — BASIC METABOLIC PANEL
Anion gap: 10 (ref 5–15)
BUN: 17 mg/dL (ref 6–20)
CO2: 25 mmol/L (ref 22–32)
Calcium: 9.4 mg/dL (ref 8.9–10.3)
Chloride: 100 mmol/L — ABNORMAL LOW (ref 101–111)
Creatinine, Ser: 1.02 mg/dL — ABNORMAL HIGH (ref 0.44–1.00)
GFR calc Af Amer: 56 mL/min — ABNORMAL LOW (ref >60)
GFR calc non Af Amer: 49 mL/min — ABNORMAL LOW (ref >60)
Glucose, Bld: 106 mg/dL — ABNORMAL HIGH (ref 65–99)
Potassium: 4.1 mmol/L (ref 3.5–5.1)
Sodium: 135 mmol/L (ref 135–145)

## 2017-04-04 LAB — I-STAT TROPONIN, ED: Troponin i, poc: 0 ng/mL (ref 0.00–0.08)

## 2017-04-04 MED ORDER — TRIAZOLAM 0.125 MG PO TABS
0.2500 mg | ORAL_TABLET | Freq: Every day | ORAL | Status: DC
  Administered 2017-04-04 – 2017-04-06 (×3): 0.25 mg via ORAL
  Filled 2017-04-04 (×3): qty 2

## 2017-04-04 MED ORDER — HYDROCODONE-ACETAMINOPHEN 5-325 MG PO TABS
1.0000 | ORAL_TABLET | Freq: Four times a day (QID) | ORAL | Status: DC | PRN
  Administered 2017-04-04: 1 via ORAL
  Filled 2017-04-04: qty 1

## 2017-04-04 MED ORDER — CELECOXIB 200 MG PO CAPS
200.0000 mg | ORAL_CAPSULE | Freq: Two times a day (BID) | ORAL | Status: DC
  Administered 2017-04-04 – 2017-04-07 (×6): 200 mg via ORAL
  Filled 2017-04-04 (×7): qty 1

## 2017-04-04 MED ORDER — TETRAHYDROZOLINE HCL 0.05 % OP SOLN
1.0000 [drp] | Freq: Every day | OPHTHALMIC | Status: DC | PRN
  Filled 2017-04-04: qty 15

## 2017-04-04 MED ORDER — POLYETHYLENE GLYCOL 3350 17 G PO PACK
34.0000 g | PACK | Freq: Every day | ORAL | Status: DC
  Administered 2017-04-04 – 2017-04-05 (×2): 34 g via ORAL
  Filled 2017-04-04 (×2): qty 2

## 2017-04-04 MED ORDER — ROPINIROLE HCL 0.25 MG PO TABS
0.2500 mg | ORAL_TABLET | Freq: Every day | ORAL | Status: DC | PRN
  Administered 2017-04-07: 0.25 mg via ORAL
  Filled 2017-04-04 (×2): qty 1

## 2017-04-04 MED ORDER — HYDROCORTISONE 1 % EX CREA
1.0000 "application " | TOPICAL_CREAM | Freq: Two times a day (BID) | CUTANEOUS | Status: DC | PRN
  Filled 2017-04-04: qty 28

## 2017-04-04 MED ORDER — DEXTROSE 5 % IV SOLN
1.0000 g | Freq: Once | INTRAVENOUS | Status: AC
  Administered 2017-04-04: 1 g via INTRAVENOUS
  Filled 2017-04-04: qty 1

## 2017-04-04 MED ORDER — DULOXETINE HCL 60 MG PO CPEP
60.0000 mg | ORAL_CAPSULE | Freq: Every day | ORAL | Status: DC
  Administered 2017-04-04 – 2017-04-06 (×3): 60 mg via ORAL
  Filled 2017-04-04 (×3): qty 1

## 2017-04-04 MED ORDER — TRIAZOLAM 0.25 MG PO TABS: 0.2500 mg | ORAL_TABLET | Freq: Every day | ORAL | Status: DC

## 2017-04-04 MED ORDER — VANCOMYCIN HCL 500 MG IV SOLR: 500.0000 mg | INTRAVENOUS | Status: DC

## 2017-04-04 MED ORDER — VANCOMYCIN HCL IN DEXTROSE 1-5 GM/200ML-% IV SOLN: 1000.0000 mg | Freq: Once | INTRAVENOUS | Status: DC

## 2017-04-04 MED ORDER — ENOXAPARIN SODIUM 40 MG/0.4ML ~~LOC~~ SOLN
40.0000 mg | SUBCUTANEOUS | Status: DC
  Administered 2017-04-04: 40 mg via SUBCUTANEOUS
  Filled 2017-04-04: qty 0.4

## 2017-04-04 MED ORDER — PANTOPRAZOLE SODIUM 40 MG PO TBEC
40.0000 mg | DELAYED_RELEASE_TABLET | Freq: Every day | ORAL | Status: DC
  Administered 2017-04-04 – 2017-04-07 (×4): 40 mg via ORAL
  Filled 2017-04-04 (×4): qty 1

## 2017-04-04 MED ORDER — PIPERACILLIN-TAZOBACTAM 3.375 G IVPB
3.3750 g | Freq: Three times a day (TID) | INTRAVENOUS | Status: DC
  Administered 2017-04-05 – 2017-04-07 (×6): 3.375 g via INTRAVENOUS
  Filled 2017-04-04 (×9): qty 50

## 2017-04-04 NOTE — ED Provider Notes (Signed)
[facility] DEPT Provider Note   CSN: [id number] Arrival date & time: 04/04/17  1143     History   Chief Complaint Chief Complaint  Patient presents with  . Cough  . Shortness of Breath    HPI [name] is a 83 y.o. female.  HPI   Patient is a 83 year old female with history of arthritis and IBS who presents to the ED from home with complaint of worsening productive cough. Patient notes she was admitted to the hospital 5 weeks ago for pneumonia. She states she was discharged home on antibiotics which she finished approximately a week ago with improvement of symptoms. She notes over the past week she began redeveloping cough, chest congestion and mild shortness of breath which she notes continued to worsen throughout the week. She states she was evaluated by her primary care provider 3 days ago, was started on doxycycline which she has been taking as prescribed and had a CT chest performed yesterday. Patient reports last night she had worsening productive cough with green/yellow sputum and one episode of small amount bright red streaky blood in sputum. She also reports having worsening shortness of breath on exertion and chest congestion. Denies fever, chills, headache, lightheadedness, chest pain, wheezing, palpitations, abdominal pain, vomiting, weakness, rash, leg swelling. Patient denies smoking. Denies history of cardiac disease. Denies any known sick contacts.  Past Medical History:  Diagnosis Date  . Arthritis   . IBS (irritable bowel syndrome)   . Pneumonia     Patient Active Problem List   Diagnosis Date Noted  . Pneumonia 04/04/2017  . Aspiration pneumonia ([place]) 02/10/2017  . Dysphagia 02/06/2017  . Sepsis due to undetermined organism ([place]) 02/06/2017  . Osteoarthritis 02/06/2017  . Sepsis ([place]) 02/06/2017    History reviewed. No pertinent surgical history.  OB History    No data available       Home Medications    Prior to Admission medications     Medication Sig Start Date End Date Taking? Authorizing Provider  celecoxib (CELEBREX) 200 MG capsule Take 200 mg by mouth 2 (two) times daily. 01/10/17  Yes [provider]  doxycycline (VIBRAMYCIN) 100 MG capsule Take 100 mg by mouth 2 (two) times daily.   Yes [provider]  DULoxetine (CYMBALTA) 60 MG capsule Take 60 mg by mouth at bedtime.  12/22/16  Yes [provider]  HYDROcodone-acetaminophen (NORCO) 7.5-325 MG tablet Take 1 tablet by mouth every 4 (four) hours as needed (pain).  01/23/17  Yes [provider]  hydrocortisone 2.5 % cream Apply 1 application topically 2 (two) times daily as needed (rash).  11/06/16  Yes [provider]  pantoprazole (PROTONIX) 40 MG tablet Take 40 mg by mouth daily. 01/19/17  Yes [provider]  polyethylene glycol (MIRALAX / GLYCOLAX) packet Take 34 g by mouth at bedtime. Mix 2 capfuls (34 grams) in 6-8 oz liquid and drink   Yes [provider]  rOPINIRole (REQUIP) 0.25 MG tablet Take 1 tablet (0.25 mg total) by mouth daily as needed (severe restless legs). 02/10/17  Yes Domenic Polite, MD  Tetrahydrozoline HCl (VISINE OP) Place 1 drop into both eyes daily as needed (dry eyes/irritation).   Yes [provider]  triazolam (HALCION) 0.25 MG tablet Take 0.25 mg by mouth at bedtime. 01/22/17  Yes [provider]  amoxicillin-clavulanate (AUGMENTIN) 875-125 MG tablet Take 1 tablet by mouth every 12 (twelve) hours. For 4 days Patient not taking: Reported on 04/04/2017 02/10/17   Domenic Polite,  MD  benzonatate (TESSALON) 100 MG capsule Take 100 mg by mouth 3 (three) times daily as needed for cough.  02/03/17   [provider]    Family History History reviewed. No pertinent family history.  Social History Social History  Substance Use Topics  . Smoking status: Never Smoker  . Smokeless tobacco: Never Used  . Alcohol use No     Allergies   Cerefolin  [l-methylfolate-b12-b6-b2]; Codeine; and Lactose intolerance (gi)   Review of Systems Review of Systems  Respiratory: Positive for cough, chest tightness and shortness of breath.   All other systems reviewed and are negative.    Physical Exam Updated Vital Signs BP 113/69   Pulse 90   Temp 97.9 F (36.6 C) (Oral)   Resp 20   Ht 4\' 11"  (1.499 m)   Wt 46.7 kg (103 lb)   SpO2 96%   BMI 20.80 kg/m   Physical Exam  Constitutional: She is oriented to person, place, and time. She appears well-developed and well-nourished. No distress.  HENT:  Head: Normocephalic and atraumatic.  Mouth/Throat: Oropharynx is clear and moist. No oropharyngeal exudate.  Eyes: Conjunctivae and EOM are normal. Pupils are equal, round, and reactive to light. Right eye exhibits no discharge. Left eye exhibits no discharge. No scleral icterus.  Neck: Normal range of motion. Neck supple.  Cardiovascular: Normal rate, regular rhythm, normal heart sounds and intact distal pulses.   Pulmonary/Chest: Effort normal and breath sounds normal. No respiratory distress. She has no wheezes. She has no rales. She exhibits no tenderness.  Abdominal: Soft. Bowel sounds are normal. She exhibits no distension and no mass. There is no tenderness. There is no rebound and no guarding.  Musculoskeletal: Normal range of motion. She exhibits no edema.  Neurological: She is alert and oriented to person, place, and time.  Skin: Skin is warm and dry. She is not diaphoretic.  Nursing note and vitals reviewed.    ED Treatments / Results  Labs (all labs ordered are listed, but only abnormal results are displayed) Labs Reviewed  BASIC METABOLIC PANEL - Abnormal; Notable for the following:       Result Value   Chloride 100 (*)    Glucose, Bld 106 (*)    Creatinine, Ser 1.02 (*)    GFR calc non Af Amer 49 (*)    GFR calc Af Amer 56 (*)    All other components within normal limits  CBC - Abnormal; Notable for the following:     RBC 2.73 (*)    Hemoglobin 8.9 (*)    HCT 27.2 (*)    Platelets 522 (*)    All other components within normal limits  CULTURE, BLOOD (ROUTINE X 2)  CULTURE, BLOOD (ROUTINE X 2)  I-STAT TROPOININ, ED    EKG  EKG Interpretation None       Radiology Ct Chest W Contrast  Result Date: 04/03/2017 CLINICAL DATA:  Recurrent pneumonia for 3 months. EXAM: CT CHEST WITH CONTRAST TECHNIQUE: Multidetector CT imaging of the chest was performed during intravenous contrast administration. CONTRAST:  52mL ISOVUE-300 IOPAMIDOL (ISOVUE-300) INJECTION 61% COMPARISON:  CT abdomen dated 03/18/2017. Chest x-ray dated 03/03/2017. FINDINGS: Cardiovascular: Cardiomegaly.  No pericardial effusion. Mild aortic atherosclerosis.  No aortic aneurysm or dissection. Mediastinum/Nodes: Small lymph nodes within the mediastinum, fairly numerous, largest within the subcarinal space with 1.1 cm short axis dimension. Esophagus is unremarkable. Trachea and central bronchi are unremarkable. Lungs/Pleura: New right pleural effusion, moderate to large in size, with associated  compressive atelectasis at the right lung base. New small left pleural effusion. Rounded masslike consolidation within the left lower lobe, abutting the fissure, measuring 5.2 cm craniocaudal dimension, 3.3 cm transverse dimension and 2.6 cm AP dimension (series 2, image 85), pneumonia versus neoplastic mass versus rounded atelectasis. Upper Abdomen: No acute findings on limited views of the upper abdomen. Musculoskeletal: Extensive degenerative change at each shoulder, with extensive soft tissue thickening and fluid at each joint space, right greater than left, with associated subluxation of the right humeral head relative to the glenoid fossa, presumably chronic inflammatory arthritis. IMPRESSION: 1. Rounded masslike consolidation within the left lower lobe, measuring 5.2 x 3.3 x 2.6 cm. Differential considerations include pneumonia, neoplastic mass with central  necrosis and rounded atelectasis with trapped fluid. At minimum, recommend follow-up CT to ensure resolution. Could also consider further characterization with PET-CT or tissue sampling. 2. New moderate to large right pleural effusion. New small left pleural effusion. 3. Small lymph nodes within the mediastinum, fairly numerous but none of which are pathologic by CT size criteria, likely reactive, neoplastic lymphadenopathy not excluded. 4. Cardiomegaly. 5. Marked degenerative change at each shoulder, with extensive soft tissue thickening within and surrounding each joint space and associated fluid in the joint space, right greater than left, incompletely imaged, presumed chronic inflammatory arthritis. Recommend correlation for any acute sequela. Consider plain film correlation if any acute symptoms. Aortic Atherosclerosis (ICD10-I70.0). These results will be called to the ordering clinician or representative by the Radiologist Assistant, and communication documented in the PACS or zVision Dashboard. Electronically Signed   By: Franki Cabot M.D.   On: 04/03/2017 15:25    Procedures Procedures (including critical care time)  Medications Ordered in ED Medications  ceFEPIme (MAXIPIME) 1 g in dextrose 5 % 50 mL IVPB (not administered)  vancomycin (VANCOCIN) IVPB 1000 mg/200 mL premix (not administered)  vancomycin (VANCOCIN) 500 mg in sodium chloride 0.9 % 100 mL IVPB (not administered)     Initial Impression / Assessment and Plan / ED Course  I have reviewed the triage vital signs and the nursing notes.  Pertinent labs & imaging results that were available during my care of the patient were reviewed by me and considered in my medical decision making (see chart for details).     Patient presents with worsening productive cough, shortness of breath and chest congestion over the past week. Reports being admitted last month for pneumonia and states she finished her antibiotics outpatient with  improvement of symptoms until they return this week. Denies fever or chest pain. VSS. Exam unremarkable, patient without signs of respiratory distress or increased work of breathing.  Chart review shows patient was initially seen by her PCP on 02/03/17, diagnosed with pneumonia and started on Levaquin. Pt came to the ED on 02/06/17 for worsening weakness and was admitted for sepsis/aspiration pneumonia and d/c home 5/8 with PO antibiotics. Pt with hx of dysphagia and had esophogram showing esophageal dysmotility. Outpatient CT chest performed yesterday showed "Rounded masslike consolidation within the left lower lobe, measuring 5.2 x 3.3 x 2.6 cm. Differential considerations include pneumonia, neoplastic mass with central necrosis and rounded atelectasis with trapped fluid; new moderate to large right pleural effusion and small left pleural effusion; small lymph nodes within mediastinum; cardiomegaly."  EKG showed sinus rhythm with RBBB and prolonged QT. Trop negative. Remaining labs unremarkable. Labs unremarkable. Discussed pt with Dr. Wilson Singer. Suspect consolidation may be due to recurrent pneumonia, pt started on IV abx in the ED. Plan to  admit to medicine for further workup/management. Dr. Nehemiah Settle agrees to admission. Discussed results and plan for admission with pt and family.  Final Clinical Impressions(s) / ED Diagnoses   Final diagnoses:  Lung consolidation ([place])  Cough  Shortness of breath    New Prescriptions New Prescriptions   No medications on file     Nona Dell, Hershal Coria 04/04/17 1606    Virgel Manifold, MD 04/12/17 1739

## 2017-04-04 NOTE — Progress Notes (Signed)
Pharmacy Antibiotic Note  [name] is a 83 y.o. female admitted on 04/04/2017 with pneumonia.  Pharmacy has been consulted for vancomycin dosing. Pt is afebrile and WBC is WNL. SCr is WNL. Pt was on doxycycline PTA. Also received a 1x dose of cefepime.   Plan: Vanc 1gm IV x 1 then 500mg  IV Q24H F/u renal fxn, C&S, clinical status and trough at [facility] F/u continuation of cefepime or other gram negative coverage  Height: 4\' 11"  (149.9 cm) Weight: 103 lb (46.7 kg) IBW/kg (Calculated) : 43.2  Temp (24hrs), Avg:97.9 F (36.6 C), Min:97.9 F (36.6 C), Max:97.9 F (36.6 C)   Recent Labs Lab 04/04/17 1149  WBC 7.2  CREATININE 1.02*    Estimated Creatinine Clearance: 27.5 mL/min (A) (by C-G formula based on SCr of 1.02 mg/dL (H)).    Allergies  Allergen Reactions  . Cerefolin [L-Methylfolate-B12-B6-B2] Nausea Only  . Codeine Nausea And Vomiting and Other (See Comments)    dizziness  . Lactose Intolerance (Gi) Other (See Comments)    Constipation and bloating    Antimicrobials this admission: Vanc 6/30>> Cefepime x 1 6/30  Dose adjustments this admission: N/A  Microbiology results: Pending  Thank you for allowing pharmacy to be a part of this patient's care.  Effrey Davidow, Rande Lawman 04/04/2017 3:26 PM

## 2017-04-04 NOTE — ED Notes (Signed)
Patient ambulated to the restroom. Patient placed back on cardiac monitor.

## 2017-04-04 NOTE — Progress Notes (Addendum)
Pharmacy Antibiotic Note  [name] is a 83 y.o. female admitted on 04/04/2017 with pneumonia.  Pharmacy has been consulted for vancomycin dosing. Pt is afebrile and WBC is WNL. SCr is WNL. Pt was on doxycycline PTA. Also received a 1x dose of cefepime.   Plan: Vanc 1gm IV x 1 then 500mg  IV Q24H F/u renal fxn, C&S, clinical status and trough at [facility] F/u continuation of cefepime or other gram negative coverage  Rumbarger, Rande Lawman 04/04/2017 3:26 PM  ADDENDUM:  Vancomycin d/c'd. Pharmacy consulted to dose Zosyn for PNA. Received 1x dose of cefepime today at 1644. CrCl~28.  Plan: D/c vancomycin Zosyn 3.375g IV q8h (4h inf) to start tomorrow Monitor clinical progress, c/s, renal function F/u de-escalation plan/LOT  Elicia Lamp, PharmD, BCPS Clinical Pharmacist 04/04/2017 5:25 PM    Height: 4\' 11"  (149.9 cm) Weight: 103 lb (46.7 kg) IBW/kg (Calculated) : 43.2  Temp (24hrs), Avg:97.9 F (36.6 C), Min:97.9 F (36.6 C), Max:97.9 F (36.6 C)   Recent Labs Lab 04/04/17 1149  WBC 7.2  CREATININE 1.02*    Estimated Creatinine Clearance: 27.5 mL/min (A) (by C-G formula based on SCr of 1.02 mg/dL (H)).    Allergies  Allergen Reactions  . Cerefolin [L-Methylfolate-B12-B6-B2] Nausea Only  . Codeine Nausea And Vomiting and Other (See Comments)    dizziness  . Lactose Intolerance (Gi) Other (See Comments)    Constipation and bloating

## 2017-04-04 NOTE — ED Triage Notes (Signed)
Pt reports recent pneumonia approx 3 weeks ago. Having increase in productive cough with green and bloody sputum, upper chest discomfort and sob. Pt denies fever. Had tb test done at pcp and it was negative. Had outpatient ct scan done of chest yesterday. No acute resp distress is noted at triage.

## 2017-04-04 NOTE — H&P (Addendum)
History and Physical  [name] [id number]:[id number] DOB: 23-Aug-1930 DOA: 04/04/2017  Referring physician: Harlene Ramus, Hershal Coria, ED provider PCP: Orpah Melter, MD  Outpatient Specialists: none  Patient Coming From: home  Chief Complaint: cough  HPI: [name] is a 83 y.o. female with a history of IBS, arthritis. Patient presents with worsening productive cough with blood-streaked sputum. Symptoms started and beginning of May. Patient was admitted from 5/4 until 5/8 with community acquired pneumonia was discharged on Augmentin. She continued to have cough but felt improved over the next couple of weeks, but became worse again. She was started on doxycycline, which she completed and was again improving after the course of antibiotics, but worsened earlier this week. She saw her primary care physician and was restarted on doxycycline, however her symptoms continue to worsen. She continues to have purulent sputum streaked with blood. She also admits to approximately 25 pound weight loss over the past year. She is a nonsmoker. No other palliating or provoking factors.  She did have a CT of her chest yesterday which showed a rounded masslike consolidation within the left lower lobe measuring approximately 5.2 x 3.3 x 2.6 cm.  Emergency Department Course: Patient started on Vanco and cefepime. White count was 7.2 creatinine 1.02. Blood cultures drawn prior to receiving antibiotics.  Review of Systems:   Complains of intermittent abdominal pain consistent with her known IBS.  Pt denies any fevers, chills, night sweats, nausea, vomiting, diarrhea, constipation, abdominal pain, shortness of breath, dyspnea on exertion, orthopnea, palpitations, headache, vision changes, lightheadedness, dizziness, melena, rectal bleeding.  Review of systems are otherwise negative  Past Medical History:  Diagnosis Date  . Arthritis   . IBS (irritable bowel syndrome)   . Pneumonia    History reviewed. No  pertinent surgical history. Social History:  reports that she has never smoked. She has never used smokeless tobacco. She reports that she does not drink alcohol or use drugs. Patient lives at home  Allergies  Allergen Reactions  . Cerefolin [L-Methylfolate-B12-B6-B2] Nausea Only  . Codeine Nausea And Vomiting and Other (See Comments)    dizziness  . Lactose Intolerance (Gi) Other (See Comments)    Constipation and bloating    No family history of colon cancer or esophageal cancer in the family.  Prior to Admission medications   Medication Sig Start Date End Date Taking? Authorizing Provider  celecoxib (CELEBREX) 200 MG capsule Take 200 mg by mouth 2 (two) times daily. 01/10/17  Yes [provider]  doxycycline (VIBRAMYCIN) 100 MG capsule Take 100 mg by mouth 2 (two) times daily.   Yes [provider]  DULoxetine (CYMBALTA) 60 MG capsule Take 60 mg by mouth at bedtime.  12/22/16  Yes [provider]  HYDROcodone-acetaminophen (NORCO) 7.5-325 MG tablet Take 1 tablet by mouth every 4 (four) hours as needed (pain).  01/23/17  Yes [provider]  hydrocortisone 2.5 % cream Apply 1 application topically 2 (two) times daily as needed (rash).  11/06/16  Yes [provider]  pantoprazole (PROTONIX) 40 MG tablet Take 40 mg by mouth daily. 01/19/17  Yes [provider]  polyethylene glycol (MIRALAX / GLYCOLAX) packet Take 34 g by mouth at bedtime. Mix 2 capfuls (34 grams) in 6-8 oz liquid and drink   Yes [provider]  rOPINIRole (REQUIP) 0.25 MG tablet Take 1 tablet (0.25 mg total) by mouth daily as needed (severe restless legs). 02/10/17  Yes Domenic Polite, MD  Tetrahydrozoline HCl ([place] OP) Place  1 drop into both eyes daily as needed (dry eyes/irritation).   Yes [provider]  triazolam (HALCION) 0.25 MG tablet Take 0.25 mg by mouth at bedtime. 01/22/17  Yes [provider]  amoxicillin-clavulanate (AUGMENTIN) 875-125  MG tablet Take 1 tablet by mouth every 12 (twelve) hours. For 4 days Patient not taking: Reported on 04/04/2017 02/10/17   Domenic Polite, MD  benzonatate (TESSALON) 100 MG capsule Take 100 mg by mouth 3 (three) times daily as needed for cough.  02/03/17   [provider]    Physical Exam: BP 113/69   Pulse 90   Temp 97.9 F (36.6 C) (Oral)   Resp 20   Ht 4\' 11"  (1.499 m)   Wt 46.7 kg (103 lb)   SpO2 96%   BMI 20.80 kg/m   General: Elderly Caucasian female. Awake and alert and oriented x3. No acute cardiopulmonary distress.  HEENT: Normocephalic atraumatic.  Right and left ears normal in appearance.  Pupils equal, round, reactive to light. Extraocular muscles are intact. Sclerae anicteric and noninjected.  Moist mucosal membranes. No mucosal lesions.  Neck: Neck supple without lymphadenopathy. No carotid bruits. No masses palpated.  Cardiovascular: Regular rate with normal S1-S2 sounds. No murmurs, rubs, gallops auscultated. No JVD.  Respiratory: Rales in the left lower lobe. Good respiratory effort. No accessory muscle use.  Abdomen: Soft, nontender, nondistended. Active bowel sounds. No masses or hepatosplenomegaly  Skin: No rashes, lesions, or ulcerations.  Dry, warm to touch. 2+ dorsalis pedis and radial pulses. Musculoskeletal: No calf or leg pain. All major joints not erythematous nontender.  No upper or lower joint deformation.  Good ROM.  No contractures  Psychiatric: Intact judgment and insight. Pleasant and cooperative. Neurologic: No focal neurological deficits. Strength is 5/5 and symmetric in upper and lower extremities.  Cranial nerves II through XII are grossly intact.           Labs on Admission: I have personally reviewed following labs and imaging studies  CBC:  Recent Labs Lab 04/04/17 1149  WBC 7.2  HGB 8.9*  HCT 27.2*  MCV 99.6  PLT 101*   Basic Metabolic Panel:  Recent Labs Lab 04/04/17 1149  NA 135  K 4.1  CL 100*  CO2 25  GLUCOSE 106*    BUN 17  CREATININE 1.02*  CALCIUM 9.4   GFR: Estimated Creatinine Clearance: 27.5 mL/min (A) (by C-G formula based on SCr of 1.02 mg/dL (H)). Liver Function Tests: No results for input(s): AST, ALT, ALKPHOS, BILITOT, PROT, ALBUMIN in the last 168 hours. No results for input(s): LIPASE, AMYLASE in the last 168 hours. No results for input(s): AMMONIA in the last 168 hours. Coagulation Profile: No results for input(s): INR, PROTIME in the last 168 hours. Cardiac Enzymes: No results for input(s): CKTOTAL, CKMB, CKMBINDEX, TROPONINI in the last 168 hours. BNP (last 3 results) No results for input(s): PROBNP in the last 8760 hours. HbA1C: No results for input(s): HGBA1C in the last 72 hours. CBG: No results for input(s): GLUCAP in the last 168 hours. Lipid Profile: No results for input(s): CHOL, HDL, LDLCALC, TRIG, CHOLHDL, LDLDIRECT in the last 72 hours. Thyroid Function Tests: No results for input(s): TSH, T4TOTAL, FREET4, T3FREE, THYROIDAB in the last 72 hours. Anemia Panel: No results for input(s): VITAMINB12, FOLATE, FERRITIN, TIBC, IRON, RETICCTPCT in the last 72 hours. Urine analysis: No results found for: COLORURINE, APPEARANCEUR, LABSPEC, PHURINE, GLUCOSEU, HGBUR, BILIRUBINUR, KETONESUR, PROTEINUR, UROBILINOGEN, NITRITE, LEUKOCYTESUR Sepsis Labs: @LABRCNTIP (procalcitonin:4,lacticidven:4) )No results found for this or  any previous visit (from the past 240 hour(s)).   Radiological Exams on Admission: Ct Chest W Contrast  Result Date: 04/03/2017 CLINICAL DATA:  Recurrent pneumonia for 3 months. EXAM: CT CHEST WITH CONTRAST TECHNIQUE: Multidetector CT imaging of the chest was performed during intravenous contrast administration. CONTRAST:  77mL ISOVUE-300 IOPAMIDOL (ISOVUE-300) INJECTION 61% COMPARISON:  CT abdomen dated 03/18/2017. Chest x-ray dated 03/03/2017. FINDINGS: Cardiovascular: Cardiomegaly.  No pericardial effusion. Mild aortic atherosclerosis.  No aortic aneurysm or  dissection. Mediastinum/Nodes: Small lymph nodes within the mediastinum, fairly numerous, largest within the subcarinal space with 1.1 cm short axis dimension. Esophagus is unremarkable. Trachea and central bronchi are unremarkable. Lungs/Pleura: New right pleural effusion, moderate to large in size, with associated compressive atelectasis at the right lung base. New small left pleural effusion. Rounded masslike consolidation within the left lower lobe, abutting the fissure, measuring 5.2 cm craniocaudal dimension, 3.3 cm transverse dimension and 2.6 cm AP dimension (series 2, image 85), pneumonia versus neoplastic mass versus rounded atelectasis. Upper Abdomen: No acute findings on limited views of the upper abdomen. Musculoskeletal: Extensive degenerative change at each shoulder, with extensive soft tissue thickening and fluid at each joint space, right greater than left, with associated subluxation of the right humeral head relative to the glenoid fossa, presumably chronic inflammatory arthritis. IMPRESSION: 1. Rounded masslike consolidation within the left lower lobe, measuring 5.2 x 3.3 x 2.6 cm. Differential considerations include pneumonia, neoplastic mass with central necrosis and rounded atelectasis with trapped fluid. At minimum, recommend follow-up CT to ensure resolution. Could also consider further characterization with PET-CT or tissue sampling. 2. New moderate to large right pleural effusion. New small left pleural effusion. 3. Small lymph nodes within the mediastinum, fairly numerous but none of which are pathologic by CT size criteria, likely reactive, neoplastic lymphadenopathy not excluded. 4. Cardiomegaly. 5. Marked degenerative change at each shoulder, with extensive soft tissue thickening within and surrounding each joint space and associated fluid in the joint space, right greater than left, incompletely imaged, presumed chronic inflammatory arthritis. Recommend correlation for any acute  sequela. Consider plain film correlation if any acute symptoms. Aortic Atherosclerosis (ICD10-I70.0). These results will be called to the ordering clinician or representative by the Radiologist Assistant, and communication documented in the PACS or zVision Dashboard. Electronically Signed   By: Franki Cabot M.D.   On: 04/03/2017 15:25    EKG: Independently reviewed. Sinus rhythm with incomplete right bundle branch block. Prolonged QT. No acute ST changes.  Assessment/Plan: Principal Problem:   Left lower lobe pneumonia (HCC) Active Problems:   IBS (irritable bowel syndrome)   GERD (gastroesophageal reflux disease)    This patient was discussed with the ED physician, including pertinent vitals, physical exam findings, labs, and imaging.  We also discussed care given by the ED provider.  #1 left lower lobe pneumonia  Admit  I reviewed the case with Dr Juliann Pulse from PCCM - ? Pulmonary abscess vs mass with central necrosis.   Recommended Zosyn for now, given pt afebrile and would still cover for abscess.   Pulmonology will consult.   Blood cultures  Sputum cultures  Strep antigen, legionella antigen #2 IBS  Currently controlled #3 GERD  Continue PPI  DVT prophylaxis: Lovenox Consultants: none Code Status: Partial code: DNI, but okay with compressions, medications, defibrillation Family Communication: son in the room during interview  Disposition Plan: admit to med surg   Truett Mainland, DO [facility] Pager [phone]  If 7PM-7AM, please contact night-coverage [email or web address] Password [email or web address]

## 2017-04-04 NOTE — Progress Notes (Signed)
[name] is a 83 y.o. female patient admitted from ED awake, alert - oriented  X 4 - no acute distress noted.  VSS - Blood pressure 108/70, pulse 91, temperature 97.9 F (36.6 C), temperature source Oral, resp. rate 13, height 4\' 11"  (1.499 m), weight 46.7 kg (103 lb), SpO2 95 %.    IV in place, occlusive dsg intact without redness.  Orientation to room, and floor completed with information packet given to patient/family.  Patient declined safety video at this time.  Admission INP armband ID verified with patient/family, and in place.   SR up x 2, fall assessment complete, with patient and family able to verbalize understanding of risk associated with falls, and verbalized understanding to call nsg before up out of bed.  Call light within reach, patient able to voice, and demonstrate understanding.  Skin, clean-dry- intact without evidence of bruising, or skin tears.   No evidence of skin break down noted on exam.     Will cont to eval and treat per MD orders.  Milas Hock, RN 04/04/2017 5:24 PM

## 2017-04-05 DIAGNOSIS — J181 Lobar pneumonia, unspecified organism: Secondary | ICD-10-CM

## 2017-04-05 DIAGNOSIS — R05 Cough: Secondary | ICD-10-CM

## 2017-04-05 DIAGNOSIS — R1310 Dysphagia, unspecified: Secondary | ICD-10-CM

## 2017-04-05 DIAGNOSIS — R634 Abnormal weight loss: Secondary | ICD-10-CM

## 2017-04-05 DIAGNOSIS — R059 Cough, unspecified: Secondary | ICD-10-CM

## 2017-04-05 LAB — CBC
HEMATOCRIT: 28.5 % — AB (ref 36.0–46.0)
Hemoglobin: 9.3 g/dL — ABNORMAL LOW (ref 12.0–15.0)
MCH: 32.3 pg (ref 26.0–34.0)
MCHC: 32.6 g/dL (ref 30.0–36.0)
MCV: 99 fL (ref 78.0–100.0)
PLATELETS: 466 10*3/uL — AB (ref 150–400)
RBC: 2.88 MIL/uL — ABNORMAL LOW (ref 3.87–5.11)
RDW: 14 % (ref 11.5–15.5)
WBC: 5.9 10*3/uL (ref 4.0–10.5)

## 2017-04-05 LAB — BASIC METABOLIC PANEL
Anion gap: 8 (ref 5–15)
BUN: 19 mg/dL (ref 6–20)
CHLORIDE: 100 mmol/L — AB (ref 101–111)
CO2: 27 mmol/L (ref 22–32)
CREATININE: 1.04 mg/dL — AB (ref 0.44–1.00)
Calcium: 9.5 mg/dL (ref 8.9–10.3)
GFR calc Af Amer: 55 mL/min — ABNORMAL LOW (ref >60)
GFR, EST NON AFRICAN AMERICAN: 48 mL/min — AB (ref >60)
GLUCOSE: 91 mg/dL (ref 65–99)
Potassium: 4.4 mmol/L (ref 3.5–5.1)
SODIUM: 135 mmol/L (ref 135–145)

## 2017-04-05 LAB — STREP PNEUMONIAE URINARY ANTIGEN: STREP PNEUMO URINARY ANTIGEN: NEGATIVE

## 2017-04-05 MED ORDER — ENOXAPARIN SODIUM 30 MG/0.3ML ~~LOC~~ SOLN
30.0000 mg | SUBCUTANEOUS | Status: DC
  Administered 2017-04-05 – 2017-04-06 (×2): 30 mg via SUBCUTANEOUS
  Filled 2017-04-05 (×2): qty 0.3

## 2017-04-05 MED ORDER — HYDROCODONE-ACETAMINOPHEN 7.5-325 MG PO TABS
1.0000 | ORAL_TABLET | Freq: Four times a day (QID) | ORAL | Status: DC | PRN
  Administered 2017-04-05 – 2017-04-07 (×8): 1 via ORAL
  Filled 2017-04-05 (×8): qty 1

## 2017-04-05 NOTE — Progress Notes (Signed)
SATURATION QUALIFICATIONS: (This note is used to comply with regulatory documentation for home oxygen)  Patient Saturations on Room Air at Rest = 99%  Patient Saturations on Room Air while Ambulating = 94%

## 2017-04-05 NOTE — Progress Notes (Addendum)
PROGRESS NOTE  [name]  YDX:[id number] DOB: 13-Jun-1930 DOA: 04/04/2017 PCP: Orpah Melter, MD  Brief Narrative:   The patient is an 83 year old female with history of IBS, arthritis, moderate to severe aspiration hospitalized in May with aspiration pneumonia who presents with shortness of breath, cough with mild hemoptysis that was not improved after 2 courses of antibiotics as an outpatient.  He had a modified barium swallow during her previous hospitalization that demonstrated moderate to severe aspiration however the patient did not want any feeding tubes. She was discharged on a dysphagia 3 diet with thin liquids. She was educated that she may have recurrent pneumonias and was told that she could request a palliative care consult from her primary care physician.  She denies fevers, chills, night sweats but she has had a 25 pound weight loss over the last year, unintentional. CT scan demonstrated a 5.2 x 3.3 x 2.6 cm rounded, masslike consolidation in the left lower lobe. Pulmonary critical care was consulted and she was started on antibiotics for healthcare associated pneumonia. She was afebrile and her white count was 7.2 on the date of admission.  Assessment & Plan:   Principal Problem:   Left lower lobe pneumonia ([place]) Active Problems:   Dysphagia   IBS (irritable bowel syndrome)   GERD (gastroesophageal reflux disease)   Consolidation lung (HCC) left lower lobe   Loss of weight  Acute respiratory failure with hypoxia, oxygen saturation 78% on room air due to possible recurrent aspiration pneumonia vs. Malignancy of the LLL. -  PCCM consultation -  Agree with transitioning to monotherapy zosyn -  Ambulatory pulse ox  Dysphagia, seen by SLP two months ago and patient does not want to restrict diet/have feeding tube -  SLP to remind patient about aspiration precautions -  Continue dysphagia 3 with thin liquids  Severe protein calorie malnutrition -  Dysphagia 3 diet -   Add pudding supplements -   Nutrition consultation  Normocytic anemia, chronic, near baseline of 8-10g/dl -  Iron studies, B12, folate -  TSH -  Occult stool -  Repeat hgb in AM  Arthritis with chronic pain -  Resume home hydrocodone -  Continue celebrex  Falls at home -  PT evaluation -  Fall precautions  DVT prophylaxis:  lovenox Code Status:  partial Family Communication:  Patient alone Disposition Plan:  Pending improvement in dyspnea    Consultants:   PCCM  Procedures:  none  Antimicrobials:  Anti-infectives    Start     Dose/Rate Route Frequency Ordered Stop   04/05/17 1630  piperacillin-tazobactam (ZOSYN) IVPB 3.375 g     3.375 g 12.5 mL/hr over 240 Minutes Intravenous Every 8 hours 04/04/17 1728     04/05/17 1600  vancomycin (VANCOCIN) 500 mg in sodium chloride 0.9 % 100 mL IVPB  Status:  Discontinued     500 mg 100 mL/hr over 60 Minutes Intravenous Every 24 hours 04/04/17 1526 04/04/17 1715   04/04/17 1530  ceFEPIme (MAXIPIME) 1 g in dextrose 5 % 50 mL IVPB     1 g 100 mL/hr over 30 Minutes Intravenous  Once 04/04/17 1515 04/04/17 1740   04/04/17 1530  vancomycin (VANCOCIN) IVPB 1000 mg/200 mL premix  Status:  Discontinued     1,000 mg 200 mL/hr over 60 Minutes Intravenous  Once 04/04/17 1523 04/04/17 1715       Subjective:  Persistent cough productive of thick green phlegm with streaks of blood.  Has less than a teaspoon  of blood during coughing spells.  Denies fevers, chills.  Has had 25-lb weight loss.    Objective: Vitals:   04/04/17 1630 04/04/17 1839 04/04/17 2155 04/05/17 0630  BP: 108/70 136/75 128/69 107/69  Pulse: 91 100 95 91  Resp: 13 16 18 18   Temp:   98.4 F (36.9 C) 98.6 F (37 C)  TempSrc:   Oral Oral  SpO2: 95% 96% 96% 98%  Weight:      Height:       No intake or output data in the 24 hours ending 04/05/17 1101 Filed Weights   04/04/17 1152  Weight: 46.7 kg (103 lb)    Examination:  General exam:  Cachectic adult  female, SCM retractions,   HEENT:  NCAT, MMM Respiratory system: diminished at the left base, no focal rales, rhonchi, or wheeze Cardiovascular system: Regular rate and rhythm, normal S1/S2. No murmurs, rubs, gallops or clicks.  Warm extremities Gastrointestinal system: Normal active bowel sounds, soft, nondistended, nontender. MSK:  Normal tone and bulk, no lower extremity edema Neuro:  Grossly moves all extremities    Data Reviewed: I have personally reviewed following labs and imaging studies  CBC:  Recent Labs Lab 04/04/17 1149 04/05/17 0652  WBC 7.2 5.9  HGB 8.9* 9.3*  HCT 27.2* 28.5*  MCV 99.6 99.0  PLT 522* 706*   Basic Metabolic Panel:  Recent Labs Lab 04/04/17 1149 04/05/17 0652  NA 135 135  K 4.1 4.4  CL 100* 100*  CO2 25 27  GLUCOSE 106* 91  BUN 17 19  CREATININE 1.02* 1.04*  CALCIUM 9.4 9.5   GFR: Estimated Creatinine Clearance: 27 mL/min (A) (by C-G formula based on SCr of 1.04 mg/dL (H)). Liver Function Tests: No results for input(s): AST, ALT, ALKPHOS, BILITOT, PROT, ALBUMIN in the last 168 hours. No results for input(s): LIPASE, AMYLASE in the last 168 hours. No results for input(s): AMMONIA in the last 168 hours. Coagulation Profile: No results for input(s): INR, PROTIME in the last 168 hours. Cardiac Enzymes: No results for input(s): CKTOTAL, CKMB, CKMBINDEX, TROPONINI in the last 168 hours. BNP (last 3 results) No results for input(s): PROBNP in the last 8760 hours. HbA1C: No results for input(s): HGBA1C in the last 72 hours. CBG: No results for input(s): GLUCAP in the last 168 hours. Lipid Profile: No results for input(s): CHOL, HDL, LDLCALC, TRIG, CHOLHDL, LDLDIRECT in the last 72 hours. Thyroid Function Tests: No results for input(s): TSH, T4TOTAL, FREET4, T3FREE, THYROIDAB in the last 72 hours. Anemia Panel: No results for input(s): VITAMINB12, FOLATE, FERRITIN, TIBC, IRON, RETICCTPCT in the last 72 hours. Urine analysis: No results  found for: COLORURINE, APPEARANCEUR, LABSPEC, PHURINE, GLUCOSEU, HGBUR, BILIRUBINUR, KETONESUR, PROTEINUR, UROBILINOGEN, NITRITE, LEUKOCYTESUR Sepsis Labs: @LABRCNTIP (procalcitonin:4,lacticidven:4)  )No results found for this or any previous visit (from the past 240 hour(s)).    Radiology Studies: Ct Chest W Contrast  Result Date: 04/03/2017 CLINICAL DATA:  Recurrent pneumonia for 3 months. EXAM: CT CHEST WITH CONTRAST TECHNIQUE: Multidetector CT imaging of the chest was performed during intravenous contrast administration. CONTRAST:  56mL ISOVUE-300 IOPAMIDOL (ISOVUE-300) INJECTION 61% COMPARISON:  CT abdomen dated 03/18/2017. Chest x-ray dated 03/03/2017. FINDINGS: Cardiovascular: Cardiomegaly.  No pericardial effusion. Mild aortic atherosclerosis.  No aortic aneurysm or dissection. Mediastinum/Nodes: Small lymph nodes within the mediastinum, fairly numerous, largest within the subcarinal space with 1.1 cm Essance Gatti axis dimension. Esophagus is unremarkable. Trachea and central bronchi are unremarkable. Lungs/Pleura: New right pleural effusion, moderate to large in size, with associated compressive atelectasis  at the right lung base. New small left pleural effusion. Rounded masslike consolidation within the left lower lobe, abutting the fissure, measuring 5.2 cm craniocaudal dimension, 3.3 cm transverse dimension and 2.6 cm AP dimension (series 2, image 85), pneumonia versus neoplastic mass versus rounded atelectasis. Upper Abdomen: No acute findings on limited views of the upper abdomen. Musculoskeletal: Extensive degenerative change at each shoulder, with extensive soft tissue thickening and fluid at each joint space, right greater than left, with associated subluxation of the right humeral head relative to the glenoid fossa, presumably chronic inflammatory arthritis. IMPRESSION: 1. Rounded masslike consolidation within the left lower lobe, measuring 5.2 x 3.3 x 2.6 cm. Differential considerations include  pneumonia, neoplastic mass with central necrosis and rounded atelectasis with trapped fluid. At minimum, recommend follow-up CT to ensure resolution. Could also consider further characterization with PET-CT or tissue sampling. 2. New moderate to large right pleural effusion. New small left pleural effusion. 3. Small lymph nodes within the mediastinum, fairly numerous but none of which are pathologic by CT size criteria, likely reactive, neoplastic lymphadenopathy not excluded. 4. Cardiomegaly. 5. Marked degenerative change at each shoulder, with extensive soft tissue thickening within and surrounding each joint space and associated fluid in the joint space, right greater than left, incompletely imaged, presumed chronic inflammatory arthritis. Recommend correlation for any acute sequela. Consider plain film correlation if any acute symptoms. Aortic Atherosclerosis (ICD10-I70.0). These results will be called to the ordering clinician or representative by the Radiologist Assistant, and communication documented in the PACS or zVision Dashboard. Electronically Signed   By: Franki Cabot M.D.   On: 04/03/2017 15:25     Scheduled Meds: . celecoxib  200 mg Oral BID  . DULoxetine  60 mg Oral QHS  . enoxaparin (LOVENOX) injection  40 mg Subcutaneous Q24H  . pantoprazole  40 mg Oral Daily  . polyethylene glycol  34 g Oral QHS  . triazolam  0.25 mg Oral QHS   Continuous Infusions: . piperacillin-tazobactam (ZOSYN)  IV       LOS: 1 day    Time spent: 30 min    Janece Canterbury, MD [facility] Pager [phone]  If 7PM-7AM, please contact night-coverage [email or web address] Password [email or web address] 04/05/2017, 11:01 AM

## 2017-04-05 NOTE — Consult Note (Signed)
Name: [name] MRN: [id number] DOB: 1930-01-23    ADMISSION DATE:  04/04/2017 CONSULTATION DATE:  7/1  REFERRING MD :  [facility]ad  CHIEF COMPLAINT: Mass lll  BRIEF PATIENT DESCRIPTION: Thin white female  SIGNIFICANT EVENTS    STUDIES:    HISTORY OF PRESENT ILLNESS:   83 yo never smoke with multiple medical problems with recent aspiration pneumonia , discharged on 5/8 2018. She has IBS and has lost weight over the last year. Swallow eval 5/5 with severe risk for aspiration with all consistencies and dysphagia 3 diet recommended. She had ct chest 6/29 which demonstrated 5.2x3.3x2.6 cm mass like consolidation LLL. PCCM asked to evaluate.  PAST MEDICAL HISTORY :   has a past medical history of Arthritis; IBS (irritable bowel syndrome); and Pneumonia.  has no past surgical history on file. Prior to Admission medications   Medication Sig Start Date End Date Taking? Authorizing Provider  celecoxib (CELEBREX) 200 MG capsule Take 200 mg by mouth 2 (two) times daily. 01/10/17  Yes [provider]  doxycycline (VIBRAMYCIN) 100 MG capsule Take 100 mg by mouth 2 (two) times daily.   Yes [provider]  DULoxetine (CYMBALTA) 60 MG capsule Take 60 mg by mouth at bedtime.  12/22/16  Yes [provider]  HYDROcodone-acetaminophen (NORCO) 7.5-325 MG tablet Take 1 tablet by mouth every 4 (four) hours as needed (pain).  01/23/17  Yes [provider]  hydrocortisone 2.5 % cream Apply 1 application topically 2 (two) times daily as needed (rash).  11/06/16  Yes [provider]  pantoprazole (PROTONIX) 40 MG tablet Take 40 mg by mouth daily. 01/19/17  Yes [provider]  polyethylene glycol (MIRALAX / GLYCOLAX) packet Take 34 g by mouth at bedtime. Mix 2 capfuls (34 grams) in 6-8 oz liquid and drink   Yes [provider]  rOPINIRole (REQUIP) 0.25 MG tablet Take 1 tablet (0.25 mg total) by mouth daily as needed (severe restless legs). 02/10/17   Yes Domenic Polite, MD  Tetrahydrozoline HCl (VISINE OP) Place 1 drop into both eyes daily as needed (dry eyes/irritation).   Yes [provider]  triazolam (HALCION) 0.25 MG tablet Take 0.25 mg by mouth at bedtime. 01/22/17  Yes [provider]  amoxicillin-clavulanate (AUGMENTIN) 875-125 MG tablet Take 1 tablet by mouth every 12 (twelve) hours. For 4 days Patient not taking: Reported on 04/04/2017 02/10/17   Domenic Polite, MD  benzonatate (TESSALON) 100 MG capsule Take 100 mg by mouth 3 (three) times daily as needed for cough.  02/03/17   [provider]   Allergies  Allergen Reactions  . Cerefolin [L-Methylfolate-B12-B6-B2] Nausea Only  . Codeine Nausea And Vomiting and Other (See Comments)    dizziness  . Lactose Intolerance (Gi) Other (See Comments)    Constipation and bloating    FAMILY HISTORY:  family history is not on file. SOCIAL HISTORY:  reports that she has never smoked. She has never used smokeless tobacco. She reports that she does not drink alcohol or use drugs.  REVIEW OF SYSTEMS:   10 point review of system taken, please see HPI for positives and negatives. .  SUBJECTIVE: NAD at rest  VITAL SIGNS: Temp:  [97.9 F (36.6 C)-98.6 F (37 C)] 98.6 F (37 C) (07/01 0630) Pulse Rate:  [90-108] 91 (07/01 0630) Resp:  [10-23] 18 (07/01 0630) BP: (106-136)/(65-87) 107/69 (07/01 0630) SpO2:  [78 %-100 %] 98 % (07/01 0630) Weight:  [103 lb (46.7 kg)] 103 lb (46.7 kg) (06/30  1152)  PHYSICAL EXAMINATION: General:  Thin frail female Neuro:  Intact, very sharp and inquisitive  HEENT:  No JVD/LAN Cardiovascular:  HSD  Lungs:  Coarse rhonchi bilaterally  Abdomen:  Distended but not tender Musculoskeletal:  intact Skin:  WDI   Recent Labs Lab 04/04/17 1149 04/05/17 0652  NA 135 135  K 4.1 4.4  CL 100* 100*  CO2 25 27  BUN 17 19  CREATININE 1.02* 1.04*  GLUCOSE 106* 91    Recent Labs Lab 04/04/17 1149 04/05/17 0652  HGB 8.9* 9.3*   HCT 27.2* 28.5*  WBC 7.2 5.9  PLT 522* 466*   Ct Chest W Contrast  Result Date: 04/03/2017 CLINICAL DATA:  Recurrent pneumonia for 3 months. EXAM: CT CHEST WITH CONTRAST TECHNIQUE: Multidetector CT imaging of the chest was performed during intravenous contrast administration. CONTRAST:  61mL ISOVUE-300 IOPAMIDOL (ISOVUE-300) INJECTION 61% COMPARISON:  CT abdomen dated 03/18/2017. Chest x-ray dated 03/03/2017. FINDINGS: Cardiovascular: Cardiomegaly.  No pericardial effusion. Mild aortic atherosclerosis.  No aortic aneurysm or dissection. Mediastinum/Nodes: Small lymph nodes within the mediastinum, fairly numerous, largest within the subcarinal space with 1.1 cm short axis dimension. Esophagus is unremarkable. Trachea and central bronchi are unremarkable. Lungs/Pleura: New right pleural effusion, moderate to large in size, with associated compressive atelectasis at the right lung base. New small left pleural effusion. Rounded masslike consolidation within the left lower lobe, abutting the fissure, measuring 5.2 cm craniocaudal dimension, 3.3 cm transverse dimension and 2.6 cm AP dimension (series 2, image 85), pneumonia versus neoplastic mass versus rounded atelectasis. Upper Abdomen: No acute findings on limited views of the upper abdomen. Musculoskeletal: Extensive degenerative change at each shoulder, with extensive soft tissue thickening and fluid at each joint space, right greater than left, with associated subluxation of the right humeral head relative to the glenoid fossa, presumably chronic inflammatory arthritis. IMPRESSION: 1. Rounded masslike consolidation within the left lower lobe, measuring 5.2 x 3.3 x 2.6 cm. Differential considerations include pneumonia, neoplastic mass with central necrosis and rounded atelectasis with trapped fluid. At minimum, recommend follow-up CT to ensure resolution. Could also consider further characterization with PET-CT or tissue sampling. 2. New moderate to large  right pleural effusion. New small left pleural effusion. 3. Small lymph nodes within the mediastinum, fairly numerous but none of which are pathologic by CT size criteria, likely reactive, neoplastic lymphadenopathy not excluded. 4. Cardiomegaly. 5. Marked degenerative change at each shoulder, with extensive soft tissue thickening within and surrounding each joint space and associated fluid in the joint space, right greater than left, incompletely imaged, presumed chronic inflammatory arthritis. Recommend correlation for any acute sequela. Consider plain film correlation if any acute symptoms. Aortic Atherosclerosis (ICD10-I70.0). These results will be called to the ordering clinician or representative by the Radiologist Assistant, and communication documented in the PACS or zVision Dashboard. Electronically Signed   By: Franki Cabot M.D.   On: 04/03/2017 15:25    ASSESSMENT :    Left lower lobe pneumonia (HCC)   Dysphagia   IBS (irritable bowel syndrome)   GERD (gastroesophageal reflux disease)   Consolidation lung (HCC) left lower lobe, possible mass.   Loss of weight   Discussion: 83 yo never smoke with multiple medical problems with recent aspiration pneumonia , discharged on 5/8 2018. She has IBS and has lost weight over the last year. Swallow eval 5/5 with severe risk for aspiration with all consistencies and dysphagia 3 diet recommended. She had ct chest 6/29 which demonstrated 5.2x3.3x2.6 cm mass like consolidation  LLL. PCCM asked to evaluate.  PLAN: Questionable FOB if risk benefits ratio is favorable Questionable thoracentesis for diagnosis NPO ? Feeding tube Abx  Richardson Landry Minor ACNP Maryanna Shape PCCM Pager [phone] till 3 pm If no answer page [phone] 04/05/2017, 10:43 AM  STAFF NOTE: I, Merrie Roof, MD FACP have personally reviewed patient's available data, including medical history, events of note, physical examination and test results as part of my evaluation. I have  discussed with resident/NP and other care providers such as pharmacist, RN and RRT. In addition, I personally evaluated patient and elicited key findings of: awake in chair, cachectic, lungs wheezing mild left lateral , reduced BS rt base, no edema, abdo soft, CT I reviewed reveled lateral aspect mass with small linears changes within and rt base effusion, I reviewed past pcxr which did not reveal these findings, she has had wt loss 25 pounds but also has been having IBS and aspiration and poor intake, she is not having SOB, diff dx of mass - cancer (some thickening rectum also) vs infectious ( infiltrate vs abscess) (aspiration related) vs IBS autoimmune ( unlikely), if she needs BX best way is CT guidance however if we Bx this and infection may leak and have BP fistula, therefore will assess coags in am and [place] chest and likley thora by PCCM in am then eval for cytology and fluid analysis then decide on BX ct of mass lesion, I updated family in full son and pt  Lavon Paganini. Titus Mould, MD, B and E Pgr: [facility] 04/05/2017 11:19 AM

## 2017-04-05 NOTE — Evaluation (Signed)
Clinical/Bedside Swallow Evaluation Patient Details  Name: [name] MRN: [id number] Date of Birth: 10-08-1929  Today's Date: 04/05/2017 Time: SLP Start Time (ACUTE ONLY): 4268 SLP Stop Time (ACUTE ONLY): 1200 SLP Time Calculation (min) (ACUTE ONLY): 26 min  Past Medical History:  Past Medical History:  Diagnosis Date  . Arthritis   . IBS (irritable bowel syndrome)   . Pneumonia    Past Surgical History: History reviewed. No pertinent surgical history. HPI:  Pt is an 83 yo female with a recent admission in May 2018 for PNA that was felt to be aspiration related. She presents again with worsening cough and blood-streaked sputum. She has lost 25 pounds over the past year. CT chest 6/29 which demonstrated 5.2x3.3x2.6 cm mass like consolidation LLL. She had an esophagram 02/03/17 that revealed esophageal dysmotility with poor initiation of the primary stripping wave. MBS on 02/08/17 indicated a severe pharyngoesophageal dysphagia with reduced clearance into the UES resulting in aspiration or deep penetration of all liquid consistencies as well as significant pharyngeal residuals. At the time, did not want any feeding tubes or modified diets, and was d/c on a mechanical soft diet with thin liquids accepting the risks of aspiration.    Assessment / Plan / Recommendation Clinical Impression  Pt's swallowing appears grossly consistent with MBS on 02/08/17, with signs of dysphagia and likely aspiration that include multiple swallows, c/o pharyngeal residue, immediate coughing with thin liquids, and delayed coughing with purees. Pt and her son verbalize their understanding of her chronic aspiration risk, and she again reiterates that she does not want a feeding tube. They also acknowledge that diet modifications would not eliminate her aspiration risk as well. We discussed the possibility of repeat testing to see if she has made any improvements given increased overall strength since last MBS, but they  politely decline repeat testing because they do not believe her swallowing has changed much and they also believe that they understand her risks well enough. Pt verbalized aspiration and esophageal precautions with Mod I.   Given pt's wishes, would continue with Dys 3 diet and thin liquids with use of precautions and emphasis on frequent, thorough oral care. I also shared with them that a discussion with palliative care may be beneficial given pt's pharyngoesophageal dysphagia that likely has chronic implications. SLP to sign off at this time - please re-order if we can be of further assistance.  SLP Visit Diagnosis: Dysphagia, pharyngoesophageal phase (R13.14)    Aspiration Risk  Severe aspiration risk;Risk for inadequate nutrition/hydration    Diet Recommendation Dysphagia 3 (Mech soft);Thin liquid   Liquid Administration via: Cup;Straw Medication Administration: Crushed with puree Supervision: Patient able to self feed;Intermittent supervision to cue for compensatory strategies Compensations: Slow rate;Small sips/bites;Multiple dry swallows after each bite/sip;Follow solids with liquid;Hard cough after swallow Postural Changes: Seated upright at 90 degrees;Remain upright for at least 30 minutes after po intake    Other  Recommendations Oral Care Recommendations: Oral care QID   Follow up Recommendations 24 hour supervision/assistance      Frequency and Duration            Prognosis Prognosis for Safe Diet Advancement: Fair Barriers to Reach Goals: Severity of deficits      Swallow Study   General HPI: Pt is an 83 yo female with a recent admission in May 2018 for PNA that was felt to be aspiration related. She presents again with worsening cough and blood-streaked sputum. She has lost 25 pounds over the past year.  CT chest 6/29 which demonstrated 5.2x3.3x2.6 cm mass like consolidation LLL. She had an esophagram 02/03/17 that revealed esophageal dysmotility with poor initiation of the  primary stripping wave. MBS on 02/08/17 indicated a severe pharyngoesophageal dysphagia with reduced clearance into the UES resulting in aspiration or deep penetration of all liquid consistencies as well as significant pharyngeal residuals. At the time, did not want any feeding tubes or modified diets, and was d/c on a mechanical soft diet with thin liquids accepting the risks of aspiration.  Type of Study: Bedside Swallow Evaluation Previous Swallow Assessment: see HPI Diet Prior to this Study: Dysphagia 3 (soft);Thin liquids Temperature Spikes Noted: No Respiratory Status: Room air History of Recent Intubation: No Behavior/Cognition: Alert;Cooperative;Pleasant mood Oral Cavity Assessment: Within Functional Limits Oral Care Completed by SLP: No Oral Cavity - Dentition: Adequate natural dentition Vision: Functional for self-feeding Self-Feeding Abilities: Able to feed self Patient Positioning: Upright in chair Baseline Vocal Quality: Hoarse Volitional Cough: Strong    Oral/Motor/Sensory Function Overall Oral Motor/Sensory Function: Within functional limits   Ice Chips Ice chips: Not tested   Thin Liquid Thin Liquid: Impaired Presentation: Self Fed;Straw Pharyngeal  Phase Impairments: Multiple swallows;Cough - Immediate    Nectar Thick Nectar Thick Liquid: Not tested   Honey Thick Honey Thick Liquid: Not tested   Puree Puree: Impaired Presentation: Self Fed;Spoon Pharyngeal Phase Impairments: Multiple swallows;Cough - Delayed   Solid   GO   Solid: Not tested        Germain Osgood 04/05/2017,1:16 PM  Germain Osgood, M.A. CCC-SLP [phone]

## 2017-04-06 DIAGNOSIS — J90 Pleural effusion, not elsewhere classified: Secondary | ICD-10-CM

## 2017-04-06 LAB — VITAMIN B12: Vitamin B-12: 217 pg/mL (ref 180–914)

## 2017-04-06 LAB — CBC
HCT: 27 % — ABNORMAL LOW (ref 36.0–46.0)
HEMOGLOBIN: 8.9 g/dL — AB (ref 12.0–15.0)
MCH: 33.1 pg (ref 26.0–34.0)
MCHC: 33 g/dL (ref 30.0–36.0)
MCV: 100.4 fL — ABNORMAL HIGH (ref 78.0–100.0)
Platelets: 458 10*3/uL — ABNORMAL HIGH (ref 150–400)
RBC: 2.69 MIL/uL — ABNORMAL LOW (ref 3.87–5.11)
RDW: 14.3 % (ref 11.5–15.5)
WBC: 4.8 10*3/uL (ref 4.0–10.5)

## 2017-04-06 LAB — FERRITIN: Ferritin: 137 ng/mL (ref 11–307)

## 2017-04-06 LAB — APTT: APTT: 45 s — AB (ref 24–36)

## 2017-04-06 LAB — IRON AND TIBC
IRON: 34 ug/dL (ref 28–170)
Saturation Ratios: 15 % (ref 10.4–31.8)
TIBC: 234 ug/dL — ABNORMAL LOW (ref 250–450)
UIBC: 200 ug/dL

## 2017-04-06 LAB — BASIC METABOLIC PANEL
ANION GAP: 8 (ref 5–15)
BUN: 24 mg/dL — ABNORMAL HIGH (ref 6–20)
CALCIUM: 9.5 mg/dL (ref 8.9–10.3)
CO2: 27 mmol/L (ref 22–32)
Chloride: 102 mmol/L (ref 101–111)
Creatinine, Ser: 1.05 mg/dL — ABNORMAL HIGH (ref 0.44–1.00)
GFR calc Af Amer: 55 mL/min — ABNORMAL LOW (ref >60)
GFR, EST NON AFRICAN AMERICAN: 47 mL/min — AB (ref >60)
GLUCOSE: 92 mg/dL (ref 65–99)
POTASSIUM: 4.1 mmol/L (ref 3.5–5.1)
SODIUM: 137 mmol/L (ref 135–145)

## 2017-04-06 LAB — OCCULT BLOOD X 1 CARD TO LAB, STOOL: FECAL OCCULT BLD: NEGATIVE

## 2017-04-06 LAB — FOLATE: FOLATE: 12.8 ng/mL (ref >5.9)

## 2017-04-06 LAB — TSH: TSH: 4.931 u[IU]/mL — ABNORMAL HIGH (ref 0.350–4.500)

## 2017-04-06 LAB — PROTIME-INR
INR: 1.17
PROTHROMBIN TIME: 14.9 s (ref 11.4–15.2)

## 2017-04-06 LAB — LEGIONELLA PNEUMOPHILA SEROGP 1 UR AG: L. PNEUMOPHILA SEROGP 1 UR AG: NEGATIVE

## 2017-04-06 MED ORDER — BOOST / RESOURCE BREEZE PO LIQD
1.0000 | Freq: Three times a day (TID) | ORAL | Status: DC
  Administered 2017-04-06 – 2017-04-07 (×4): 1 via ORAL

## 2017-04-06 MED ORDER — SENNA 8.6 MG PO TABS
2.0000 | ORAL_TABLET | Freq: Every day | ORAL | Status: DC
  Administered 2017-04-06: 17.2 mg via ORAL
  Filled 2017-04-06: qty 2

## 2017-04-06 MED ORDER — POLYETHYLENE GLYCOL 3350 17 G PO PACK
17.0000 g | PACK | Freq: Three times a day (TID) | ORAL | Status: DC
  Administered 2017-04-06 – 2017-04-07 (×5): 17 g via ORAL
  Filled 2017-04-06 (×5): qty 1

## 2017-04-06 NOTE — Evaluation (Signed)
Physical Therapy Evaluation Patient Details Name: [name] MRN: [id number] DOB: 1930-06-30 Today's Date: 04/06/2017   History of Present Illness  Patient is a 83 y/o female who presents with cough. Recently admitted 5/4-5/8 for PNA. Chest CT-Bilateral R > L effusion. Concern for aspiration PNA vs malignancy. PMH includes IBS, bil TKR, right shoulder surgery, back surgery.  Clinical Impression  Patient presents with generalized weakness, pain in joints, imbalance, dyspnea on exertion and impaired mobility s/p above. Tolerated gait training with Min guard-supervision for safety. 2/4 DOE. Pt has needed assist with ADLs since last admission in May and reports some falls. Has a caregiver assist with all care. Encouraged family to walk with pt a few times per day to maintain strength. Will follow acutely to maximize independence and mobility prior to return home.     Follow Up Recommendations No PT follow up;Supervision for mobility/OOB    Equipment Recommendations  None recommended by PT    Recommendations for Other Services       Precautions / Restrictions Precautions Precautions: Fall Restrictions Weight Bearing Restrictions: No      Mobility  Bed Mobility               General bed mobility comments: Sitting in chair upon PT arrival.   Transfers Overall transfer level: Needs assistance Equipment used: Rolling walker (2 wheeled) Transfers: Sit to/from Stand Sit to Stand: Supervision         General transfer comment: Supervision for safety. Stood from [employer], from toilet x1.   Ambulation/Gait Ambulation/Gait assistance: Min guard Ambulation Distance (Feet): 150 Feet Assistive device: Rolling walker (2 wheeled) Gait Pattern/deviations: Step-through pattern;Decreased stride length;Trunk flexed Gait velocity: decreased Gait velocity interpretation: <1.8 ft/sec, indicative of risk for recurrent falls General Gait Details: Slow, mostly steady gait with RW. 2/4  DOE. VSS.   Stairs            Wheelchair Mobility    Modified Rankin (Stroke Patients Only)       Balance Overall balance assessment: Needs assistance Sitting-balance support: Feet supported;No upper extremity supported Sitting balance-Leahy Scale: Good     Standing balance support: During functional activity Standing balance-Leahy Scale: Fair Standing balance comment: Able to wash hands at sink and reach outside BoS without difficulty.                              Pertinent Vitals/Pain Pain Assessment: Faces Faces Pain Scale: Hurts little more Pain Location: arthritis- hands, elbows Pain Descriptors / Indicators: Sore Pain Intervention(s): Monitored during session;Repositioned    Home Living Family/patient expects to be discharged to:: Private residence Living Arrangements: Children Available Help at Discharge: Family;Available PRN/intermittently;Personal care attendant Type of Home: House Home Access: Stairs to enter Entrance Stairs-Rails: Left;Right;Can reach both Entrance Stairs-Number of Steps: 3 Home Layout: Two level;Able to live on main level with bedroom/bathroom Home Equipment: Walker - 4 wheels;Shower seat;Bedside commode      Prior Function Level of Independence: Needs assistance   Gait / Transfers Assistance Needed: Uses rollator or household ambulatory after admission in May. Reports falls.  ADL's / Homemaking Assistance Needed: Needs assist with ADLs- has a caregiver come in 9-2 daily to assist with bathing, dressing, IADLs. Family helps with meals.        Hand Dominance        Extremity/Trunk Assessment   Upper Extremity Assessment Upper Extremity Assessment: Defer to OT evaluation (arthritic changes hands)  Lower Extremity Assessment Lower Extremity Assessment: Generalized weakness    Cervical / Trunk Assessment Cervical / Trunk Assessment: Kyphotic  Communication   Communication: HOH  Cognition Arousal/Alertness:  Awake/alert Behavior During Therapy: WFL for tasks assessed/performed Overall Cognitive Status: Within Functional Limits for tasks assessed                                 General Comments: for basic tasks.      General Comments General comments (skin integrity, edema, etc.): Son present during session.    Exercises     Assessment/Plan    PT Assessment Patient needs continued PT services  PT Problem List Decreased strength;Decreased mobility;Pain;Decreased balance;Cardiopulmonary status limiting activity       PT Treatment Interventions Therapeutic activities;Gait training;Therapeutic exercise;Patient/family education;Balance training;Stair training;Functional mobility training    PT Goals (Current goals can be found in the Care Plan section)  Acute Rehab PT Goals Patient Stated Goal: to go home to my own bed PT Goal Formulation: With patient Time For Goal Achievement: 04/20/17 Potential to Achieve Goals: Fair    Frequency Min 3X/week   Barriers to discharge Decreased caregiver support      Co-evaluation               AM-PAC PT "6 Clicks" Daily Activity  Outcome Measure Difficulty turning over in bed (including adjusting bedclothes, sheets and blankets)?: None Difficulty moving from lying on back to sitting on the side of the bed? : None Difficulty sitting down on and standing up from a chair with arms (e.g., wheelchair, bedside commode, etc,.)?: None Help needed moving to and from a bed to chair (including a wheelchair)?: None Help needed walking in hospital room?: A Little Help needed climbing 3-5 steps with a railing? : A Little 6 Click Score: 22    End of Session Equipment Utilized During Treatment: Gait belt Activity Tolerance: Patient tolerated treatment well Patient left: in chair;with call bell/phone within reach;with family/visitor present;with chair alarm set Nurse Communication: Mobility status PT Visit Diagnosis: Unsteadiness on  feet (R26.81);Muscle weakness (generalized) (M62.81)    Time: 1103-1594 PT Time Calculation (min) (ACUTE ONLY): 24 min   Charges:   PT Evaluation $PT Eval Low Complexity: 1 Procedure PT Treatments $Gait Training: 8-22 mins   PT G Codes:        Wray Kearns, PT, DPT [phone]    Marguarite Arbour A [place]uri Fleece 04/06/2017, 1:51 PM

## 2017-04-06 NOTE — Progress Notes (Signed)
   Name: [name] MRN: [id number] DOB: 1930-04-30    ADMISSION DATE:  04/04/2017 CONSULTATION DATE:  7/1  REFERRING MD :  Triad  CHIEF COMPLAINT: Mass lll  BRIEF PATIENT DESCRIPTION:  83 yo never smoke with multiple medical problems with recent aspiration pneumonia , discharged on 02/10/2017. She has IBS and has lost weight over the last year. Swallow eval 5/5 with severe risk for aspiration with all consistencies and dysphagia 3 diet recommended.  CT chest 6/29 which demonstrated 5.2x3.3x2.6 cm mass like consolidation LLL. PCCM asked to evaluate.  SIGNIFICANT EVENTS  6/30 > Presents with new hemoptysis   STUDIES:  CT Chest 6/29 > Rounded masslike consolidation within the left lower lobe, measuring 5.2 x 3.3 x 2.6 cm. New moderate to large right pleural effusion. New small left pleural effusion. Small lymph nodes within the mediastinum, fairly numerous but none of which are pathologic by CT size criteria, likely reactive, neoplastic lymphadenopathy not excluded. Cardiomegaly.Marked degenerative change at each shoulder, with extensive soft tissue thickening within and surrounding each joint space and associated fluid in the joint space, right greater than left, incompletely imaged, presumed chronic inflammatory arthritis. Recommend correlation for any acute sequela. Consider plain film correlation if any acute symptoms.  SUBJECTIVE:  Sitting in chair, denies pain   VITAL SIGNS: Temp:  [98 F (36.7 C)] 98 F (36.7 C) (07/02 0538) Pulse Rate:  [94-98] 96 (07/02 0538) Resp:  [16-20] 16 (07/02 0538) BP: (112-127)/(63-69) 120/64 (07/02 0538) SpO2:  [96 %-97 %] 96 % (07/02 0538)  PHYSICAL EXAMINATION: General:  Thin frail elderly female Neuro:  Alert, oriented, grossly intact  HEENT: Normocephalic  Cardiovascular: RRR, no MRG  Lungs: non-labored, bibasilar crackles  Abdomen: non-tender, active bowel sounds  Musculoskeletal: -edema  Skin:  Warm, dry, intact    Recent  Labs Lab 04/04/17 1149 04/05/17 0652 04/06/17 0605  NA 135 135 137  K 4.1 4.4 4.1  CL 100* 100* 102  CO2 25 27 27   BUN 17 19 24*  CREATININE 1.02* 1.04* 1.05*  GLUCOSE 106* 91 92    Recent Labs Lab 04/04/17 1149 04/05/17 0652 04/06/17 0605  HGB 8.9* 9.3* 8.9*  HCT 27.2* 28.5* 27.0*  WBC 7.2 5.9 4.8  PLT 522* 466* 458*   No results found.   Consolidation lung ([place]) left lower lobe, pulmonary abscess vs malignancy  Hemoptysis  H/O Aspiration PNA  PLAN: -Maintain Oxygenation > 92 (currently on Room Air)  -Bedside U/S with little window for tap -Will place order for IR > follow up on results    Hayden Pedro, AGACNP-BC [place]  Pgr: [phone]  PCCM Pgr: [phone]

## 2017-04-06 NOTE — Progress Notes (Signed)
PROGRESS NOTE  TOBIE PERDUE  WUJ:[id number] DOB: 04-05-1930 DOA: 04/04/2017 PCP: Orpah Melter, MD  Brief Narrative:   The patient is an 83 year old female with history of IBS, arthritis, moderate to severe aspiration hospitalized in May with aspiration pneumonia who presents with shortness of breath, cough with mild hemoptysis that was not improved after 2 courses of antibiotics as an outpatient.  He had a modified barium swallow during her previous hospitalization that demonstrated moderate to severe aspiration however the patient did not want any feeding tubes. She was discharged on a dysphagia 3 diet with thin liquids. She was educated that she may have recurrent pneumonias and was told that she could request a palliative care consult from her primary care physician.  She denies fevers, chills, night sweats but she has had a 25 pound weight loss over the last year, unintentional. CT scan demonstrated a 5.2 x 3.3 x 2.6 cm rounded, masslike consolidation in the left lower lobe. Pulmonary critical care was consulted and she was started on antibiotics for healthcare associated pneumonia. She was afebrile and her white count was 7.2 on the date of admission.  Assessment & Plan:   Principal Problem:   Left lower lobe pneumonia ([place]) Active Problems:   Dysphagia   IBS (irritable bowel syndrome)   GERD (gastroesophageal reflux disease)   Consolidation lung (HCC) left lower lobe   Loss of weight   Cough   Pleural effusion  Acute respiratory failure with hypoxia, oxygen saturation 78% on room air due to possible recurrent aspiration pneumonia vs. Malignancy of the LLL. -  PCCM consultation appreciated -  Continue zosyn -  Ambulatory pulse ox:  Stable on room air -  Thoracentesis, IR consult placed by PCCM -  Will need prolonged course of antibiotics -  F/u sputum sample  Dysphagia, seen by SLP two months ago and patient does not want to restrict diet/have feeding tube -  SLP to remind  patient about aspiration precautions -  Continue dysphagia 3 with thin liquids  Severe protein calorie malnutrition -  Dysphagia 3 diet -  Add boost breeze -   Nutrition consultation  Normocytic anemia, chronic, near baseline of 8-10g/dl, likely due to chronic disease -  Iron studies normal, B12 low normal, folate normal -  Start vitamin b12 supplementation -  TSH mildly elevated 4.93 in setting of acute illness -  Occult stool negative  Arthritis with chronic pain -  Resume home hydrocodone -  Continue celebrex  Falls at home -  PT evaluation:  No follow up -  Fall precautions  DVT prophylaxis:  lovenox Code Status:  partial Family Communication:  Patient alone Disposition Plan:  Awaiting thoractentesis   Consultants:   PCCM  Procedures:  none  Antimicrobials:  Anti-infectives    Start     Dose/Rate Route Frequency Ordered Stop   04/05/17 1630  piperacillin-tazobactam (ZOSYN) IVPB 3.375 g     3.375 g 12.5 mL/hr over 240 Minutes Intravenous Every 8 hours 04/04/17 1728     04/05/17 1600  vancomycin (VANCOCIN) 500 mg in sodium chloride 0.9 % 100 mL IVPB  Status:  Discontinued     500 mg 100 mL/hr over 60 Minutes Intravenous Every 24 hours 04/04/17 1526 04/04/17 1715   04/04/17 1530  ceFEPIme (MAXIPIME) 1 g in dextrose 5 % 50 mL IVPB     1 g 100 mL/hr over 30 Minutes Intravenous  Once 04/04/17 1515 04/04/17 1740   04/04/17 1530  vancomycin (VANCOCIN) IVPB 1000 mg/200  mL premix  Status:  Discontinued     1,000 mg 200 mL/hr over 60 Minutes Intravenous  Once 04/04/17 1523 04/04/17 1715       Subjective:  Cough is worsening, coughing up copious yellow-green phlegm mixed with blood.  Sputum sample was sent.  SOB when ambulating, but was able to work with RN yesterday and walk in hall.    Objective: Vitals:   04/05/17 0630 04/05/17 1617 04/05/17 2156 04/06/17 0538  BP: 107/69 112/63 127/69 120/64  Pulse: 91 98 94 96  Resp: 18 20 16 16   Temp: 98.6 F (37 C) 98  F (36.7 C)  98 F (36.7 C)  TempSrc: Oral Oral  Oral  SpO2: 98% 97% 97% 96%  Weight:      Height:        Intake/Output Summary (Last 24 hours) at 04/06/17 1432 Last data filed at 04/06/17 0759  Gross per 24 hour  Intake              100 ml  Output             1050 ml  Net             -950 ml   Filed Weights   04/04/17 1152  Weight: 46.7 kg (103 lb)    Examination:  General exam:  Cachectic adult female, no acute distress HEENT: Cephalic, atraumatic, moist. His membranes Respiratory system: Coarse rales at the left base, no rhonchi or wheeze Cardiovascular system: Regular rate and rhythm, no murmurs rubs or gallops, warm extremities  Gastrointestinal system: NABS, soft, nondistended, nontender MSK:  Normal tone and bulk, no lower extremity edema Neuro: Grossly moves all extremities    Data Reviewed: I have personally reviewed following labs and imaging studies  CBC:  Recent Labs Lab 04/04/17 1149 04/05/17 0652 04/06/17 0605  WBC 7.2 5.9 4.8  HGB 8.9* 9.3* 8.9*  HCT 27.2* 28.5* 27.0*  MCV 99.6 99.0 100.4*  PLT 522* 466* 811*   Basic Metabolic Panel:  Recent Labs Lab 04/04/17 1149 04/05/17 0652 04/06/17 0605  NA 135 135 137  K 4.1 4.4 4.1  CL 100* 100* 102  CO2 25 27 27   GLUCOSE 106* 91 92  BUN 17 19 24*  CREATININE 1.02* 1.04* 1.05*  CALCIUM 9.4 9.5 9.5   GFR: Estimated Creatinine Clearance: 26.7 mL/min (A) (by C-G formula based on SCr of 1.05 mg/dL (H)). Liver Function Tests: No results for input(s): AST, ALT, ALKPHOS, BILITOT, PROT, ALBUMIN in the last 168 hours. No results for input(s): LIPASE, AMYLASE in the last 168 hours. No results for input(s): AMMONIA in the last 168 hours. Coagulation Profile:  Recent Labs Lab 04/06/17 0605  INR 1.17   Cardiac Enzymes: No results for input(s): CKTOTAL, CKMB, CKMBINDEX, TROPONINI in the last 168 hours. BNP (last 3 results) No results for input(s): PROBNP in the last 8760 hours. HbA1C: No  results for input(s): HGBA1C in the last 72 hours. CBG: No results for input(s): GLUCAP in the last 168 hours. Lipid Profile: No results for input(s): CHOL, HDL, LDLCALC, TRIG, CHOLHDL, LDLDIRECT in the last 72 hours. Thyroid Function Tests:  Recent Labs  04/06/17 0605  TSH 4.931*   Anemia Panel:  Recent Labs  04/06/17 0605  VITAMINB12 217  FOLATE 12.8  FERRITIN 137  TIBC 234*  IRON 34   Urine analysis: No results found for: COLORURINE, APPEARANCEUR, LABSPEC, PHURINE, GLUCOSEU, HGBUR, BILIRUBINUR, KETONESUR, PROTEINUR, UROBILINOGEN, NITRITE, LEUKOCYTESUR Sepsis Labs: @LABRCNTIP (procalcitonin:4,lacticidven:4)  ) Recent Results (from  the past 240 hour(s))  Blood culture (routine x 2)     Status: None (Preliminary result)   Collection Time: 04/04/17  3:30 PM  Result Value Ref Range Status   Specimen Description BLOOD RIGHT ARM  Final   Special Requests   Final    BOTTLES DRAWN AEROBIC AND ANAEROBIC Blood Culture adequate volume   Culture NO GROWTH 1 DAY  Final   Report Status PENDING  Incomplete  Blood culture (routine x 2)     Status: None (Preliminary result)   Collection Time: 04/04/17  3:40 PM  Result Value Ref Range Status   Specimen Description BLOOD RIGHT HAND  Final   Special Requests IN PEDIATRIC BOTTLE Blood Culture adequate volume  Final   Culture NO GROWTH 1 DAY  Final   Report Status PENDING  Incomplete  Culture, sputum-assessment     Status: None (Preliminary result)   Collection Time: 04/06/17  7:04 AM  Result Value Ref Range Status   Specimen Description SPUTUM  Final   Special Requests NONE  Final   Sputum evaluation THIS SPECIMEN IS ACCEPTABLE FOR SPUTUM CULTURE  Final   Report Status PENDING  Incomplete  Culture, respiratory (NON-Expectorated)     Status: None (Preliminary result)   Collection Time: 04/06/17  7:04 AM  Result Value Ref Range Status   Specimen Description SPUTUM  Final   Special Requests NONE Reflexed from [id number]  Final   Gram  Stain   Final    ABUNDANT WBC PRESENT, PREDOMINANTLY PMN ABUNDANT GRAM POSITIVE COCCI IN CHAINS RARE GRAM VARIABLE ROD    Culture PENDING  Incomplete   Report Status PENDING  Incomplete      Radiology Studies: No results found.   Scheduled Meds: . celecoxib  200 mg Oral BID  . DULoxetine  60 mg Oral QHS  . enoxaparin (LOVENOX) injection  30 mg Subcutaneous Q24H  . feeding supplement  1 Container Oral TID BM  . pantoprazole  40 mg Oral Daily  . polyethylene glycol  17 g Oral TID  . senna  2 tablet Oral QHS  . triazolam  0.25 mg Oral QHS   Continuous Infusions: . piperacillin-tazobactam (ZOSYN)  IV Stopped (04/06/17 2202)     LOS: 2 days    Time spent: 30 min    Janece Canterbury, MD [facility] Pager [phone]  If 7PM-7AM, please contact night-coverage [email or web address] Password [facility] 04/06/2017, 2:32 PM

## 2017-04-06 NOTE — Progress Notes (Signed)
Initial Nutrition Assessment  DOCUMENTATION CODES:   Severe malnutrition in context of chronic illness  INTERVENTION:   -Continue Boost Breeze po TID, each supplement provides 250 kcal and 9 grams of protein  NUTRITION DIAGNOSIS:   Malnutrition (Severe) related to chronic illness (IBS) as evidenced by moderate depletion of body fat, severe depletion of body fat, moderate depletions of muscle mass, severe depletion of muscle mass.  GOAL:   Patient will meet greater than or equal to 90% of their needs  MONITOR:   PO intake, Supplement acceptance, Labs, Weight trends, Skin, I & O's  REASON FOR ASSESSMENT:   Consult Assessment of nutrition requirement/status  ASSESSMENT:   [name] is a 83 y.o. female with a history of IBS, arthritis. Patient presents with worsening productive cough with blood-streaked sputum.   Pt admitted with lower lt lobe pneumonia.   SLP evaluated and recommended a dysphagia 3 diet with thin liquids. Pt does not want a feeding tube and has accepted potential aspiration risks.   Spoke with pt, who reports she currently has a good appetite. She is really enjoying the Boost Breeze supplements. She shares that she has lost "a lot" of weight over the past year, related to her struggles with IBS. Pt reports that she has identified her main trigger foods (red meat, potatoes, fresh vegetables, and fried foods). She consumes 3 meals and several small snacks daily at home and receive assistance for home health aides; commonly consumed foods include chicken pot pie, grilled cheese, and pimento cheese sandwiches.   Pt reports UBW around 120#. She estimates she has lost around 120# within the past year, however, unable to confirm wt changes at this time.   Nutrition-Focused physical exam completed. Findings are moderate to severe fat depletion, moderate to severe muscle depletion, and no edema.   Discussed ways to could increase calories and protein in her diet.  She refused Ensure supplements, but would like to continue with Boost Breeze. Discussed ways she could obtain Boost Breeze outside the hospital.   Labs reviewed.   Diet Order:  DIET DYS 3 Room service appropriate? Yes; Fluid consistency: Thin  Skin:  Reviewed, no issues  Last BM:  04/06/17  Height:   Ht Readings from Last 1 Encounters:  04/04/17 4\' 11"  (1.499 m)    Weight:   Wt Readings from Last 1 Encounters:  04/04/17 103 lb (46.7 kg)    Ideal Body Weight:  44.5 kg  BMI:  Body mass index is 20.8 kg/m.  Estimated Nutritional Needs:   Kcal:  1150-1350  Protein:  50-65 grams  Fluid:  1.1-1.3 L  EDUCATION NEEDS:   Education needs addressed  Elijio Staples A. Jimmye Norman, RD, LDN, CDE Pager: [phone] After hours Pager: [phone]

## 2017-04-07 ENCOUNTER — Inpatient Hospital Stay (HOSPITAL_COMMUNITY): Payer: Medicare Other

## 2017-04-07 DIAGNOSIS — K219 Gastro-esophageal reflux disease without esophagitis: Secondary | ICD-10-CM

## 2017-04-07 DIAGNOSIS — R0602 Shortness of breath: Secondary | ICD-10-CM

## 2017-04-07 DIAGNOSIS — K589 Irritable bowel syndrome without diarrhea: Secondary | ICD-10-CM

## 2017-04-07 DIAGNOSIS — E43 Unspecified severe protein-calorie malnutrition: Secondary | ICD-10-CM | POA: Insufficient documentation

## 2017-04-07 DIAGNOSIS — R634 Abnormal weight loss: Secondary | ICD-10-CM

## 2017-04-07 LAB — CBC
HEMATOCRIT: 25.8 % — AB (ref 36.0–46.0)
Hemoglobin: 8.3 g/dL — ABNORMAL LOW (ref 12.0–15.0)
MCH: 32 pg (ref 26.0–34.0)
MCHC: 32.2 g/dL (ref 30.0–36.0)
MCV: 99.6 fL (ref 78.0–100.0)
Platelets: 429 10*3/uL — ABNORMAL HIGH (ref 150–400)
RBC: 2.59 MIL/uL — ABNORMAL LOW (ref 3.87–5.11)
RDW: 14 % (ref 11.5–15.5)
WBC: 5.7 10*3/uL (ref 4.0–10.5)

## 2017-04-07 LAB — BASIC METABOLIC PANEL
Anion gap: 9 (ref 5–15)
BUN: 26 mg/dL — AB (ref 6–20)
CO2: 29 mmol/L (ref 22–32)
Calcium: 9.3 mg/dL (ref 8.9–10.3)
Chloride: 98 mmol/L — ABNORMAL LOW (ref 101–111)
Creatinine, Ser: 0.98 mg/dL (ref 0.44–1.00)
GFR calc Af Amer: 59 mL/min — ABNORMAL LOW (ref >60)
GFR calc non Af Amer: 51 mL/min — ABNORMAL LOW (ref >60)
GLUCOSE: 99 mg/dL (ref 65–99)
POTASSIUM: 3.9 mmol/L (ref 3.5–5.1)
Sodium: 136 mmol/L (ref 135–145)

## 2017-04-07 LAB — EXPECTORATED SPUTUM ASSESSMENT W GRAM STAIN, RFLX TO RESP C

## 2017-04-07 LAB — EXPECTORATED SPUTUM ASSESSMENT W REFEX TO RESP CULTURE

## 2017-04-07 MED ORDER — LIDOCAINE HCL (PF) 1 % IJ SOLN
INTRAMUSCULAR | Status: AC
  Filled 2017-04-07: qty 30

## 2017-04-07 MED ORDER — BOOST / RESOURCE BREEZE PO LIQD: 1.0000 | Freq: Three times a day (TID) | ORAL | 0 refills | Status: DC

## 2017-04-07 MED ORDER — AMOXICILLIN-POT CLAVULANATE 500-125 MG PO TABS: 500.0000 mg | ORAL_TABLET | Freq: Three times a day (TID) | ORAL | Status: DC

## 2017-04-07 MED ORDER — AMOXICILLIN-POT CLAVULANATE 500-125 MG PO TABS: 500.0000 mg | ORAL_TABLET | Freq: Three times a day (TID) | ORAL | 0 refills | Status: AC

## 2017-04-07 MED ORDER — LEVOFLOXACIN 500 MG PO TABS: 500.0000 mg | ORAL_TABLET | Freq: Every day | ORAL | Status: DC

## 2017-04-07 MED ORDER — LEVOFLOXACIN 500 MG PO TABS
500.0000 mg | ORAL_TABLET | Freq: Every day | ORAL | Status: DC
  Filled 2017-04-07: qty 1

## 2017-04-07 MED ORDER — DOXYCYCLINE HYCLATE 100 MG PO CAPS: 100.0000 mg | ORAL_CAPSULE | Freq: Two times a day (BID) | ORAL | 0 refills | Status: DC

## 2017-04-07 MED ORDER — LEVOFLOXACIN 500 MG PO TABS: 500.0000 mg | ORAL_TABLET | Freq: Every day | ORAL | 0 refills | Status: DC

## 2017-04-07 MED ORDER — CELECOXIB 100 MG PO CAPS: 100.0000 mg | ORAL_CAPSULE | Freq: Two times a day (BID) | ORAL | 0 refills | Status: DC

## 2017-04-07 NOTE — Progress Notes (Signed)
Patient discharged to home around 6p. Discharge teaching provided and patient verbalized understanding.  IV was intact upon removal. Skin intact without signs of breakdown.  Markus Jarvis, LPN 05/12/75

## 2017-04-07 NOTE — Progress Notes (Signed)
PROGRESS NOTE    [name]  SEG:[id number] DOB: [birth year] DOA: 04/04/2017 PCP: Orpah Melter, MD   Chief Complaint  Patient presents with  . Cough  . Shortness of Breath     Brief Narrative:  HPI on 04/04/2017 by Dr. Loma Boston [name] is a 83 y.o. female with a history of IBS, arthritis. Patient presents with worsening productive cough with blood-streaked sputum. Symptoms started and beginning of May. Patient was admitted from 5/4 until 5/8 with community acquired pneumonia was discharged on Augmentin. She continued to have cough but felt improved over the next couple of weeks, but became worse again. She was started on doxycycline, which she completed and was again improving after the course of antibiotics, but worsened earlier this week. She saw her primary care physician and was restarted on doxycycline, however her symptoms continue to worsen. She continues to have purulent sputum streaked with blood. She also admits to approximately 25 pound weight loss over the past year. She is a nonsmoker. No other palliating or provoking factors. She did have a CT of her chest yesterday which showed a rounded masslike consolidation within the left lower lobe measuring approximately 5.2 x 3.3 x 2.6 cm.  Interim history PCCM Consulted and patient was started on antibiotics for healthcare associated pneumonia with Zosyn. Pulmonary conducted bedside ultrasound for possible thoracentesis however felt that patient needed to have this done by interventional radiology. Pending thoracentesis for fluid collection and cytology. Assessment & Plan   Acute Respiratory failure with hypoxia -Upon admission oxygen saturation was 78% on room air secondary to recurrent aspiration pneumonia versus possible malignancy of the LLL -Currently on zosyn -PCCM consulted and appreciated. Supposedly did bedside ultrasound and wants interventional radiology to perform thoracentesis -Pending thoracentesis  and lab results (cytology, etc) -Patient currently stable on room air, maintaining saturations in the high 90s with rest and ambulation. -Patient currently afebrile no leukocytosis -Pending sputum culture -Blood cultures show no growth to date  Dysphagia -Seen by speech therapy 2 months ago and patient does not want to restrict diet or have feeding tube -Continue aspiration precautions, dysphagia 3 with thin liquids  Severe protein, malnutrition -Patient consulted, continue nutritional supplement  Normocytic anemia, chronic -Baseline hemoglobin approximately 8-10 -Hemoglobin currently 8.3 -Iron studies normal, B-12 low-normal, folate normal -Continue B-12 supplementation -TSH mildly elevated 4.9 in the setting of acute illness (all with follow-up with repeat thyroid studies in 4 weeks) -FOBT negative  Arthritis with chronic pain -Continue hydrocodone and Celebrex  Falls at home -Physical therapy consult, no further follow-up needed -Continue fall precautions  DVT Prophylaxis  Lovenox  Code Status: Partial, DO NOT INTUBATE   Family Communication: None   Disposition Plan: Admitted. Suspect discharge to home in 24-48 hours. Pending thoracentesis   Consultants PCCM  Procedures  None  Antibiotics   Anti-infectives    Start     Dose/Rate Route Frequency Ordered Stop   04/05/17 1630  piperacillin-tazobactam (ZOSYN) IVPB 3.375 g     3.375 g 12.5 mL/hr over 240 Minutes Intravenous Every 8 hours 04/04/17 1728     04/05/17 1600  vancomycin (VANCOCIN) 500 mg in sodium chloride 0.9 % 100 mL IVPB  Status:  Discontinued     500 mg 100 mL/hr over 60 Minutes Intravenous Every 24 hours 04/04/17 1526 04/04/17 1715   04/04/17 1530  ceFEPIme (MAXIPIME) 1 g in dextrose 5 % 50 mL IVPB     1 g 100 mL/hr over 30 Minutes Intravenous  Once 04/04/17  1515 04/04/17 1740   04/04/17 1530  vancomycin (VANCOCIN) IVPB 1000 mg/200 mL premix  Status:  Discontinued     1,000 mg 200 mL/hr over 60  Minutes Intravenous  Once 04/04/17 1523 04/04/17 1715      Subjective:   [name] seen and examined today.  Patient feeling better today. Feels her cough has improved mildly. Continues to have productive cough with yellow-green sputum. Denies any chest pain, shortness of breath, abdominal pain, nausea or vomiting, diarrhea or constipation. Wonders when she will be able to go home.  Objective:   Vitals:   04/06/17 0538 04/06/17 1451 04/06/17 2138 04/07/17 0520  BP: 120/64 105/65 120/63 128/61  Pulse: 96 98 91 93  Resp: 16  18 18   Temp: 98 F (36.7 C) 98.7 F (37.1 C) 98.3 F (36.8 C) 97.4 F (36.3 C)  TempSrc: Oral Oral Oral Oral  SpO2: 96% 99% 100% 97%  Weight:      Height:        Intake/Output Summary (Last 24 hours) at 04/07/17 1203 Last data filed at 04/07/17 1028  Gross per 24 hour  Intake              370 ml  Output             1000 ml  Net             -630 ml   Filed Weights   04/04/17 1152  Weight: 46.7 kg (103 lb)    Exam  General: Well developed, Cachectic, elderly, no apparent distress   HEENT: NCAT,  mucous membranes moist.   Cardiovascular: S1 S2 auscultated, 3/6 SEM, RRR  Respiratory: Coarse breath sounds left lower lobe  Abdomen: Soft, nontender, nondistended, + bowel sounds  Extremities: warm dry without cyanosis clubbing or edema  Neuro: AAOx3, nonfocal  Psych: Normal affect and demeanor with intact judgement and insight   Data Reviewed: I have personally reviewed following labs and imaging studies  CBC:  Recent Labs Lab 04/04/17 1149 04/05/17 0652 04/06/17 0605 04/07/17 0355  WBC 7.2 5.9 4.8 5.7  HGB 8.9* 9.3* 8.9* 8.3*  HCT 27.2* 28.5* 27.0* 25.8*  MCV 99.6 99.0 100.4* 99.6  PLT 522* 466* 458* 878*   Basic Metabolic Panel:  Recent Labs Lab 04/04/17 1149 04/05/17 0652 04/06/17 0605 04/07/17 0355  NA 135 135 137 136  K 4.1 4.4 4.1 3.9  CL 100* 100* 102 98*  CO2 25 27 27 29   GLUCOSE 106* 91 92 99  BUN 17 19 24*  26*  CREATININE 1.02* 1.04* 1.05* 0.98  CALCIUM 9.4 9.5 9.5 9.3   GFR: Estimated Creatinine Clearance: 28.6 mL/min (by C-G formula based on SCr of 0.98 mg/dL). Liver Function Tests: No results for input(s): AST, ALT, ALKPHOS, BILITOT, PROT, ALBUMIN in the last 168 hours. No results for input(s): LIPASE, AMYLASE in the last 168 hours. No results for input(s): AMMONIA in the last 168 hours. Coagulation Profile:  Recent Labs Lab 04/06/17 0605  INR 1.17   Cardiac Enzymes: No results for input(s): CKTOTAL, CKMB, CKMBINDEX, TROPONINI in the last 168 hours. BNP (last 3 results) No results for input(s): PROBNP in the last 8760 hours. HbA1C: No results for input(s): HGBA1C in the last 72 hours. CBG: No results for input(s): GLUCAP in the last 168 hours. Lipid Profile: No results for input(s): CHOL, HDL, LDLCALC, TRIG, CHOLHDL, LDLDIRECT in the last 72 hours. Thyroid Function Tests:  Recent Labs  04/06/17 0605  TSH 4.931*   Anemia  Panel:  Recent Labs  04/06/17 0605  VITAMINB12 217  FOLATE 12.8  FERRITIN 137  TIBC 234*  IRON 34   Urine analysis: No results found for: COLORURINE, APPEARANCEUR, LABSPEC, PHURINE, GLUCOSEU, HGBUR, BILIRUBINUR, KETONESUR, PROTEINUR, UROBILINOGEN, NITRITE, LEUKOCYTESUR Sepsis Labs: @LABRCNTIP (procalcitonin:4,lacticidven:4)  ) Recent Results (from the past 240 hour(s))  Blood culture (routine x 2)     Status: None (Preliminary result)   Collection Time: 04/04/17  3:30 PM  Result Value Ref Range Status   Specimen Description BLOOD RIGHT ARM  Final   Special Requests   Final    BOTTLES DRAWN AEROBIC AND ANAEROBIC Blood Culture adequate volume   Culture NO GROWTH 2 DAYS  Final   Report Status PENDING  Incomplete  Blood culture (routine x 2)     Status: None (Preliminary result)   Collection Time: 04/04/17  3:40 PM  Result Value Ref Range Status   Specimen Description BLOOD RIGHT HAND  Final   Special Requests IN PEDIATRIC BOTTLE Blood  Culture adequate volume  Final   Culture NO GROWTH 2 DAYS  Final   Report Status PENDING  Incomplete  Culture, sputum-assessment     Status: None (Preliminary result)   Collection Time: 04/06/17  7:04 AM  Result Value Ref Range Status   Specimen Description SPUTUM  Final   Special Requests NONE  Final   Sputum evaluation THIS SPECIMEN IS ACCEPTABLE FOR SPUTUM CULTURE  Final   Report Status PENDING  Incomplete  Culture, respiratory (NON-Expectorated)     Status: None (Preliminary result)   Collection Time: 04/06/17  7:04 AM  Result Value Ref Range Status   Specimen Description SPUTUM  Final   Special Requests NONE Reflexed from [id number]  Final   Gram Stain   Final    ABUNDANT WBC PRESENT, PREDOMINANTLY PMN ABUNDANT GRAM POSITIVE COCCI IN CHAINS RARE GRAM VARIABLE ROD    Culture CULTURE REINCUBATED FOR BETTER GROWTH  Final   Report Status PENDING  Incomplete      Radiology Studies: No results found.   Scheduled Meds: . celecoxib  200 mg Oral BID  . DULoxetine  60 mg Oral QHS  . enoxaparin (LOVENOX) injection  30 mg Subcutaneous Q24H  . feeding supplement  1 Container Oral TID BM  . pantoprazole  40 mg Oral Daily  . polyethylene glycol  17 g Oral TID  . senna  2 tablet Oral QHS  . triazolam  0.25 mg Oral QHS   Continuous Infusions: . piperacillin-tazobactam (ZOSYN)  IV Stopped (04/07/17 0942)     LOS: 3 days   Time Spent in minutes   30 minutes  Tymar Polyak D.O. on 04/07/2017 at 12:03 PM  Between 7am to 7pm - Pager - [phone]  After 7pm go to [email or web address] - password [email or web address]  And look for the night coverage person covering for me after hours  [facility] Office  [phone]

## 2017-04-07 NOTE — Progress Notes (Signed)
IR consulted for thoracentesis of right pleural effusion.  Limited Chest US demonstrates small amount of fluid in right chest, no fluid on left, which is not amenable to thoracentesis.   No procedure performed, patient returned to unit.  Brynda Greathouse, MMS RDN PA-C

## 2017-04-07 NOTE — Progress Notes (Signed)
   Name: [name] MRN: [id number] DOB: [birth year]    ADMISSION DATE:  04/04/2017 CONSULTATION DATE:  7/1  REFERRING MD :  [facility]  CHIEF COMPLAINT: Mass lll  BRIEF PATIENT DESCRIPTION:  83 yo never smoke with multiple medical problems with recent aspiration pneumonia , discharged on 02/10/2017. She has IBS and has lost weight over the last year. Swallow eval 5/5 with severe risk for aspiration with all consistencies and dysphagia 3 diet recommended.  CT chest 6/29 which demonstrated 5.2x3.3x2.6 cm mass like consolidation LLL. PCCM asked to evaluate.  SIGNIFICANT EVENTS  6/30 > Presents with new hemoptysis   STUDIES:  CT Chest 6/29 > Rounded masslike consolidation within the left lower lobe, measuring 5.2 x 3.3 x 2.6 cm. New moderate to large right pleural effusion. New small left pleural effusion. Small lymph nodes within the mediastinum, fairly numerous but none of which are pathologic by CT size criteria, likely reactive, neoplastic lymphadenopathy not excluded. Cardiomegaly.Marked degenerative change at each shoulder, with extensive soft tissue thickening within and surrounding each joint space and associated fluid in the joint space, right greater than left, incompletely imaged, presumed chronic inflammatory arthritis. Recommend correlation for any acute sequela. Consider plain film correlation if any acute symptoms.  SUBJECTIVE:  Laying in bed. States ready to go home.   VITAL SIGNS: Temp:  [97.4 F (36.3 C)-98.4 F (36.9 C)] 98.4 F (36.9 C) (07/03 1519) Pulse Rate:  [91-97] 97 (07/03 1519) Resp:  [18] 18 (07/03 1519) BP: (117-128)/(61-65) 117/65 (07/03 1519) SpO2:  [97 %-100 %] 100 % (07/03 1519)  PHYSICAL EXAMINATION: General:  Thin frail elderly female Neuro:  Alert, oriented, grossly intact  HEENT: Normocephalic  Cardiovascular: RRR, no MRG  Lungs: non-labored, diminished to bases, non-labored  Abdomen: non-tender, active bowel sounds  Musculoskeletal: -edema    Skin:  Warm, dry, intact    Recent Labs Lab 04/05/17 0652 04/06/17 0605 04/07/17 0355  NA 135 137 136  K 4.4 4.1 3.9  CL 100* 102 98*  CO2 27 27 29   BUN 19 24* 26*  CREATININE 1.04* 1.05* 0.98  GLUCOSE 91 92 99    Recent Labs Lab 04/05/17 0652 04/06/17 0605 04/07/17 0355  HGB 9.3* 8.9* 8.3*  HCT 28.5* 27.0* 25.8*  WBC 5.9 4.8 5.7  PLT 466* 458* 429*   No results found.   Consolidation lung (HCC) left lower lobe, pulmonary abscess vs malignancy  Hemoptysis  H/O Aspiration PNA  PLAN: -Maintain Oxygenation > 92 (currently on Room Air)  -IR evaluated and deemed fluid to small to tap  -Dicussed with Pulmonologist > will discontinue doxycycline and start Augmentin 500 mg for 14 days   --- Decided against Levaquin given QTC of 518  -Follow up in pulmonary office outpatient on 7/13 at 0915 for further work up   -Okay for discharge with outpatient appointment as above   Hayden Pedro, AGACNP-BC [facility]  Pgr: [phone]  PCCM Pgr: [phone]

## 2017-04-07 NOTE — Discharge Instructions (Signed)

## 2017-04-07 NOTE — Progress Notes (Signed)
PT Cancellation Note  Patient Details Name: [name] MRN: [id number] DOB: 22-Jul-1930   Cancelled Treatment:    Reason Eval/Treat Not Completed: Patient at procedure or test/unavailable PT will check on pt later as time allows.    Salina April, PTA Pager: [phone]   04/07/2017, 1:30 PM

## 2017-04-07 NOTE — Discharge Summary (Addendum)
Physician Discharge Summary  [name] BPZ:[id number] DOB: 13-Sep-1930 DOA: 04/04/2017  PCP: Orpah Melter, MD  Admit date: 04/04/2017 Discharge date: 04/07/2017  Time spent: 45 minutes  Recommendations for Outpatient Follow-up:  Patient will be discharged to home.  Patient will need to follow up with primary care provider within one week of discharge.  Follow up with pulmonology, 04/17/2017 at 9:15am. Patient should continue medications as prescribed.  Patient should follow a dysphagia 3 diet.   Discharge Diagnoses:  Acute Respiratory failure with hypoxia Dysphagia Severe protein, malnutrition Normocytic anemia, chronic Arthritis with chronic pain Falls at home  Discharge Condition: Stable  Diet recommendation: dysphagia 3  Filed Weights   04/04/17 1152  Weight: 46.7 kg (103 lb)    History of present illness:  on 04/04/2017 by Dr. Norma Fredrickson [facility] a 83 y.o.femalewith a history of IBS, arthritis. Patient presents with worsening productive cough with blood-streaked sputum. Symptoms started and beginning of May. Patient was admitted from 5/4 until 5/8 with community acquired pneumonia was discharged on Augmentin. She continued to have cough but felt improved over the next couple of weeks, but became worse again. She was started on doxycycline, which she completed and was again improving after the course of antibiotics, but worsened earlier this week. She saw her primary care physician and was restarted on doxycycline, however her symptoms continue to worsen. She continues to have purulent sputum streaked with blood. She also admits to approximately 25 pound weight loss over the past year. She is a nonsmoker. No other palliating or provoking factors. She did have a CT of her chest yesterday which showed a rounded masslike consolidation within the left lower lobe measuring approximately 5.2 x 3.3 x 2.6 cm.  Hospital Course:  Acute Respiratory failure with  hypoxia -Upon admission oxygen saturation was 78% on room air secondary to recurrent aspiration pneumonia versus possible malignancy of the LLL -Currently on zosyn -PCCM consulted and appreciated. Supposedly did bedside ultrasound and wants interventional radiology to perform thoracentesis -IR consulted and appreciated. Limited Chest US showed small amount of fluid in the right chest, no fluid on the left.  -Patient currently stable on room air, maintaining saturations in the high 90s with rest and ambulation. -Patient currently afebrile no leukocytosis -Pending sputum culture- can be follow up as an outpatient  -Blood cultures show no growth to date -Patient will need to follow up with pulmonology. Will discharge with Augmentin  Dysphagia -Seen by speech therapy 2 months ago and patient does not want to restrict diet or have feeding tube -Continue aspiration precautions, dysphagia 3 with thin liquids  Severe protein, malnutrition -Patient consulted, continue nutritional supplement  Normocytic anemia, chronic -Baseline hemoglobin approximately 8-10 -Hemoglobin currently 8.3 -Iron studies normal, B-12 low-normal, folate normal -Continue B-12 supplementation -TSH mildly elevated 4.9 in the setting of acute illness (all with follow-up with repeat thyroid studies in 4 weeks) -FOBT negative  Arthritis with chronic pain -Continue hydrocodone and Celebrex  Falls at home -Physical therapy consult, no further follow-up needed -Continue fall precautions  Consultants PCCM  Procedures  None  Discharge Exam: Vitals:   04/07/17 0520 04/07/17 1519  BP: 128/61 117/65  Pulse: 93 97  Resp: 18 18  Temp: 97.4 F (36.3 C) 98.4 F (36.9 C)   Feeling better today. Wishes to go home. Denies chest pain, abdominal pain, N/V/D/C. Feels breathing has improved.    General: Well developed, cachectic, elderly, no apparent distress  HEENT: NCAT,  mucous membranes moist.  Cardiovascular:  S1 S2 auscultated, 3/6SEM, RRR  Respiratory: Clear, mild insp wheezing.   Abdomen: Soft, nontender, nondistended, + bowel sounds  Extremities: warm dry without cyanosis clubbing or edema  Psych: Normal affect and demeanor with intact judgement and insight  Discharge Instructions Discharge Instructions    Discharge instructions    Complete by:  As directed    Patient will be discharged to home.  Patient will need to follow up with primary care provider within one week of discharge.  Follow up with pulmonology, 04/17/2017 at 9:15am. Patient should continue medications as prescribed.  Patient should follow a dysphagia 3 diet.     Current Discharge Medication List    START taking these medications   Details  amoxicillin-clavulanate (AUGMENTIN) 500-125 MG tablet Take 1 tablet (500 mg total) by mouth every 8 (eight) hours. Qty: 42 tablet, Refills: 0    feeding supplement (BOOST / RESOURCE BREEZE) LIQD Take 1 Container by mouth 3 (three) times daily between meals. Qty: 90 Container, Refills: 0      CONTINUE these medications which have CHANGED   Details  celecoxib (CELEBREX) 100 MG capsule Take 1 capsule (100 mg total) by mouth 2 (two) times daily. Qty: 60 capsule, Refills: 0      CONTINUE these medications which have NOT CHANGED   Details  DULoxetine (CYMBALTA) 60 MG capsule Take 60 mg by mouth at bedtime.  Refills: 5    HYDROcodone-acetaminophen (NORCO) 7.5-325 MG tablet Take 1 tablet by mouth every 4 (four) hours as needed (pain).  Refills: 0    hydrocortisone 2.5 % cream Apply 1 application topically 2 (two) times daily as needed (rash).  Refills: 1    pantoprazole (PROTONIX) 40 MG tablet Take 40 mg by mouth daily. Refills: 5    polyethylene glycol (MIRALAX / GLYCOLAX) packet Take 34 g by mouth at bedtime. Mix 2 capfuls (34 grams) in 6-8 oz liquid and drink    rOPINIRole (REQUIP) 0.25 MG tablet Take 1 tablet (0.25 mg total) by mouth daily as needed (severe restless  legs). Qty: 30 tablet, Refills: 0    Tetrahydrozoline HCl (VISINE OP) Place 1 drop into both eyes daily as needed (dry eyes/irritation).    triazolam (HALCION) 0.25 MG tablet Take 0.25 mg by mouth at bedtime. Refills: 5    benzonatate (TESSALON) 100 MG capsule Take 100 mg by mouth 3 (three) times daily as needed for cough.  Refills: 0      STOP taking these medications     doxycycline (VIBRAMYCIN) 100 MG capsule      amoxicillin-clavulanate (AUGMENTIN) 875-125 MG tablet        Allergies  Allergen Reactions  . Cerefolin [L-Methylfolate-B12-B6-B2] Nausea Only  . Codeine Nausea And Vomiting and Other (See Comments)    dizziness  . Lactose Intolerance (Gi) Other (See Comments)    Constipation and bloating   Follow-up Information    Orpah Melter, MD. Schedule an appointment as soon as possible for a visit in 1 week(s).   Specialty:  Family Medicine Why:  Hospital follow up  Contact information: [address] [place] [address] [phone]        Javier Glazier, MD Follow up on 04/17/2017.   Specialty:  Pulmonary Disease Why:  7/13 at [place] information: [address] [place] [address] [phone]            The results of significant diagnostics from this hospitalization (including imaging, microbiology, ancillary and laboratory) are listed below  for reference.    Significant Diagnostic Studies: Ct Chest W Contrast  Result Date: 04/03/2017 CLINICAL DATA:  Recurrent pneumonia for 3 months. EXAM: CT CHEST WITH CONTRAST TECHNIQUE: Multidetector CT imaging of the chest was performed during intravenous contrast administration. CONTRAST:  34mL ISOVUE-300 IOPAMIDOL (ISOVUE-300) INJECTION 61% COMPARISON:  CT abdomen dated 03/18/2017. Chest x-ray dated 03/03/2017. FINDINGS: Cardiovascular: Cardiomegaly.  No pericardial effusion. Mild aortic atherosclerosis.  No aortic aneurysm or dissection. Mediastinum/Nodes: Small lymph nodes  within the mediastinum, fairly numerous, largest within the subcarinal space with 1.1 cm short axis dimension. Esophagus is unremarkable. Trachea and central bronchi are unremarkable. Lungs/Pleura: New right pleural effusion, moderate to large in size, with associated compressive atelectasis at the right lung base. New small left pleural effusion. Rounded masslike consolidation within the left lower lobe, abutting the fissure, measuring 5.2 cm craniocaudal dimension, 3.3 cm transverse dimension and 2.6 cm AP dimension (series 2, image 85), pneumonia versus neoplastic mass versus rounded atelectasis. Upper Abdomen: No acute findings on limited views of the upper abdomen. Musculoskeletal: Extensive degenerative change at each shoulder, with extensive soft tissue thickening and fluid at each joint space, right greater than left, with associated subluxation of the right humeral head relative to the glenoid fossa, presumably chronic inflammatory arthritis. IMPRESSION: 1. Rounded masslike consolidation within the left lower lobe, measuring 5.2 x 3.3 x 2.6 cm. Differential considerations include pneumonia, neoplastic mass with central necrosis and rounded atelectasis with trapped fluid. At minimum, recommend follow-up CT to ensure resolution. Could also consider further characterization with PET-CT or tissue sampling. 2. New moderate to large right pleural effusion. New small left pleural effusion. 3. Small lymph nodes within the mediastinum, fairly numerous but none of which are pathologic by CT size criteria, likely reactive, neoplastic lymphadenopathy not excluded. 4. Cardiomegaly. 5. Marked degenerative change at each shoulder, with extensive soft tissue thickening within and surrounding each joint space and associated fluid in the joint space, right greater than left, incompletely imaged, presumed chronic inflammatory arthritis. Recommend correlation for any acute sequela. Consider plain film correlation if any acute  symptoms. Aortic Atherosclerosis (ICD10-I70.0). These results will be called to the ordering clinician or representative by the Radiologist Assistant, and communication documented in the PACS or zVision Dashboard. Electronically Signed   By: Franki Cabot M.D.   On: 04/03/2017 15:25   Ct Abdomen Pelvis W Contrast  Result Date: 03/18/2017 CLINICAL DATA:  Weight loss of 20 pounds. Bright red blood per rectum. EXAM: CT ABDOMEN AND PELVIS WITH CONTRAST TECHNIQUE: Multidetector CT imaging of the abdomen and pelvis was performed using the standard protocol following bolus administration of intravenous contrast. CONTRAST:  151mL ISOVUE-300 IOPAMIDOL (ISOVUE-300) INJECTION 61% COMPARISON:  None. FINDINGS: Lower chest: Anterior aspect of left lower lobe airspace consolidation, versus atelectasis or scarring. Small left pleural effusion. Hepatobiliary: Homogeneous attenuation of the liver. Too small to be actually characterized 8 mm hypoattenuated lesion within the left lobe of the liver. Normal gallbladder. Pancreas: Atrophic pancreas. Spleen: Normal in size without focal abnormality. Adrenals/Urinary Tract: Adrenal glands are unremarkable. Kidneys are normal, without renal calculi, focal lesion, or hydronephrosis. Bladder is unremarkable. Stomach/Bowel: Stomach is within normal limits. No evidence of small bowel wall thickening, distention, or inflammatory changes. Diffuse mucosal thickening of the rectum. Fluid-filled descending colon. Heterogeneous appearance of the hepatic flexure of the colon, poorly visualized due to presence of formed stool. Vascular/Lymphatic: Mild aortic atherosclerosis. No enlarged abdominal or pelvic lymph nodes. Reproductive: Uterus and bilateral adnexa are unremarkable. Other: No abdominal wall hernia  or abnormality. No abdominopelvic ascites. Musculoskeletal: Osteopenia. Prior spinal fusion with intact hardware. IMPRESSION: Diffuse mucosal thickening of the rectum. This may represent  infectious/inflammatory changes, however malignancy cannot be excluded. Fluid-filled descending colon, which may be due to diarrheal state or intraluminal bleeding. Heterogeneous appearance of the hepatic flexure of the colon, poorly visualized due to presence of formed stool. Correlation with colonoscopy is recommended when found clinically feasible. 8 mm hypoattenuated lesion within the left lobe of the liver, too small to be actually characterized. Mild calcific atherosclerotic disease of the aorta. Osteopenia. Electronically Signed   By: Fidela Salisbury M.D.   On: 03/18/2017 14:14    Microbiology: Recent Results (from the past 240 hour(s))  Blood culture (routine x 2)     Status: None (Preliminary result)   Collection Time: 04/04/17  3:30 PM  Result Value Ref Range Status   Specimen Description BLOOD RIGHT ARM  Final   Special Requests   Final    BOTTLES DRAWN AEROBIC AND ANAEROBIC Blood Culture adequate volume   Culture NO GROWTH 3 DAYS  Final   Report Status PENDING  Incomplete  Blood culture (routine x 2)     Status: None (Preliminary result)   Collection Time: 04/04/17  3:40 PM  Result Value Ref Range Status   Specimen Description BLOOD RIGHT HAND  Final   Special Requests IN PEDIATRIC BOTTLE Blood Culture adequate volume  Final   Culture NO GROWTH 3 DAYS  Final   Report Status PENDING  Incomplete  Culture, sputum-assessment     Status: None   Collection Time: 04/06/17  7:04 AM  Result Value Ref Range Status   Specimen Description SPUTUM  Final   Special Requests NONE  Final   Sputum evaluation THIS SPECIMEN IS ACCEPTABLE FOR SPUTUM CULTURE  Final   Report Status 04/07/2017 FINAL  Final  Culture, respiratory (NON-Expectorated)     Status: None (Preliminary result)   Collection Time: 04/06/17  7:04 AM  Result Value Ref Range Status   Specimen Description SPUTUM  Final   Special Requests NONE Reflexed from [id number]  Final   Gram Stain   Final    ABUNDANT WBC PRESENT,  PREDOMINANTLY PMN ABUNDANT GRAM POSITIVE COCCI IN CHAINS RARE GRAM VARIABLE ROD    Culture CULTURE REINCUBATED FOR BETTER GROWTH  Final   Report Status PENDING  Incomplete     Labs: Basic Metabolic Panel:  Recent Labs Lab 04/04/17 1149 04/05/17 0652 04/06/17 0605 04/07/17 0355  NA 135 135 137 136  K 4.1 4.4 4.1 3.9  CL 100* 100* 102 98*  CO2 25 27 27 29   GLUCOSE 106* 91 92 99  BUN 17 19 24* 26*  CREATININE 1.02* 1.04* 1.05* 0.98  CALCIUM 9.4 9.5 9.5 9.3   Liver Function Tests: No results for input(s): AST, ALT, ALKPHOS, BILITOT, PROT, ALBUMIN in the last 168 hours. No results for input(s): LIPASE, AMYLASE in the last 168 hours. No results for input(s): AMMONIA in the last 168 hours. CBC:  Recent Labs Lab 04/04/17 1149 04/05/17 0652 04/06/17 0605 04/07/17 0355  WBC 7.2 5.9 4.8 5.7  HGB 8.9* 9.3* 8.9* 8.3*  HCT 27.2* 28.5* 27.0* 25.8*  MCV 99.6 99.0 100.4* 99.6  PLT 522* 466* 458* 429*   Cardiac Enzymes: No results for input(s): CKTOTAL, CKMB, CKMBINDEX, TROPONINI in the last 168 hours. BNP: BNP (last 3 results) No results for input(s): BNP in the last 8760 hours.  ProBNP (last 3 results) No results for input(s): PROBNP in the last  8760 hours.  CBG: No results for input(s): GLUCAP in the last 168 hours.     SignedCristal Ford  [facility] 04/07/2017, 4:20 PM

## 2017-04-08 LAB — CULTURE, RESPIRATORY W GRAM STAIN: Culture: NORMAL

## 2017-04-08 LAB — CULTURE, RESPIRATORY

## 2017-04-09 LAB — CULTURE, BLOOD (ROUTINE X 2)
CULTURE: NO GROWTH
CULTURE: NO GROWTH
Special Requests: ADEQUATE
Special Requests: ADEQUATE

## 2017-04-17 ENCOUNTER — Encounter: Payer: Self-pay | Admitting: Pulmonary Disease

## 2017-04-17 ENCOUNTER — Other Ambulatory Visit (INDEPENDENT_AMBULATORY_CARE_PROVIDER_SITE_OTHER): Payer: Medicare Other

## 2017-04-17 ENCOUNTER — Ambulatory Visit (INDEPENDENT_AMBULATORY_CARE_PROVIDER_SITE_OTHER): Payer: Medicare Other | Admitting: Pulmonary Disease

## 2017-04-17 VITALS — BP 124/80 | HR 98 | Ht 59.0 in | Wt 102.2 lb

## 2017-04-17 DIAGNOSIS — J90 Pleural effusion, not elsewhere classified: Secondary | ICD-10-CM | POA: Diagnosis not present

## 2017-04-17 DIAGNOSIS — J479 Bronchiectasis, uncomplicated: Secondary | ICD-10-CM

## 2017-04-17 DIAGNOSIS — R918 Other nonspecific abnormal finding of lung field: Secondary | ICD-10-CM

## 2017-04-17 LAB — SEDIMENTATION RATE: SED RATE: 50 mm/h — AB (ref 0–30)

## 2017-04-17 LAB — C-REACTIVE PROTEIN: CRP: 0.8 mg/dL (ref 0.5–20.0)

## 2017-04-17 MED ORDER — FLUTTER DEVI: 0 refills | Status: DC

## 2017-04-17 NOTE — Patient Instructions (Addendum)
   Call our office if you have any new breathing problems or questions before your next appointment.  You can use Mucinex or its active ingredient Guaifenesin twice daily with fluids to help thin your mucus and let you cough it up.  I want you to use your Flutter Valve/Acapella that we are giving you today by blowing into it 3 times in a row twice daily.  We will review your test results at your follow-up.   TESTS ORDERED: 1. CXR PA/LAT at follow-up appointment 2. Serum ANA, ESR, CRP, Rheumatoid Factor, Anti-CCP, SCL-70, Centromere Antibody Screen, & Quantitative Immunoglobulin Panel.

## 2017-04-17 NOTE — Progress Notes (Signed)
Subjective:    Patient ID: [name], female    DOB: [birth year], 83 y.o.   MRN: [id number]  [facility].:  Follow-up after hospitalization with Left Lower Lobe Consolidation, Bilateral Pleural Effusions, & Bronchiectasis.  HPI Patient with known dysphagia and unwillingness to restrict diet and fluids. Suspect aspiration pneumonia to some degree. Patient also with frequent falls at home.  Lower lobe consolidation: Patient hospitalized repeatedly for pneumonia. Treated with multiple courses of antibiotics. Eventually treated with Zosyn at last hospitalization. She denies any dyspnea. Still has a cough producing a clear to "greenish" mucus. She reports she is eating softer foods and eating smaller bites of meat. She is completing a course of Augmentin.   Bilateral pleural effusions: Patient evaluated for thoracentesis by interventional radiology but insufficient fluid exists. To perform thoracentesis. Denies any lower extremity edema.   Bronchiectasis: Seen by my review on chest CT imaging. She had hemoptysis on presented mixed in with mucus. Denies any recent hemoptysis. Denies any history of recurrent bronchitis. No history of breathing problems or asthma as a child.   Review of Systems No fever, chills or sweats. No chest pain or pressure. No abdominal pain, nausea, or emesis. Does aspirate some with water per her report. She does have occasional reflux.   Allergies  Allergen Reactions  . Cerefolin [L-Methylfolate-B12-B6-B2] Nausea Only  . Codeine Nausea And Vomiting and Other (See Comments)    dizziness  . Lactose Intolerance (Gi) Other (See Comments)    Constipation and bloating    Current Outpatient Prescriptions on File Prior to Visit  Medication Sig Dispense Refill  . amoxicillin-clavulanate (AUGMENTIN) 500-125 MG tablet Take 1 tablet (500 mg total) by mouth every 8 (eight) hours. 42 tablet 0  . celecoxib (CELEBREX) 100 MG capsule Take 1 capsule (100 mg total) by mouth 2 (two)  times daily. 60 capsule 0  . DULoxetine (CYMBALTA) 60 MG capsule Take 60 mg by mouth at bedtime.   5  . feeding supplement (BOOST / RESOURCE BREEZE) LIQD Take 1 Container by mouth 3 (three) times daily between meals. 90 Container 0  . HYDROcodone-acetaminophen (NORCO) 7.5-325 MG tablet Take 1 tablet by mouth every 4 (four) hours as needed (pain).   0  . hydrocortisone 2.5 % cream Apply 1 application topically 2 (two) times daily as needed (rash).   1  . pantoprazole (PROTONIX) 40 MG tablet Take 40 mg by mouth daily.  5  . polyethylene glycol (MIRALAX / GLYCOLAX) packet Take 34 g by mouth at bedtime. Mix 2 capfuls (34 grams) in 6-8 oz liquid and drink    . rOPINIRole (REQUIP) 0.25 MG tablet Take 1 tablet (0.25 mg total) by mouth daily as needed (severe restless legs). 30 tablet 0  . Tetrahydrozoline HCl (VISINE OP) Place 1 drop into both eyes daily as needed (dry eyes/irritation).    . triazolam (HALCION) 0.25 MG tablet Take 0.25 mg by mouth at bedtime.  5  . benzonatate (TESSALON) 100 MG capsule Take 100 mg by mouth 3 (three) times daily as needed for cough.   0   No current facility-administered medications on file prior to visit.     Past Medical History:  Diagnosis Date  . Arthritis   . IBS (irritable bowel syndrome)   . Pneumonia     No past surgical history on file.  No family history on file.  Social History   Social History  . Marital status: Widowed    Spouse name: N/A  . Number of children:  N/A  . Years of education: N/A   Social History Main Topics  . Smoking status: Never Smoker  . Smokeless tobacco: Never Used  . Alcohol use No  . Drug use: No  . Sexual activity: Not Currently   Other Topics Concern  . None   Social History Narrative  . None      Objective:   Physical Exam BP 124/80 (BP Location: Left Arm, Patient Position: Sitting, Cuff Size: Small)   Pulse 98   Ht 4\' 11"  (1.499 m)   Wt 102 lb 3.2 oz (46.4 kg)   SpO2 100%   BMI 20.64 kg/m    General:  Awake. Alert. No distress.Elderly Caucasian female. Accompanied by son today. Integument:  Warm & dry. No rash on exposed skin.  Extremities:  No cyanosis or clubbing.  HEENT:  Moist mucus membranes. No oral ulcers. No scleral injection or icterus.  Cardiovascular:  Regular rate. No edema. Systolic ejection murmur appreciated JVD.  Pulmonary:  Good aeration & clear to auscultation bilaterally. Symmetric chest wall expansion. No accessory muscle use on room air. Abdomen: Soft. Normal bowel sounds. Nondistended.  Musculoskeletal:  Normal bulk and tone. No joint effusion appreciated.  IMAGING CT CHEST W/ CONTRAST 04/03/17 (personally reviewed by me):  Patent esophagus without wall thickening through the majority of the thorax. Precarinal lymph node measuring 1.1 cm in short axis. Cardiomegaly is noted. No pericardial effusion. Bilateral pleural effusions right greater than left.  CARDIAC TTE (02/09/15):  LV normal in size with mild focal basal hypertrophy of the septum. EF 65-70% with no regional wall motion abnormalities & grade 1 diastolic dysfunction. LA & RA normal in size. RV normal in size and function. Mild aortic stenosis with no regurgitation. Aortic root normal in size. Moderate mitral regurgitation without stenosis. No pulmonic regurgitation or stenosis. Trivial tricuspid regurgitation. No pericardial effusion. Mild bronchiectasis present bilaterally without predilection for particular lobe. Area of opacification adjacent fissure on the left within left lower lobe consistent with consolidation given air bronchograms. No other parenchymal mass or opacity appreciated.  MICROBIOLOGY Sputum Culture 04/06/17:  Normal respiratory flora. Urine Streptococcal Antigen 04/05/17:  Negative  Urine Legionella Antigen 04/05/17:  Negative     Assessment & Plan:  83 y.o. female recovering from left lower lobe consolidation most consistent with a pneumonia. Suspect some element of aspiration given  previous speech evaluation. Bilateral pleural effusions are somewhat perplexing as I feel they were more related to some element of congestive heart failure rather than pneumonia. Interestingly, she does have some mild bronchiectasis by my review of her CT imaging. This could be the cause for some of her ongoing cough. I instructed the patient and her son to contact me if she had any new breathing problems or questions before her next appointment.  1. Left lower lobe consolidation:  Plan for repeat chest x-ray PA/LAT at follow-up appointment. 2. Bilateral pleural effusions: Repeating chest x-ray at follow-up appointment. If there is recurrence would consider repeat echocardiogram to evaluate valvular dysfunction. 3. Bronchiectasis: Starting patient on airway clearance with Mucinex twice daily & flutter valve. Ordering autoimmune workup. 4. Follow-up: Return to clinic in 4 weeks or sooner if needed.  Sonia Baller Ashok Cordia, M.D. [facility] Pulmonary & Critical Care Pager:  [phone] After 3pm or if no response, call [phone] 9:39 AM 04/17/17

## 2017-04-17 NOTE — Progress Notes (Signed)
Patient seen in the office today and instructed on use of flutter device.  Patient expressed understanding and demonstrated technique. 

## 2017-04-20 LAB — ANTI-NUCLEAR AB-TITER (ANA TITER): ANA Titer 1: 1:160 {titer} — ABNORMAL HIGH

## 2017-04-20 LAB — CENTROMERE ANTIBODIES: Centromere Ab Screen: <1

## 2017-04-20 LAB — IGG, IGA, IGM
IgA: 90 mg/dL (ref 81–463)
IgG (Immunoglobin G), Serum: 1009 mg/dL (ref 694–1618)
IgM, Serum: 31 mg/dL — ABNORMAL LOW (ref 48–271)

## 2017-04-20 LAB — CYCLIC CITRUL PEPTIDE ANTIBODY, IGG: CYCLIC CITRULLIN PEPTIDE AB: 17 U

## 2017-04-20 LAB — RHEUMATOID FACTOR

## 2017-04-20 LAB — ANA: ANA: POSITIVE — AB

## 2017-04-20 LAB — ANTI-SCLERODERMA ANTIBODY: SCLERODERMA (SCL-70) (ENA) ANTIBODY, IGG: NEGATIVE

## 2017-05-01 ENCOUNTER — Emergency Department (HOSPITAL_COMMUNITY): Payer: Medicare Other

## 2017-05-01 ENCOUNTER — Inpatient Hospital Stay (HOSPITAL_COMMUNITY): Payer: Medicare Other | Admitting: Certified Registered Nurse Anesthetist

## 2017-05-01 ENCOUNTER — Encounter (HOSPITAL_COMMUNITY): Payer: Self-pay | Admitting: Emergency Medicine

## 2017-05-01 ENCOUNTER — Inpatient Hospital Stay (HOSPITAL_COMMUNITY): Payer: Medicare Other

## 2017-05-01 ENCOUNTER — Encounter (HOSPITAL_COMMUNITY)
Admission: EM | Disposition: A | Discharge status: Skilled Nursing Facility | Payer: Self-pay | Source: Home / Self Care | Attending: Internal Medicine

## 2017-05-01 ENCOUNTER — Inpatient Hospital Stay (HOSPITAL_COMMUNITY)
Admission: EM | Admit: 2017-05-01 | Discharge: 2017-05-04 | DRG: 480 | Disposition: A | Discharge status: Skilled Nursing Facility | Payer: Medicare Other | Attending: Internal Medicine | Admitting: Internal Medicine

## 2017-05-01 DIAGNOSIS — K589 Irritable bowel syndrome without diarrhea: Secondary | ICD-10-CM | POA: Diagnosis present

## 2017-05-01 DIAGNOSIS — Z419 Encounter for procedure for purposes other than remedying health state, unspecified: Secondary | ICD-10-CM

## 2017-05-01 DIAGNOSIS — D62 Acute posthemorrhagic anemia: Secondary | ICD-10-CM | POA: Diagnosis not present

## 2017-05-01 DIAGNOSIS — F32A Depression, unspecified: Secondary | ICD-10-CM

## 2017-05-01 DIAGNOSIS — W010XXA Fall on same level from slipping, tripping and stumbling without subsequent striking against object, initial encounter: Secondary | ICD-10-CM | POA: Diagnosis present

## 2017-05-01 DIAGNOSIS — S72141A Displaced intertrochanteric fracture of right femur, initial encounter for closed fracture: Secondary | ICD-10-CM | POA: Diagnosis present

## 2017-05-01 DIAGNOSIS — S72001A Fracture of unspecified part of neck of right femur, initial encounter for closed fracture: Secondary | ICD-10-CM

## 2017-05-01 DIAGNOSIS — Z96651 Presence of right artificial knee joint: Secondary | ICD-10-CM | POA: Diagnosis present

## 2017-05-01 DIAGNOSIS — J181 Lobar pneumonia, unspecified organism: Secondary | ICD-10-CM | POA: Diagnosis not present

## 2017-05-01 DIAGNOSIS — Z66 Do not resuscitate: Secondary | ICD-10-CM | POA: Diagnosis present

## 2017-05-01 DIAGNOSIS — D638 Anemia in other chronic diseases classified elsewhere: Secondary | ICD-10-CM | POA: Diagnosis present

## 2017-05-01 DIAGNOSIS — J90 Pleural effusion, not elsewhere classified: Secondary | ICD-10-CM | POA: Diagnosis present

## 2017-05-01 DIAGNOSIS — Z6823 Body mass index (BMI) 23.0-23.9, adult: Secondary | ICD-10-CM

## 2017-05-01 DIAGNOSIS — R634 Abnormal weight loss: Secondary | ICD-10-CM | POA: Diagnosis present

## 2017-05-01 DIAGNOSIS — J479 Bronchiectasis, uncomplicated: Secondary | ICD-10-CM

## 2017-05-01 DIAGNOSIS — E43 Unspecified severe protein-calorie malnutrition: Secondary | ICD-10-CM | POA: Diagnosis present

## 2017-05-01 DIAGNOSIS — K219 Gastro-esophageal reflux disease without esophagitis: Secondary | ICD-10-CM | POA: Diagnosis present

## 2017-05-01 DIAGNOSIS — G8929 Other chronic pain: Secondary | ICD-10-CM | POA: Diagnosis present

## 2017-05-01 DIAGNOSIS — K5909 Other constipation: Secondary | ICD-10-CM | POA: Diagnosis present

## 2017-05-01 DIAGNOSIS — G2581 Restless legs syndrome: Secondary | ICD-10-CM | POA: Diagnosis present

## 2017-05-01 DIAGNOSIS — R1310 Dysphagia, unspecified: Secondary | ICD-10-CM | POA: Diagnosis not present

## 2017-05-01 DIAGNOSIS — W19XXXA Unspecified fall, initial encounter: Secondary | ICD-10-CM

## 2017-05-01 DIAGNOSIS — M1990 Unspecified osteoarthritis, unspecified site: Secondary | ICD-10-CM | POA: Diagnosis present

## 2017-05-01 DIAGNOSIS — S0990XA Unspecified injury of head, initial encounter: Secondary | ICD-10-CM | POA: Diagnosis present

## 2017-05-01 DIAGNOSIS — F329 Major depressive disorder, single episode, unspecified: Secondary | ICD-10-CM

## 2017-05-01 DIAGNOSIS — Y92002 Bathroom of unspecified non-institutional (private) residence single-family (private) house as the place of occurrence of the external cause: Secondary | ICD-10-CM

## 2017-05-01 DIAGNOSIS — M25551 Pain in right hip: Secondary | ICD-10-CM | POA: Diagnosis present

## 2017-05-01 DIAGNOSIS — S72009A Fracture of unspecified part of neck of unspecified femur, initial encounter for closed fracture: Secondary | ICD-10-CM | POA: Diagnosis present

## 2017-05-01 HISTORY — PX: INTRAMEDULLARY (IM) NAIL INTERTROCHANTERIC: SHX5875

## 2017-05-01 HISTORY — DX: Bronchiectasis, uncomplicated: J47.9

## 2017-05-01 HISTORY — DX: Essential (primary) hypertension: I10

## 2017-05-01 LAB — CBC WITH DIFFERENTIAL/PLATELET
Basophils Absolute: 0 10*3/uL (ref 0.0–0.1)
Basophils Relative: 0 %
EOS ABS: 0.1 10*3/uL (ref 0.0–0.7)
EOS PCT: 1 %
HCT: 27.5 % — ABNORMAL LOW (ref 36.0–46.0)
Hemoglobin: 8.9 g/dL — ABNORMAL LOW (ref 12.0–15.0)
LYMPHS ABS: 1.9 10*3/uL (ref 0.7–4.0)
LYMPHS PCT: 22 %
MCH: 32.5 pg (ref 26.0–34.0)
MCHC: 32.4 g/dL (ref 30.0–36.0)
MCV: 100.4 fL — AB (ref 78.0–100.0)
MONO ABS: 0.5 10*3/uL (ref 0.1–1.0)
MONOS PCT: 6 %
Neutro Abs: 6.2 10*3/uL (ref 1.7–7.7)
Neutrophils Relative %: 71 %
PLATELETS: 357 10*3/uL (ref 150–400)
RBC: 2.74 MIL/uL — ABNORMAL LOW (ref 3.87–5.11)
RDW: 14.5 % (ref 11.5–15.5)
WBC: 8.7 10*3/uL (ref 4.0–10.5)

## 2017-05-01 LAB — PROTIME-INR
INR: 1.17
PROTHROMBIN TIME: 15 s (ref 11.4–15.2)

## 2017-05-01 LAB — PREALBUMIN: Prealbumin: 15.2 mg/dL — ABNORMAL LOW (ref 18–38)

## 2017-05-01 LAB — BASIC METABOLIC PANEL
Anion gap: 10 (ref 5–15)
BUN: 24 mg/dL — AB (ref 6–20)
CHLORIDE: 101 mmol/L (ref 101–111)
CO2: 27 mmol/L (ref 22–32)
CREATININE: 0.86 mg/dL (ref 0.44–1.00)
Calcium: 10.1 mg/dL (ref 8.9–10.3)
GFR calc Af Amer: >60 mL/min (ref >60)
GFR calc non Af Amer: 60 mL/min — ABNORMAL LOW (ref >60)
GLUCOSE: 106 mg/dL — AB (ref 65–99)
POTASSIUM: 4.1 mmol/L (ref 3.5–5.1)
SODIUM: 138 mmol/L (ref 135–145)

## 2017-05-01 LAB — SURGICAL PCR SCREEN
MRSA, PCR: NEGATIVE
STAPHYLOCOCCUS AUREUS: NEGATIVE

## 2017-05-01 LAB — PREPARE RBC (CROSSMATCH)

## 2017-05-01 SURGERY — FIXATION, FRACTURE, INTERTROCHANTERIC, WITH INTRAMEDULLARY ROD
Anesthesia: General | Site: Hip | Laterality: Right

## 2017-05-01 MED ORDER — ONDANSETRON HCL 4 MG/2ML IJ SOLN
INTRAMUSCULAR | Status: DC | PRN
  Administered 2017-05-01: 4 mg via INTRAVENOUS

## 2017-05-01 MED ORDER — TETRAHYDROZOLINE HCL 0.05 % OP SOLN: 1.0000 [drp] | Freq: Every day | OPHTHALMIC | Status: DC | PRN

## 2017-05-01 MED ORDER — LACTATED RINGERS IV SOLN
INTRAVENOUS | Status: DC | PRN
  Administered 2017-05-01: 12:00:00 via INTRAVENOUS

## 2017-05-01 MED ORDER — CHLORHEXIDINE GLUCONATE 4 % EX LIQD
60.0000 mL | Freq: Once | CUTANEOUS | Status: AC
  Administered 2017-05-01: 4 via TOPICAL

## 2017-05-01 MED ORDER — DEXAMETHASONE SODIUM PHOSPHATE 10 MG/ML IJ SOLN
INTRAMUSCULAR | Status: DC | PRN
  Administered 2017-05-01: 10 mg via INTRAVENOUS

## 2017-05-01 MED ORDER — DEXTROSE 5 % IV SOLN: 500.0000 mg | Freq: Four times a day (QID) | INTRAVENOUS | Status: DC | PRN

## 2017-05-01 MED ORDER — ROCURONIUM BROMIDE 10 MG/ML (PF) SYRINGE
PREFILLED_SYRINGE | INTRAVENOUS | Status: DC | PRN
  Administered 2017-05-01: 20 mg via INTRAVENOUS

## 2017-05-01 MED ORDER — CEFAZOLIN SODIUM-DEXTROSE 1-4 GM/50ML-% IV SOLN
1.0000 g | Freq: Four times a day (QID) | INTRAVENOUS | Status: AC
  Administered 2017-05-01 – 2017-05-02 (×3): 1 g via INTRAVENOUS
  Filled 2017-05-01 (×3): qty 50

## 2017-05-01 MED ORDER — ALBUTEROL SULFATE (2.5 MG/3ML) 0.083% IN NEBU: 2.5000 mg | INHALATION_SOLUTION | RESPIRATORY_TRACT | Status: DC | PRN

## 2017-05-01 MED ORDER — SODIUM CHLORIDE 0.9 % IV SOLN
INTRAVENOUS | Status: DC
  Administered 2017-05-01 – 2017-05-02 (×3): via INTRAVENOUS

## 2017-05-01 MED ORDER — SUGAMMADEX SODIUM 200 MG/2ML IV SOLN
INTRAVENOUS | Status: AC
  Filled 2017-05-01: qty 2

## 2017-05-01 MED ORDER — SENNOSIDES-DOCUSATE SODIUM 8.6-50 MG PO TABS: 1.0000 | ORAL_TABLET | Freq: Every evening | ORAL | Status: DC | PRN

## 2017-05-01 MED ORDER — POVIDONE-IODINE 10 % EX SWAB
2.0000 "application " | Freq: Once | CUTANEOUS | Status: AC
  Administered 2017-05-01: 2 via TOPICAL

## 2017-05-01 MED ORDER — MORPHINE SULFATE (PF) 4 MG/ML IV SOLN
1.0000 mg | INTRAVENOUS | Status: DC | PRN
  Administered 2017-05-01 – 2017-05-04 (×10): 1 mg via INTRAVENOUS
  Filled 2017-05-01 (×10): qty 1

## 2017-05-01 MED ORDER — CEFAZOLIN SODIUM-DEXTROSE 2-4 GM/100ML-% IV SOLN
2.0000 g | INTRAVENOUS | Status: AC
  Administered 2017-05-01: 2 g via INTRAVENOUS
  Filled 2017-05-01 (×2): qty 100

## 2017-05-01 MED ORDER — FLEET ENEMA 7-19 GM/118ML RE ENEM: 1.0000 | ENEMA | Freq: Once | RECTAL | Status: DC | PRN

## 2017-05-01 MED ORDER — BENZONATATE 100 MG PO CAPS: 100.0000 mg | ORAL_CAPSULE | Freq: Three times a day (TID) | ORAL | Status: DC | PRN

## 2017-05-01 MED ORDER — SUGAMMADEX SODIUM 200 MG/2ML IV SOLN
INTRAVENOUS | Status: DC | PRN
  Administered 2017-05-01: 100 mg via INTRAVENOUS

## 2017-05-01 MED ORDER — METHOCARBAMOL 500 MG PO TABS
500.0000 mg | ORAL_TABLET | Freq: Four times a day (QID) | ORAL | Status: DC | PRN
  Administered 2017-05-02 – 2017-05-04 (×4): 500 mg via ORAL
  Filled 2017-05-01 (×4): qty 1

## 2017-05-01 MED ORDER — SUCCINYLCHOLINE CHLORIDE 200 MG/10ML IV SOSY
PREFILLED_SYRINGE | INTRAVENOUS | Status: DC | PRN
  Administered 2017-05-01: 100 mg via INTRAVENOUS

## 2017-05-01 MED ORDER — MORPHINE SULFATE (PF) 4 MG/ML IV SOLN
6.0000 mg | Freq: Once | INTRAVENOUS | Status: AC
  Administered 2017-05-01: 6 mg via INTRAVENOUS
  Filled 2017-05-01: qty 2

## 2017-05-01 MED ORDER — PHENYLEPHRINE 40 MCG/ML (10ML) SYRINGE FOR IV PUSH (FOR BLOOD PRESSURE SUPPORT)
PREFILLED_SYRINGE | INTRAVENOUS | Status: AC
  Filled 2017-05-01: qty 10

## 2017-05-01 MED ORDER — GUAIFENESIN ER 600 MG PO TB12: 600.0000 mg | ORAL_TABLET | Freq: Two times a day (BID) | ORAL | Status: DC | PRN

## 2017-05-01 MED ORDER — ONDANSETRON HCL 4 MG/2ML IJ SOLN
INTRAMUSCULAR | Status: AC
  Filled 2017-05-01: qty 2

## 2017-05-01 MED ORDER — BOOST / RESOURCE BREEZE PO LIQD
1.0000 | Freq: Three times a day (TID) | ORAL | Status: DC
  Administered 2017-05-02 – 2017-05-03 (×5): 1 via ORAL

## 2017-05-01 MED ORDER — 0.9 % SODIUM CHLORIDE (POUR BTL) OPTIME
TOPICAL | Status: DC | PRN
  Administered 2017-05-01: 1000 mL

## 2017-05-01 MED ORDER — DEXTROSE 5 % IV SOLN
INTRAVENOUS | Status: DC | PRN
  Administered 2017-05-01: 30 ug/min via INTRAVENOUS

## 2017-05-01 MED ORDER — POLYETHYLENE GLYCOL 3350 17 G PO PACK
34.0000 g | PACK | Freq: Every day | ORAL | Status: DC
  Administered 2017-05-01 – 2017-05-03 (×3): 34 g via ORAL
  Filled 2017-05-01 (×3): qty 2

## 2017-05-01 MED ORDER — PHENYLEPHRINE 40 MCG/ML (10ML) SYRINGE FOR IV PUSH (FOR BLOOD PRESSURE SUPPORT)
PREFILLED_SYRINGE | INTRAVENOUS | Status: DC | PRN
  Administered 2017-05-01: 120 ug via INTRAVENOUS
  Administered 2017-05-01: 80 ug via INTRAVENOUS
  Administered 2017-05-01: 120 ug via INTRAVENOUS

## 2017-05-01 MED ORDER — FENTANYL CITRATE (PF) 100 MCG/2ML IJ SOLN
INTRAMUSCULAR | Status: DC | PRN
  Administered 2017-05-01: 25 ug via INTRAVENOUS
  Administered 2017-05-01: 50 ug via INTRAVENOUS
  Administered 2017-05-01: 25 ug via INTRAVENOUS

## 2017-05-01 MED ORDER — ASPIRIN EC 81 MG PO TBEC: 81.0000 mg | DELAYED_RELEASE_TABLET | Freq: Two times a day (BID) | ORAL | 2 refills | Status: AC

## 2017-05-01 MED ORDER — CELECOXIB 100 MG PO CAPS
100.0000 mg | ORAL_CAPSULE | Freq: Two times a day (BID) | ORAL | Status: DC
  Administered 2017-05-01 – 2017-05-04 (×6): 100 mg via ORAL
  Filled 2017-05-01 (×7): qty 1

## 2017-05-01 MED ORDER — SENNA 8.6 MG PO TABS
1.0000 | ORAL_TABLET | Freq: Two times a day (BID) | ORAL | Status: DC
  Administered 2017-05-01 – 2017-05-04 (×6): 8.6 mg via ORAL
  Filled 2017-05-01 (×6): qty 1

## 2017-05-01 MED ORDER — METOCLOPRAMIDE HCL 5 MG/ML IJ SOLN: 5.0000 mg | Freq: Three times a day (TID) | INTRAMUSCULAR | Status: DC | PRN

## 2017-05-01 MED ORDER — LACTATED RINGERS IV SOLN
INTRAVENOUS | Status: DC | PRN
  Administered 2017-05-01: 14:00:00 via INTRAVENOUS

## 2017-05-01 MED ORDER — ACETAMINOPHEN 650 MG RE SUPP: 650.0000 mg | Freq: Four times a day (QID) | RECTAL | Status: DC | PRN

## 2017-05-01 MED ORDER — FENTANYL CITRATE (PF) 100 MCG/2ML IJ SOLN
25.0000 ug | INTRAMUSCULAR | Status: DC | PRN
  Administered 2017-05-01 (×2): 50 ug via INTRAVENOUS

## 2017-05-01 MED ORDER — ENOXAPARIN SODIUM 30 MG/0.3ML ~~LOC~~ SOLN
30.0000 mg | SUBCUTANEOUS | Status: DC
  Administered 2017-05-02 – 2017-05-04 (×3): 30 mg via SUBCUTANEOUS
  Filled 2017-05-01 (×3): qty 0.3

## 2017-05-01 MED ORDER — ACETAMINOPHEN 325 MG PO TABS
650.0000 mg | ORAL_TABLET | Freq: Four times a day (QID) | ORAL | Status: DC | PRN
  Administered 2017-05-02 – 2017-05-04 (×3): 650 mg via ORAL
  Filled 2017-05-01 (×3): qty 2

## 2017-05-01 MED ORDER — SODIUM CHLORIDE 0.9 % IV SOLN: Freq: Once | INTRAVENOUS | Status: DC

## 2017-05-01 MED ORDER — HYDROCODONE-ACETAMINOPHEN 7.5-325 MG PO TABS: 1.0000 | ORAL_TABLET | Freq: Four times a day (QID) | ORAL | 0 refills | Status: DC | PRN

## 2017-05-01 MED ORDER — FENTANYL CITRATE (PF) 250 MCG/5ML IJ SOLN
INTRAMUSCULAR | Status: AC
  Filled 2017-05-01: qty 5

## 2017-05-01 MED ORDER — FENTANYL CITRATE (PF) 100 MCG/2ML IJ SOLN
INTRAMUSCULAR | Status: AC
  Filled 2017-05-01: qty 2

## 2017-05-01 MED ORDER — BISACODYL 5 MG PO TBEC: 5.0000 mg | DELAYED_RELEASE_TABLET | Freq: Every day | ORAL | Status: DC | PRN

## 2017-05-01 MED ORDER — METOCLOPRAMIDE HCL 5 MG PO TABS: 5.0000 mg | ORAL_TABLET | Freq: Three times a day (TID) | ORAL | Status: DC | PRN

## 2017-05-01 MED ORDER — FLUTTER DEVI: 1.0000 | Freq: Every day | Status: DC

## 2017-05-01 MED ORDER — LIDOCAINE 2% (20 MG/ML) 5 ML SYRINGE
INTRAMUSCULAR | Status: DC | PRN
  Administered 2017-05-01: 60 mg via INTRAVENOUS

## 2017-05-01 MED ORDER — ONDANSETRON HCL 4 MG PO TABS: 4.0000 mg | ORAL_TABLET | Freq: Four times a day (QID) | ORAL | Status: DC | PRN

## 2017-05-01 MED ORDER — HYDROCODONE-ACETAMINOPHEN 7.5-325 MG PO TABS
1.0000 | ORAL_TABLET | ORAL | Status: DC | PRN
  Administered 2017-05-01 – 2017-05-04 (×10): 1 via ORAL
  Filled 2017-05-01 (×10): qty 1

## 2017-05-01 MED ORDER — ONDANSETRON HCL 4 MG/2ML IJ SOLN: 4.0000 mg | Freq: Four times a day (QID) | INTRAMUSCULAR | Status: DC | PRN

## 2017-05-01 MED ORDER — ROPINIROLE HCL 0.5 MG PO TABS: 0.2500 mg | ORAL_TABLET | Freq: Every day | ORAL | Status: DC | PRN

## 2017-05-01 MED ORDER — PROPOFOL 10 MG/ML IV BOLUS
INTRAVENOUS | Status: DC | PRN
  Administered 2017-05-01: 70 mg via INTRAVENOUS

## 2017-05-01 MED ORDER — DULOXETINE HCL 60 MG PO CPEP
60.0000 mg | ORAL_CAPSULE | Freq: Every day | ORAL | Status: DC
  Administered 2017-05-01 – 2017-05-03 (×3): 60 mg via ORAL
  Filled 2017-05-01 (×3): qty 1

## 2017-05-01 MED ORDER — MORPHINE SULFATE (PF) 4 MG/ML IV SOLN
4.0000 mg | Freq: Once | INTRAVENOUS | Status: AC
  Administered 2017-05-01: 4 mg via INTRAVENOUS
  Filled 2017-05-01: qty 1

## 2017-05-01 MED ORDER — TRIAZOLAM 0.125 MG PO TABS
0.2500 mg | ORAL_TABLET | Freq: Every day | ORAL | Status: DC
  Administered 2017-05-01 – 2017-05-03 (×3): 0.25 mg via ORAL
  Filled 2017-05-01 (×3): qty 2

## 2017-05-01 MED ORDER — PANTOPRAZOLE SODIUM 40 MG PO TBEC
40.0000 mg | DELAYED_RELEASE_TABLET | Freq: Every day | ORAL | Status: DC
  Administered 2017-05-01 – 2017-05-04 (×4): 40 mg via ORAL
  Filled 2017-05-01 (×3): qty 1

## 2017-05-01 MED ORDER — DEXAMETHASONE SODIUM PHOSPHATE 10 MG/ML IJ SOLN
INTRAMUSCULAR | Status: AC
  Filled 2017-05-01: qty 1

## 2017-05-01 SURGICAL SUPPLY — 49 items
BIT DRILL FLUTED FEMUR 4.2/3 (BIT) ×2 IMPLANT
BLADE SURG 15 STRL LF DISP TIS (BLADE) ×1 IMPLANT
BLADE SURG 15 STRL SS (BLADE) ×3
BNDG COHESIVE 4X5 TAN NS LF (GAUZE/BANDAGES/DRESSINGS) ×3 IMPLANT
BNDG COHESIVE 6X5 TAN STRL LF (GAUZE/BANDAGES/DRESSINGS) IMPLANT
BNDG GAUZE ELAST 4 BULKY (GAUZE/BANDAGES/DRESSINGS) ×3 IMPLANT
COVER PERINEAL POST (MISCELLANEOUS) ×3 IMPLANT
COVER SURGICAL LIGHT HANDLE (MISCELLANEOUS) ×3 IMPLANT
DRAPE HALF SHEET 40X57 (DRAPES) IMPLANT
DRAPE STERI IOBAN 125X83 (DRAPES) ×3 IMPLANT
DRESSING ALLEVYN LIFE SACRUM (GAUZE/BANDAGES/DRESSINGS) ×2 IMPLANT
DRSG ADAPTIC 3X8 NADH LF (GAUZE/BANDAGES/DRESSINGS) ×3 IMPLANT
DRSG MEPILEX BORDER 4X4 (GAUZE/BANDAGES/DRESSINGS) ×7 IMPLANT
DRSG MEPILEX BORDER 4X8 (GAUZE/BANDAGES/DRESSINGS) ×3 IMPLANT
DRSG PAD ABDOMINAL 8X10 ST (GAUZE/BANDAGES/DRESSINGS) ×6 IMPLANT
DURAPREP 26ML APPLICATOR (WOUND CARE) ×3 IMPLANT
ELECT CAUTERY BLADE 6.4 (BLADE) ×3 IMPLANT
ELECT REM PT RETURN 9FT ADLT (ELECTROSURGICAL) ×3
ELECTRODE REM PT RTRN 9FT ADLT (ELECTROSURGICAL) ×1 IMPLANT
FACESHIELD WRAPAROUND (MASK) ×3 IMPLANT
FACESHIELD WRAPAROUND OR TEAM (MASK) ×1 IMPLANT
GAUZE XEROFORM 5X9 LF (GAUZE/BANDAGES/DRESSINGS) ×3 IMPLANT
GLOVE BIO SURGEON STRL SZ7.5 (GLOVE) ×3 IMPLANT
GLOVE BIOGEL PI IND STRL 8 (GLOVE) ×1 IMPLANT
GLOVE BIOGEL PI INDICATOR 8 (GLOVE) ×2
GOWN STRL REUS W/ TWL LRG LVL3 (GOWN DISPOSABLE) ×2 IMPLANT
GOWN STRL REUS W/ TWL XL LVL3 (GOWN DISPOSABLE) ×1 IMPLANT
GOWN STRL REUS W/TWL LRG LVL3 (GOWN DISPOSABLE) ×6
GOWN STRL REUS W/TWL XL LVL3 (GOWN DISPOSABLE) ×3
KIT BASIN OR (CUSTOM PROCEDURE TRAY) ×3 IMPLANT
KIT ROOM TURNOVER OR (KITS) ×3 IMPLANT
LINER BOOT UNIVERSAL DISP (MISCELLANEOUS) ×3 IMPLANT
MANIFOLD NEPTUNE II (INSTRUMENTS) ×3 IMPLANT
NAIL TROCH FIX 10X235 RT 130 (Nail) ×3 IMPLANT
NS IRRIG 1000ML POUR BTL (IV SOLUTION) ×3 IMPLANT
PACK GENERAL/GYN (CUSTOM PROCEDURE TRAY) ×3 IMPLANT
PAD ARMBOARD 7.5X6 YLW CONV (MISCELLANEOUS) ×6 IMPLANT
PAD CAST 4YDX4 CTTN HI CHSV (CAST SUPPLIES) ×2 IMPLANT
PADDING CAST COTTON 4X4 STRL (CAST SUPPLIES) ×6
REAMER ROD DEEP FLUTE 2.5X950 (INSTRUMENTS) ×2 IMPLANT
SCREW CANN LOCK TI FT 5X30 (Screw) ×2 IMPLANT
SCREW FEMORAL CFNA 10.35X100MM (Screw) ×2 IMPLANT
STAPLER VISISTAT 35W (STAPLE) ×3 IMPLANT
SUT MON AB 2-0 CT1 36 (SUTURE) ×3 IMPLANT
SUT VIC AB 2-0 CT1 27 (SUTURE) ×3
SUT VIC AB 2-0 CT1 TAPERPNT 27 (SUTURE) IMPLANT
TOWEL OR 17X24 6PK STRL BLUE (TOWEL DISPOSABLE) ×3 IMPLANT
TOWEL OR 17X26 10 PK STRL BLUE (TOWEL DISPOSABLE) ×3 IMPLANT
WATER STERILE IRR 1000ML POUR (IV SOLUTION) ×3 IMPLANT

## 2017-05-01 NOTE — ED Notes (Signed)
Patient transported to X-ray 

## 2017-05-01 NOTE — Brief Op Note (Signed)
05/01/2017  2:34 PM  PATIENT:  Danella Sensing  83 y.o. female  PRE-OPERATIVE DIAGNOSIS:  right hip fracture  POST-OPERATIVE DIAGNOSIS:  right hip fracture  PROCEDURE:  Procedure(s): INTRAMEDULLARY (IM) NAIL INTERTROCHANTRIC (Right)  SURGEON:  Surgeon(s) and Role:    * Nicholes Stairs, MD - Primary  PHYSICIAN ASSISTANT:   ASSISTANTS: none   ANESTHESIA:   general  EBL:  Total I/O In: 250 [I.V.:250] Out: 500 [Urine:350; Blood:150]  BLOOD ADMINISTERED: 1 unit pRBCs  DRAINS: none   LOCAL MEDICATIONS USED:  NONE  SPECIMEN:  No Specimen  DISPOSITION OF SPECIMEN:  N/A  COUNTS:  YES  TOURNIQUET:  * No tourniquets in log *  DICTATION: .Note written in EPIC  PLAN OF CARE: Admit to inpatient   PATIENT DISPOSITION:  PACU - hemodynamically stable.   Delay start of Pharmacological VTE agent (>24hrs) due to surgical blood loss or risk of bleeding: not applicable

## 2017-05-01 NOTE — Anesthesia Postprocedure Evaluation (Signed)
Anesthesia Post Note  Patient: [name] [facility]  Procedure(s) Performed: Procedure(s) (LRB): INTRAMEDULLARY (IM) NAIL INTERTROCHANTRIC (Right)     Patient location during evaluation: PACU Anesthesia Type: General Level of consciousness: awake and alert Pain management: pain level controlled Vital Signs Assessment: post-procedure vital signs reviewed and stable Respiratory status: spontaneous breathing, nonlabored ventilation, respiratory function stable and patient connected to nasal cannula oxygen Cardiovascular status: blood pressure returned to baseline and stable Postop Assessment: no signs of nausea or vomiting Anesthetic complications: no    Last Vitals:  Vitals:   05/01/17 1525 05/01/17 1555  BP:  120/65  Pulse:  87  Resp:  18  Temp: 36.5 C 36.5 C    Last Pain:  Vitals:   05/01/17 1525  TempSrc:   PainSc: Asleep                 Mahagony Grieb,E. Taher Vannote

## 2017-05-01 NOTE — Anesthesia Procedure Notes (Signed)
Procedure Name: Intubation Date/Time: 05/01/2017 1:16 PM Performed by: Luciana Axe K Pre-anesthesia Checklist: Patient identified, Emergency Drugs available, Suction available and Patient being monitored Patient Re-evaluated:Patient Re-evaluated prior to induction Oxygen Delivery Method: Circle System Utilized Preoxygenation: Pre-oxygenation with 100% oxygen Induction Type: IV induction Ventilation: Mask ventilation without difficulty Laryngoscope Size: Miller and 2 Grade View: Grade III Tube type: Oral Tube size: 7.0 mm Number of attempts: 1 Airway Equipment and Method: Stylet and Oral airway Placement Confirmation: ETT inserted through vocal cords under direct vision,  positive ETCO2 and breath sounds checked- equal and bilateral Secured at: 21 cm Tube secured with: Tape Dental Injury: Teeth and Oropharynx as per pre-operative assessment

## 2017-05-01 NOTE — ED Provider Notes (Signed)
[facility] DEPT Provider Note   CSN: [id number] Arrival date & time: 05/01/17  0303     History   Chief Complaint Chief Complaint  Patient presents with  . Fall    HPI [name] is a 83 y.o. female.  HPI Patient presents to the emergency department after slipping and falling in her bathroom.  She fell down and struck her right hip is complaining of moderate to severe pain in her right hip that is worse with range of motion.  She also reports head injury and minor neck pain.  She denies weakness of her arms or legs.  She reports pain in her right shoulder.  Denies back pain at this time.  Patient was in her normal state health earlier today.  She was given 200 g of fentanyl and route by EMS.  She has obvious shortening and rotation of the right lower extremity.   Past Medical History:  Diagnosis Date  . Arthritis   . IBS (irritable bowel syndrome)   . Pneumonia     Patient Active Problem List   Diagnosis Date Noted  . Bronchiectasis without complication ([place]) 47/65/4650  . Protein-calorie malnutrition, severe 04/07/2017  . Pleural effusion   . Consolidation lung ([place]) left lower lobe 04/05/2017  . Loss of weight 04/05/2017  . Cough   . Left lower lobe pneumonia ([place]) 04/04/2017  . GERD (gastroesophageal reflux disease) 04/04/2017  . IBS (irritable bowel syndrome)   . Dysphagia 02/06/2017  . Osteoarthritis 02/06/2017    History reviewed. No pertinent surgical history.  OB History    No data available       Home Medications    Prior to Admission medications   Medication Sig Start Date End Date Taking? Authorizing Provider  benzonatate (TESSALON) 100 MG capsule Take 100 mg by mouth 3 (three) times daily as needed for cough.  02/03/17  Yes [provider]  celecoxib (CELEBREX) 100 MG capsule Take 1 capsule (100 mg total) by mouth 2 (two) times daily. 04/07/17  Yes Mikhail, Velta Addison, DO  DULoxetine (CYMBALTA) 60 MG capsule Take 60 mg by mouth at  bedtime.  12/22/16  Yes [provider]  feeding supplement (BOOST / RESOURCE BREEZE) LIQD Take 1 Container by mouth 3 (three) times daily between meals. 04/07/17  Yes Mikhail, Velta Addison, DO  HYDROcodone-acetaminophen (NORCO) 7.5-325 MG tablet Take 1 tablet by mouth every 4 (four) hours as needed (pain).  01/23/17  Yes [provider]  hydrocortisone 2.5 % cream Apply 1 application topically 2 (two) times daily as needed (rash).  11/06/16  Yes [provider]  pantoprazole (PROTONIX) 40 MG tablet Take 40 mg by mouth daily. 01/19/17  Yes [provider]  polyethylene glycol (MIRALAX / GLYCOLAX) packet Take 34 g by mouth at bedtime. Mix 2 capfuls (34 grams) in 6-8 oz liquid and drink   Yes [provider]  rOPINIRole (REQUIP) 0.25 MG tablet Take 1 tablet (0.25 mg total) by mouth daily as needed (severe restless legs). 02/10/17  Yes Domenic Polite, MD  Tetrahydrozoline HCl (VISINE OP) Place 1 drop into both eyes daily as needed (dry eyes/irritation).   Yes [provider]  triazolam (HALCION) 0.25 MG tablet Take 0.25 mg by mouth at bedtime. 01/22/17  Yes [provider]  Respiratory Therapy Supplies (FLUTTER) DEVI Blow into device 3 times in a row twice daily. 04/17/17   Javier Glazier, MD    Family History No family history on file.  Social History Social History  Substance Use Topics  . Smoking status: Never Smoker  . Smokeless tobacco: Never Used  . Alcohol use No     Allergies   Cerefolin [l-methylfolate-b12-b6-b2]; Codeine; and Lactose intolerance (gi)   Review of Systems Review of Systems  All other systems reviewed and are negative.    Physical Exam Updated Vital Signs BP 112/85   Pulse (!) 109   Temp 98 F (36.7 C) (Oral)   Resp 16   Ht 4\' 9"  (1.448 m)   Wt 49.4 kg (109 lb)   SpO2 95%   BMI 23.59 kg/m   Physical Exam  Constitutional: She is oriented to person, place, and time. She appears well-developed and  well-nourished. No distress.  HENT:  Head: Normocephalic and atraumatic.  Eyes: EOM are normal.  Neck: Neck supple.  Mild cervical and paracervical tenderness without cervical step-off.  Cardiovascular: Normal rate, regular rhythm and normal heart sounds.   Pulmonary/Chest: Effort normal and breath sounds normal. She exhibits no tenderness.  Abdominal: Soft. She exhibits no distension. There is no tenderness.  Musculoskeletal:  Shortening and external rotation of the right lower extremity with painful range of motion of the right hip.  Normal PT and DP pulse in right foot.  Mild pain with range of motion the right shoulder without obvious deformity.  No clavicular tenderness.  Normal strength in her bilateral hands and lower extremities  Neurological: She is alert and oriented to person, place, and time.  Skin: Skin is warm and dry.  Psychiatric: She has a normal mood and affect. Judgment normal.  Nursing note and vitals reviewed.    ED Treatments / Results  Labs (all labs ordered are listed, but only abnormal results are displayed) Labs Reviewed  BASIC METABOLIC PANEL - Abnormal; Notable for the following:       Result Value   Glucose, Bld 106 (*)    BUN 24 (*)    GFR calc non Af Amer 60 (*)    All other components within normal limits  CBC WITH DIFFERENTIAL/PLATELET - Abnormal; Notable for the following:    RBC 2.74 (*)    Hemoglobin 8.9 (*)    HCT 27.5 (*)    MCV 100.4 (*)    All other components within normal limits  PREALBUMIN - Abnormal; Notable for the following:    Prealbumin 15.2 (*)    All other components within normal limits  CBC - Abnormal; Notable for the following:    RBC 2.55 (*)    Hemoglobin 8.1 (*)    HCT 24.4 (*)    RDW 15.9 (*)    All other components within normal limits  BASIC METABOLIC PANEL - Abnormal; Notable for the following:    Sodium 134 (*)    Glucose, Bld 128 (*)    BUN 24 (*)    All other components within normal limits  CBC - Abnormal;  Notable for the following:    RBC 2.07 (*)    Hemoglobin 6.7 (*)    HCT 20.1 (*)    All other components within normal limits  CBC - Abnormal; Notable for the following:    RBC 2.58 (*)    Hemoglobin 8.3 (*)    HCT 24.8 (*)    RDW 15.8 (*)    All other components within normal limits  HEMOGLOBIN AND HEMATOCRIT, BLOOD - Abnormal; Notable for the following:    Hemoglobin 9.6 (*)    HCT 29.1 (*)    All other components within normal limits  CBC - Abnormal; Notable for the following:    RBC 2.77 (*)    Hemoglobin 8.9 (*)    HCT 26.4 (*)    RDW 16.0 (*)    All other components within normal limits  SURGICAL PCR SCREEN  PROTIME-INR  URINALYSIS, ROUTINE W REFLEX MICROSCOPIC  TYPE AND SCREEN  PREPARE RBC (CROSSMATCH)  PREPARE RBC (CROSSMATCH)    EKG  EKG Interpretation  Date/Time:  Friday May 01 2017 07:02:24 EDT Ventricular Rate:  100 PR Interval:    QRS Duration: 131 QT Interval:  404 QTC Calculation: 511 R Axis:   80 Text Interpretation:  Sinus tachycardia Ventricular premature complex Right bundle branch block Artiifact in lead(s) II III aVF V5 V6 No significant change since last tracing Confirmed by Alfonzo Beers [id number]) on 05/03/2017 12:21:42 AM       Radiology Dg Chest 1 View  Result Date: 05/01/2017 CLINICAL DATA:  Pain after falling 1 day ago EXAM: CHEST 1 VIEW COMPARISON:  03/03/2017 FINDINGS: Diffuse interstitial prominence, likely interstitial edema. Perihilar airspace opacities likely represent alveolar edema. Small pleural effusions, right greater than left. Moderate cardiomegaly. IMPRESSION: The findings likely represent congestive heart failure with interstitial and alveolar edema. Electronically Signed   By: Andreas Newport M.D.   On: 05/01/2017 05:27   Ct Head Wo Contrast  Result Date: 05/01/2017 CLINICAL DATA:  Headache.  Fell during the night. EXAM: CT HEAD WITHOUT CONTRAST CT CERVICAL SPINE WITHOUT CONTRAST TECHNIQUE: Multidetector CT imaging of the  head and cervical spine was performed following the standard protocol without intravenous contrast. Multiplanar CT image reconstructions of the cervical spine were also generated. COMPARISON:  12/03/2005 FINDINGS: CT HEAD FINDINGS Brain: There is no intracranial hemorrhage, mass or evidence of acute infarction. There is mild generalized atrophy. There is mild chronic microvascular ischemic change. There is no significant extra-axial fluid collection. No acute intracranial findings are evident. Vascular: No hyperdense vessel or unexpected calcification. Skull: Normal. Negative for fracture or focal lesion. Sinuses/Orbits: No acute finding. Other: None. CT CERVICAL SPINE FINDINGS Alignment: Normal. Skull base and vertebrae: No acute fracture. No primary bone lesion or focal pathologic process. Soft tissues and spinal canal: No prevertebral fluid or swelling. No visible canal hematoma. Disc levels: Moderately severe degenerative cervical disc disease at C5-6 and C6-7. Prominent facet arthritis, greatest at C4-5. Grade 1 degenerative spondylolisthesis at C4-5. Upper chest: Bilateral pleural effusions. Other: None IMPRESSION: 1. No acute intracranial findings. There is mild generalized atrophy and chronic appearing white matter hypodensities which likely represent small vessel ischemic disease. 2. Negative for acute cervical spine fracture. 3. Bilateral pleural effusions. Electronically Signed   By: Andreas Newport M.D.   On: 05/01/2017 05:49   Ct Cervical Spine Wo Contrast  Result Date: 05/01/2017 CLINICAL DATA:  Headache.  Fell during the night. EXAM: CT HEAD WITHOUT CONTRAST CT CERVICAL SPINE WITHOUT CONTRAST TECHNIQUE: Multidetector CT imaging of the head and cervical spine was performed following the standard protocol without intravenous contrast. Multiplanar CT image reconstructions of the cervical spine were also generated. COMPARISON:  12/03/2005 FINDINGS: CT HEAD FINDINGS Brain: There is no intracranial  hemorrhage, mass or evidence of acute infarction. There is mild generalized atrophy. There is mild chronic microvascular ischemic change. There is no significant extra-axial fluid collection. No acute intracranial findings are evident. Vascular: No hyperdense vessel or unexpected calcification. Skull: Normal. Negative for fracture or focal lesion. Sinuses/Orbits: No acute finding. Other: None. CT CERVICAL SPINE FINDINGS Alignment: Normal. Skull base and vertebrae: No acute fracture. No  primary bone lesion or focal pathologic process. Soft tissues and spinal canal: No prevertebral fluid or swelling. No visible canal hematoma. Disc levels: Moderately severe degenerative cervical disc disease at C5-6 and C6-7. Prominent facet arthritis, greatest at C4-5. Grade 1 degenerative spondylolisthesis at C4-5. Upper chest: Bilateral pleural effusions. Other: None IMPRESSION: 1. No acute intracranial findings. There is mild generalized atrophy and chronic appearing white matter hypodensities which likely represent small vessel ischemic disease. 2. Negative for acute cervical spine fracture. 3. Bilateral pleural effusions. Electronically Signed   By: Andreas Newport M.D.   On: 05/01/2017 05:49   Dg Humerus Right  Result Date: 05/01/2017 CLINICAL DATA:  Right upper extremity pain after falling 1 day ago EXAM: RIGHT HUMERUS - 2+ VIEW COMPARISON:  None. FINDINGS: Negative for acute fracture. Severe chronic right shoulder arthropathy. No discrete bone lesion. No acute soft tissue abnormality. IMPRESSION: Negative for acute fracture or dislocation. Severe chronic right shoulder arthropathy. Electronically Signed   By: Andreas Newport M.D.   On: 05/01/2017 05:26   Dg Hip Unilat  With Pelvis 2-3 Views Right  Result Date: 05/01/2017 CLINICAL DATA:  Right hip pain for 1 day after falling EXAM: DG HIP (WITH OR WITHOUT PELVIS) 2-3V RIGHT COMPARISON:  None. FINDINGS: There is an intertrochanteric right hip fracture with moderate  comminution and mild varus angulation. No dislocation. No radiographic evidence of a pathologic basis for the fracture. Bony pelvis is intact. Pubic symphysis and sacroiliac joints appear intact. IMPRESSION: Intertrochanteric right hip fracture Electronically Signed   By: Andreas Newport M.D.   On: 05/01/2017 04:31    Procedures Procedures (including critical care time)  Medications Ordered in ED Medications  morphine 4 MG/ML injection 6 mg (6 mg Intravenous Given 05/01/17 0458)     Initial Impression / Assessment and Plan / ED Course  I have reviewed the triage vital signs and the nursing notes.  Pertinent labs & imaging results that were available during my care of the patient were reviewed by me and considered in my medical decision making (see chart for details).     Patiently admitted for treatment of her hip fracture.  Final Clinical Impressions(s) / ED Diagnoses   Final diagnoses:  Closed displaced intertrochanteric fracture of right femur, initial encounter [facility]  Fall, initial encounter    New Prescriptions New Prescriptions   No medications on file     Jola Schmidt, MD 05/05/17 1224

## 2017-05-01 NOTE — Anesthesia Preprocedure Evaluation (Addendum)
Anesthesia Evaluation  Patient identified by MRN, date of birth, ID band Patient awake    Reviewed: Allergy & Precautions, NPO status , Patient's Chart, lab work & pertinent test results  History of Anesthesia Complications Negative for: history of anesthetic complications  Airway Mallampati: III  TM Distance: >3 FB Neck ROM: Limited    Dental  (+) Dental Advisory Given, Missing, Chipped   Pulmonary pneumonia,  05/01/17 CXR: congestive heart failure with interstitial and alveolar edema.  6/17 Chest CT: left lower lobe consolidation most consistent with a pneumonia. Suspect some element of aspiration    breath sounds clear to auscultation       Cardiovascular hypertension (no meds presently), (-) angina+CHF  + Valvular Problems/Murmurs AS and MR  Rhythm:Regular Rate:Normal  '16 ECHO: EF 65-70%, mod MR, mild AS   Neuro/Psych Depression Chronic pain: daily hydrocodone    GI/Hepatic Neg liver ROS, GERD (chronic aspiration)  Medicated and Controlled,  Endo/Other  negative endocrine ROS  Renal/GU negative Renal ROS     Musculoskeletal  (+) Arthritis , Osteoarthritis,    Abdominal   Peds  Hematology  (+) Blood dyscrasia (Hb 8.9), anemia ,   Anesthesia Other Findings   Reproductive/Obstetrics                           Anesthesia Physical Anesthesia Plan  ASA: III  Anesthesia Plan: General   Post-op Pain Management:    Induction: Intravenous  PONV Risk Score and Plan: 3 and Ondansetron, Dexamethasone and Treatment may vary due to age or medical condition  Airway Management Planned: Oral ETT  Additional Equipment:   Intra-op Plan:   Post-operative Plan: Extubation in OR  Informed Consent: I have reviewed the patients History and Physical, chart, labs and discussed the procedure including the risks, benefits and alternatives for the proposed anesthesia with the patient or authorized  representative who has indicated his/her understanding and acceptance.   Dental advisory given and Consent reviewed with POA  Plan Discussed with: CRNA and Surgeon  Anesthesia Plan Comments: (Plan routine monitors, GETA Discussed periop intubation with family, who accepts, understands DNR suspension )        Anesthesia Quick Evaluation

## 2017-05-01 NOTE — Discharge Instructions (Signed)
-  Okay for full weightbearing as tolerated to right leg -Maintain postoperative dressing and was become saturated and then you may change with a clean dry dressing daily. -For prevention of blood clots take one 81 mg aspirin twice daily for 1 month. -Keep your incision clean and dry until your follow-up appointment. -Return to see your surgeon in 2 weeks for a wound check and staple removal.

## 2017-05-01 NOTE — H&P (Signed)
History and Physical    [name] [facility] JJK:[id number] DOB: [birth year] DOA: 05/01/2017   PCP: Orpah Melter, MD   Patient coming from:  Home    Chief Complaint: Right hip pain after fall  HPI: [name] is a 83 y.o. female with medical history significant for  IBS,anxiety/depression, Chronic dysphagia 3 (thin liquids and asp.precautions), Chronic pain due to arthritis, recent admission for pneumonia d/c 7/3, presenting to the ED after sleeping and falling in her bathroom. As she was falling down, she struck her right hip, complaining of moderate to severe pain in the area, worse with movement. She did hit her head, but did not had any bleeding issues or LOC. She had minor neck pain and right shoulder pain. She denies weakness in her arms and legs. She denies back pain. She was last seen normal the day prior. She denies any dizziness or vertigo. No seizures or confusion noted. She denies any nausea or vomiting. No abdominal pain. The patient has chronically poor appetite She denies any chest pain or increasing shortness of breath. She uses flutter at home. She denies any dysuria or gross hematuria. She has chronic constipation, last bowel movement yesterday. No tobacco, alcohol or recreational drug use.   ED Course:  BP 130/70   Pulse (!) 109   Temp 98 F (36.7 C) (Oral)   Resp 17   Ht 4\' 9"  (1.448 m)   Wt 49.4 kg (109 lb)   SpO2 95%   BMI 23.59 kg/m    glucose 106 creatinine 0.86 calcium 10.1 sodium 138, potassium 4.1 WBC 8.7 hemoglobin 8.9, with a history of normocytic anemia, chronic (BL 8-10). Stable. platelets 357 PT 15 and INR 1.17 CT of the C-spine negative for cervical spine fracture CT of the head negative for acute findings right hip x-ray intertrochanteric right hip fracture on transport, the patient was given fentanyl 200 g, and then at the ED received   Morphine 4 mg with some relief Ortho consultation pending   Review of Systems:  As per HPI otherwise  all other systems reviewed and are negative  Past Medical History:  Diagnosis Date  . Arthritis   . IBS (irritable bowel syndrome)   . Pneumonia     History reviewed. No pertinent surgical history.  Social History Social History   Social History  . Marital status: Widowed    Spouse name: N/A  . Number of children: N/A  . Years of education: N/A   Occupational History  . Not on file.   Social History Main Topics  . Smoking status: Never Smoker  . Smokeless tobacco: Never Used  . Alcohol use No  . Drug use: No  . Sexual activity: Not Currently   Other Topics Concern  . Not on file   Social History Narrative  . No narrative on file     Allergies  Allergen Reactions  . Cerefolin [L-Methylfolate-B12-B6-B2] Nausea Only  . Codeine Nausea And Vomiting and Other (See Comments)    dizziness  . Lactose Intolerance (Gi) Other (See Comments)    Constipation and bloating    History reviewed. No pertinent family history.    Prior to Admission medications   Medication Sig Start Date End Date Taking? Authorizing Provider  benzonatate (TESSALON) 100 MG capsule Take 100 mg by mouth 3 (three) times daily as needed for cough.  02/03/17  Yes [provider]  celecoxib (CELEBREX) 100 MG capsule Take 1 capsule (100 mg total) by mouth 2 (two) times daily.  04/07/17  Yes Mikhail, Velta Addison, DO  DULoxetine (CYMBALTA) 60 MG capsule Take 60 mg by mouth at bedtime.  12/22/16  Yes [provider]  feeding supplement (BOOST / RESOURCE BREEZE) LIQD Take 1 Container by mouth 3 (three) times daily between meals. 04/07/17  Yes Mikhail, Velta Addison, DO  HYDROcodone-acetaminophen (NORCO) 7.5-325 MG tablet Take 1 tablet by mouth every 4 (four) hours as needed (pain).  01/23/17  Yes [provider]  hydrocortisone 2.5 % cream Apply 1 application topically 2 (two) times daily as needed (rash).  11/06/16  Yes [provider]  pantoprazole (PROTONIX) 40 MG tablet Take 40 mg by mouth  daily. 01/19/17  Yes [provider]  polyethylene glycol (MIRALAX / GLYCOLAX) packet Take 34 g by mouth at bedtime. Mix 2 capfuls (34 grams) in 6-8 oz liquid and drink   Yes [provider]  rOPINIRole (REQUIP) 0.25 MG tablet Take 1 tablet (0.25 mg total) by mouth daily as needed (severe restless legs). 02/10/17  Yes Domenic Polite, MD  Tetrahydrozoline HCl (VISINE OP) Place 1 drop into both eyes daily as needed (dry eyes/irritation).   Yes [provider]  triazolam (HALCION) 0.25 MG tablet Take 0.25 mg by mouth at bedtime. 01/22/17  Yes [provider]  Respiratory Therapy Supplies (FLUTTER) DEVI Blow into device 3 times in a row twice daily. 04/17/17   Javier Glazier, MD    Physical Exam:  Vitals:   05/01/17 0531 05/01/17 0600 05/01/17 0630 05/01/17 0700  BP: 140/89 (!) 151/97 140/85 130/70  Pulse:      Resp: (!) 22 18 20 17   Temp:      TempSrc:      SpO2:      Weight:      Height:       Constitutional: NAD, calm, uncomfortable after fall. Cachectic Eyes: PERRL, lids and conjunctivae normal ENMT: Mucous membranes are moist, without exudate or lesions  Neck: normal, supple, no masses, no thyromegaly Respiratory: clear to auscultation bilaterally, no wheezing, no crackles. Normal respiratory effort  Cardiovascular: Regular rate and rhythm, 3/6 murmurs, rubs or gallops. No extremity edema. 2+ pedal pulses. No carotid bruits.  Abdomen: Soft, non tender, No hepatosplenomegaly. Bowel sounds positive.  Musculoskeletal:RLE external rotation , R hip painful to touch, pulses normal, other extremities without any other abnormalities Skin: no jaundice, No lesions.  Neurologic: Sensation intact  Strength equal in all extremities Psychiatric:   Alert and oriented x 3. Normal mood.     Labs on Admission: I have personally reviewed following labs and imaging studies  CBC:  Recent Labs Lab 05/01/17 0505  WBC 8.7  NEUTROABS 6.2  HGB 8.9*  HCT 27.5*    MCV 100.4*  PLT 347    Basic Metabolic Panel:  Recent Labs Lab 05/01/17 0505  NA 138  K 4.1  CL 101  CO2 27  GLUCOSE 106*  BUN 24*  CREATININE 0.86  CALCIUM 10.1    GFR: Estimated Creatinine Clearance: 32.4 mL/min (by C-G formula based on SCr of 0.86 mg/dL).  Liver Function Tests: No results for input(s): AST, ALT, ALKPHOS, BILITOT, PROT, ALBUMIN in the last 168 hours. No results for input(s): LIPASE, AMYLASE in the last 168 hours. No results for input(s): AMMONIA in the last 168 hours.  Coagulation Profile:  Recent Labs Lab 05/01/17 0505  INR 1.17    Cardiac Enzymes: No results for input(s): CKTOTAL, CKMB, CKMBINDEX, TROPONINI in the last 168 hours.  BNP (last 3 results) No results for input(s):  PROBNP in the last 8760 hours.  HbA1C: No results for input(s): HGBA1C in the last 72 hours.  CBG: No results for input(s): GLUCAP in the last 168 hours.  Lipid Profile: No results for input(s): CHOL, HDL, LDLCALC, TRIG, CHOLHDL, LDLDIRECT in the last 72 hours.  Thyroid Function Tests: No results for input(s): TSH, T4TOTAL, FREET4, T3FREE, THYROIDAB in the last 72 hours.  Anemia Panel: No results for input(s): VITAMINB12, FOLATE, FERRITIN, TIBC, IRON, RETICCTPCT in the last 72 hours.  Urine analysis: No results found for: COLORURINE, APPEARANCEUR, LABSPEC, PHURINE, GLUCOSEU, HGBUR, BILIRUBINUR, KETONESUR, PROTEINUR, UROBILINOGEN, NITRITE, LEUKOCYTESUR  Sepsis Labs: @LABRCNTIP (procalcitonin:4,lacticidven:4) )No results found for this or any previous visit (from the past 240 hour(s)).   Radiological Exams on Admission: Dg Chest 1 View  Result Date: 05/01/2017 CLINICAL DATA:  Pain after falling 1 day ago EXAM: CHEST 1 VIEW COMPARISON:  03/03/2017 FINDINGS: Diffuse interstitial prominence, likely interstitial edema. Perihilar airspace opacities likely represent alveolar edema. Small pleural effusions, right greater than left. Moderate cardiomegaly. IMPRESSION:  The findings likely represent congestive heart failure with interstitial and alveolar edema. Electronically Signed   By: Andreas Newport M.D.   On: 05/01/2017 05:27   Ct Head Wo Contrast  Result Date: 05/01/2017 CLINICAL DATA:  Headache.  Fell during the night. EXAM: CT HEAD WITHOUT CONTRAST CT CERVICAL SPINE WITHOUT CONTRAST TECHNIQUE: Multidetector CT imaging of the head and cervical spine was performed following the standard protocol without intravenous contrast. Multiplanar CT image reconstructions of the cervical spine were also generated. COMPARISON:  12/03/2005 FINDINGS: CT HEAD FINDINGS Brain: There is no intracranial hemorrhage, mass or evidence of acute infarction. There is mild generalized atrophy. There is mild chronic microvascular ischemic change. There is no significant extra-axial fluid collection. No acute intracranial findings are evident. Vascular: No hyperdense vessel or unexpected calcification. Skull: Normal. Negative for fracture or focal lesion. Sinuses/Orbits: No acute finding. Other: None. CT CERVICAL SPINE FINDINGS Alignment: Normal. Skull base and vertebrae: No acute fracture. No primary bone lesion or focal pathologic process. Soft tissues and spinal canal: No prevertebral fluid or swelling. No visible canal hematoma. Disc levels: Moderately severe degenerative cervical disc disease at C5-6 and C6-7. Prominent facet arthritis, greatest at C4-5. Grade 1 degenerative spondylolisthesis at C4-5. Upper chest: Bilateral pleural effusions. Other: None IMPRESSION: 1. No acute intracranial findings. There is mild generalized atrophy and chronic appearing white matter hypodensities which likely represent small vessel ischemic disease. 2. Negative for acute cervical spine fracture. 3. Bilateral pleural effusions. Electronically Signed   By: Andreas Newport M.D.   On: 05/01/2017 05:49   Ct Cervical Spine Wo Contrast  Result Date: 05/01/2017 CLINICAL DATA:  Headache.  Fell during the  night. EXAM: CT HEAD WITHOUT CONTRAST CT CERVICAL SPINE WITHOUT CONTRAST TECHNIQUE: Multidetector CT imaging of the head and cervical spine was performed following the standard protocol without intravenous contrast. Multiplanar CT image reconstructions of the cervical spine were also generated. COMPARISON:  12/03/2005 FINDINGS: CT HEAD FINDINGS Brain: There is no intracranial hemorrhage, mass or evidence of acute infarction. There is mild generalized atrophy. There is mild chronic microvascular ischemic change. There is no significant extra-axial fluid collection. No acute intracranial findings are evident. Vascular: No hyperdense vessel or unexpected calcification. Skull: Normal. Negative for fracture or focal lesion. Sinuses/Orbits: No acute finding. Other: None. CT CERVICAL SPINE FINDINGS Alignment: Normal. Skull base and vertebrae: No acute fracture. No primary bone lesion or focal pathologic process. Soft tissues and spinal canal: No prevertebral fluid or swelling. No visible  canal hematoma. Disc levels: Moderately severe degenerative cervical disc disease at C5-6 and C6-7. Prominent facet arthritis, greatest at C4-5. Grade 1 degenerative spondylolisthesis at C4-5. Upper chest: Bilateral pleural effusions. Other: None IMPRESSION: 1. No acute intracranial findings. There is mild generalized atrophy and chronic appearing white matter hypodensities which likely represent small vessel ischemic disease. 2. Negative for acute cervical spine fracture. 3. Bilateral pleural effusions. Electronically Signed   By: Andreas Newport M.D.   On: 05/01/2017 05:49   Dg Humerus Right  Result Date: 05/01/2017 CLINICAL DATA:  Right upper extremity pain after falling 1 day ago EXAM: RIGHT HUMERUS - 2+ VIEW COMPARISON:  None. FINDINGS: Negative for acute fracture. Severe chronic right shoulder arthropathy. No discrete bone lesion. No acute soft tissue abnormality. IMPRESSION: Negative for acute fracture or dislocation. Severe  chronic right shoulder arthropathy. Electronically Signed   By: Andreas Newport M.D.   On: 05/01/2017 05:26   Dg Hip Unilat  With Pelvis 2-3 Views Right  Result Date: 05/01/2017 CLINICAL DATA:  Right hip pain for 1 day after falling EXAM: DG HIP (WITH OR WITHOUT PELVIS) 2-3V RIGHT COMPARISON:  None. FINDINGS: There is an intertrochanteric right hip fracture with moderate comminution and mild varus angulation. No dislocation. No radiographic evidence of a pathologic basis for the fracture. Bony pelvis is intact. Pubic symphysis and sacroiliac joints appear intact. IMPRESSION: Intertrochanteric right hip fracture Electronically Signed   By: Andreas Newport M.D.   On: 05/01/2017 04:31    EKG: Independently reviewed.  Assessment/Plan Active Problems:   Hip fracture (HCC)   Dysphagia   Osteoarthritis   IBS (irritable bowel syndrome)   GERD (gastroesophageal reflux disease)   Consolidation lung (HCC) left lower lobe   Loss of weight   Pleural effusion   Protein-calorie malnutrition, severe   Bronchiectasis without complication (HCC)   Depression   Closed displaced intertrochanteric R femoral Fracture in a patient with a history of arthritis NAD. CBC and CMET unremarkable . Received  IV pain meds, immobilized. CXR NAD.   Admit to med surg, anticipating surgery as per Ortho today  ASAHip/Femur fracture Preop protocol per order set  NPO for now  SCDs IVF at 75 cc/h (Gr 1 DD and known pleural effusion)  Pain control with Morphine prn and Norco PT/OT consult Care management consult for possible rehab as IP  Continue Celebrex  Falls precautions  Depression  Continue home Cymbalta and Halcion  GERD/ Dysphagia 3. Severe protein malnutrition . Esophagram 5/1 with esophageal dysmotility, no masses. Weight 109 lbs. Alb 2.7 Continue PPI Soft diet in am   Check PAB   Nutrition consult  Anemia of chronic disease hemoglobin 8.9, with a history of normocytic anemia, chronic (BL 8-10).  Stable. Repeat CBC in am  No transfusion is indicated at this time Continue B12 as OP   Restless leg syndrome Continue Requip in am    Known LLL mass vs. Consolidation and B effusions, seen by Pulmonary on 7/13 To f/u as outpatient with Pulmonology Respiratory workup is ongoing  Continue Mucinex and prn cough med  CXR with same findings, no acute changes . Repeat in am    DVT prophylaxis  SCD, for surgery today   Code Status:   DNR Family Communication:  Discussed with patient Disposition Plan: Expect patient to be discharged to home after condition improves Consults called:  Ortho per EDP   Admission status: Inpatient    [facility] E, PA-C [facility]   05/01/2017, 7:22 AM

## 2017-05-01 NOTE — Progress Notes (Signed)
Initial Nutrition Assessment  DOCUMENTATION CODES:   Not applicable  INTERVENTION:  Once diet advances, provide Boost Breeze po TID, each supplement provides 250 kcal and 9 grams of protein.  NUTRITION DIAGNOSIS:   Increased nutrient needs related to  (post op healing) as evidenced by estimated needs.  GOAL:   Patient will meet greater than or equal to 90% of their needs  MONITOR:   Supplement acceptance, Labs, Weight trends, Skin, I & O's, Diet advancement  REASON FOR ASSESSMENT:   Consult Assessment of nutrition requirement/status  ASSESSMENT:   83 y.o. female with medical history significant for  IBS,anxiety/depression, Chronic dysphagia 3 (thin liquids and asp.precautions), Chronic pain due to arthritis, recent admission for pneumonia d/c 7/3, presenting to the ED after sleeping and falling in her bathroom. Pt with Closed displaced intertrochanteric R femoral Fracture  PROCEDURE (7/27): Treatment of intertrochanteric, pertrochanteric, subtrochanteric fracture with intramedullary implant.   Pt is OR currently. RD unable to obtain nutrition history or perform the nutrition focused physical exam. Per weight records, weight has been stable. Pt currently has Boost Breeze ordered. Recommend continuation of Boost Breeze once diet advances. Labs and medications reviewed.   Diet Order:  Diet NPO time specified Except for: Sips with Meds, Ice Chips  Skin:   (Incision R hip)  Last BM:  7/27  Height:   Ht Readings from Last 1 Encounters:  05/01/17 4\' 9"  (1.448 m)    Weight:   Wt Readings from Last 1 Encounters:  05/01/17 109 lb (49.4 kg)    Ideal Body Weight:  43 kg  BMI:  Body mass index is 23.59 kg/m.  Estimated Nutritional Needs:   Kcal:  1500-1750  Protein:  65-75 grams  Fluid:  1.5 - 1.7 L/day  EDUCATION NEEDS:   No education needs identified at this time  Corrin Parker, MS, RD, LDN Pager # [phone] After hours/ weekend pager # [phone]

## 2017-05-01 NOTE — ED Triage Notes (Addendum)
Per EMS, pt had a mechanical fall at home. No LOC, unknown cause of fall. Pt has shortening rotation to rt hip after fall. Pt was c/o pain on rt hip, EMS gave 230mcg of Fentanyl. Sensation and pulses intact. Pt A&Ox4. EMS VS 126/70 and HR 90. Pt currently denies pain and has her legs crossed.

## 2017-05-01 NOTE — Transfer of Care (Signed)
Immediate Anesthesia Transfer of Care Note  Patient: [name] - [place]  Procedure(s) Performed: Procedure(s): INTRAMEDULLARY (IM) NAIL INTERTROCHANTRIC (Right)  Patient Location: PACU  Anesthesia Type:General  Level of Consciousness: awake, oriented, drowsy and patient cooperative  Airway & Oxygen Therapy: Patient Spontanous Breathing and Patient connected to nasal cannula oxygen  Post-op Assessment: Report given to RN and Post -op Vital signs reviewed and stable  Post vital signs: Reviewed  Last Vitals:  Vitals:   05/01/17 0838 05/01/17 1450  BP: 128/60 137/90  Pulse:  (!) 101  Resp: 16 11  Temp: 36.7 C     Last Pain:  Vitals:   05/01/17 1142  TempSrc:   PainSc: 2          Complications: No apparent anesthesia complications

## 2017-05-01 NOTE — Care Management Note (Signed)
Case Management Note  Patient Details  Name: WONDA GOODGAME MRN: [id number] Date of Birth: [birth year]  Subjective/Objective:     Right hip fx, IM nailing               Action/Plan: Discharge Planning: NCM spoke to dtr, Milta Deiters [phone] and she is requesting SNF rehab. States pt has bought into the [place] community. Explained to dtr to contact Coordinator to follow up on SNF -rehab availability. Explained NCM will do a referral for SNF-rehab and if bed not available at [facility], they will work on getting her placed a different SNF. She verbalized understanding. Provided dtr with [facility] contact number. States she will call. CSW referral for SNF.    Expected Discharge Date:                  Expected Discharge Plan:  Skilled Nursing Facility  In-House Referral:  Clinical Social Work  Discharge planning Services  CM Consult  Post Acute Care Choice:  NA Choice offered to:  NA  DME Arranged:  N/A DME Agency:  NA  HH Arranged:  NA HH Agency:  NA  Status of Service:  Completed, signed off  If discussed at [place] of Stay Meetings, dates discussed:    Additional Comments:  Erenest Rasher, RN 05/01/2017, 5:30 PM

## 2017-05-01 NOTE — Consult Note (Signed)
ORTHOPAEDIC CONSULTATION  REQUESTING PHYSICIAN: Rise Patience, MD  PCP:  Orpah Melter, MD  Chief Complaint: Right hip pain following a fall  HPI: [name] is a 83 y.o. female who complains of right hip pain and deformity following a fall last night. She is a patient well-known to our practice for having had prior operations performed by Dr. Theda Sers. She lives with her son and does require the use of a walker at baseline. She is otherwise independent with her ADLs but does have the help of a nurse's aide at the house during the daytime. She states that she has been not as compliant with a walker lately under the encouragement of her therapist. Currently she states her pain can be as high as a 5 out of 10 but at rest just 2 or 3 out of 10. She denies any numbness tingling or paralysis of the right leg. No antecedent right hip pain.  Past Medical History:  Diagnosis Date  . Arthritis   . IBS (irritable bowel syndrome)   . Pneumonia    History reviewed. No pertinent surgical history. Social History   Social History  . Marital status: Widowed    Spouse name: N/A  . Number of children: N/A  . Years of education: N/A   Social History Main Topics  . Smoking status: Never Smoker  . Smokeless tobacco: Never Used  . Alcohol use No  . Drug use: No  . Sexual activity: Not Currently   Other Topics Concern  . None   Social History Narrative  . None   History reviewed. No pertinent family history. Allergies  Allergen Reactions  . Cerefolin [L-Methylfolate-B12-B6-B2] Nausea Only  . Codeine Nausea And Vomiting and Other (See Comments)    dizziness  . Lactose Intolerance (Gi) Other (See Comments)    Constipation and bloating   Prior to Admission medications   Medication Sig Start Date End Date Taking? Authorizing Provider  benzonatate (TESSALON) 100 MG capsule Take 100 mg by mouth 3 (three) times daily as needed for cough.  02/03/17  Yes [provider]    celecoxib (CELEBREX) 100 MG capsule Take 1 capsule (100 mg total) by mouth 2 (two) times daily. 04/07/17  Yes Mikhail, Velta Addison, DO  DULoxetine (CYMBALTA) 60 MG capsule Take 60 mg by mouth at bedtime.  12/22/16  Yes [provider]  feeding supplement (BOOST / RESOURCE BREEZE) LIQD Take 1 Container by mouth 3 (three) times daily between meals. 04/07/17  Yes Mikhail, Velta Addison, DO  HYDROcodone-acetaminophen (NORCO) 7.5-325 MG tablet Take 1 tablet by mouth every 4 (four) hours as needed (pain).  01/23/17  Yes [provider]  hydrocortisone 2.5 % cream Apply 1 application topically 2 (two) times daily as needed (rash).  11/06/16  Yes [provider]  pantoprazole (PROTONIX) 40 MG tablet Take 40 mg by mouth daily. 01/19/17  Yes [provider]  polyethylene glycol (MIRALAX / GLYCOLAX) packet Take 34 g by mouth at bedtime. Mix 2 capfuls (34 grams) in 6-8 oz liquid and drink   Yes [provider]  rOPINIRole (REQUIP) 0.25 MG tablet Take 1 tablet (0.25 mg total) by mouth daily as needed (severe restless legs). 02/10/17  Yes Domenic Polite, MD  Tetrahydrozoline HCl (VISINE OP) Place 1 drop into both eyes daily as needed (dry eyes/irritation).   Yes [provider]  triazolam (HALCION) 0.25 MG tablet Take 0.25 mg by mouth at bedtime. 01/22/17  Yes [provider]  Respiratory Therapy Supplies (FLUTTER)  DEVI Blow into device 3 times in a row twice daily. 04/17/17   Javier Glazier, MD   Dg Chest 1 View  Result Date: 05/01/2017 CLINICAL DATA:  Pain after falling 1 day ago EXAM: CHEST 1 VIEW COMPARISON:  03/03/2017 FINDINGS: Diffuse interstitial prominence, likely interstitial edema. Perihilar airspace opacities likely represent alveolar edema. Small pleural effusions, right greater than left. Moderate cardiomegaly. IMPRESSION: The findings likely represent congestive heart failure with interstitial and alveolar edema. Electronically Signed   By: Andreas Newport M.D.   On: 05/01/2017 05:27   Ct Head Wo Contrast  Result Date: 05/01/2017 CLINICAL DATA:  Headache.  Fell during the night. EXAM: CT HEAD WITHOUT CONTRAST CT CERVICAL SPINE WITHOUT CONTRAST TECHNIQUE: Multidetector CT imaging of the head and cervical spine was performed following the standard protocol without intravenous contrast. Multiplanar CT image reconstructions of the cervical spine were also generated. COMPARISON:  12/03/2005 FINDINGS: CT HEAD FINDINGS Brain: There is no intracranial hemorrhage, mass or evidence of acute infarction. There is mild generalized atrophy. There is mild chronic microvascular ischemic change. There is no significant extra-axial fluid collection. No acute intracranial findings are evident. Vascular: No hyperdense vessel or unexpected calcification. Skull: Normal. Negative for fracture or focal lesion. Sinuses/Orbits: No acute finding. Other: None. CT CERVICAL SPINE FINDINGS Alignment: Normal. Skull base and vertebrae: No acute fracture. No primary bone lesion or focal pathologic process. Soft tissues and spinal canal: No prevertebral fluid or swelling. No visible canal hematoma. Disc levels: Moderately severe degenerative cervical disc disease at C5-6 and C6-7. Prominent facet arthritis, greatest at C4-5. Grade 1 degenerative spondylolisthesis at C4-5. Upper chest: Bilateral pleural effusions. Other: None IMPRESSION: 1. No acute intracranial findings. There is mild generalized atrophy and chronic appearing white matter hypodensities which likely represent small vessel ischemic disease. 2. Negative for acute cervical spine fracture. 3. Bilateral pleural effusions. Electronically Signed   By: Andreas Newport M.D.   On: 05/01/2017 05:49   Ct Cervical Spine Wo Contrast  Result Date: 05/01/2017 CLINICAL DATA:  Headache.  Fell during the night. EXAM: CT HEAD WITHOUT CONTRAST CT CERVICAL SPINE WITHOUT CONTRAST TECHNIQUE: Multidetector CT imaging of the head and cervical  spine was performed following the standard protocol without intravenous contrast. Multiplanar CT image reconstructions of the cervical spine were also generated. COMPARISON:  12/03/2005 FINDINGS: CT HEAD FINDINGS Brain: There is no intracranial hemorrhage, mass or evidence of acute infarction. There is mild generalized atrophy. There is mild chronic microvascular ischemic change. There is no significant extra-axial fluid collection. No acute intracranial findings are evident. Vascular: No hyperdense vessel or unexpected calcification. Skull: Normal. Negative for fracture or focal lesion. Sinuses/Orbits: No acute finding. Other: None. CT CERVICAL SPINE FINDINGS Alignment: Normal. Skull base and vertebrae: No acute fracture. No primary bone lesion or focal pathologic process. Soft tissues and spinal canal: No prevertebral fluid or swelling. No visible canal hematoma. Disc levels: Moderately severe degenerative cervical disc disease at C5-6 and C6-7. Prominent facet arthritis, greatest at C4-5. Grade 1 degenerative spondylolisthesis at C4-5. Upper chest: Bilateral pleural effusions. Other: None IMPRESSION: 1. No acute intracranial findings. There is mild generalized atrophy and chronic appearing white matter hypodensities which likely represent small vessel ischemic disease. 2. Negative for acute cervical spine fracture. 3. Bilateral pleural effusions. Electronically Signed   By: Andreas Newport M.D.   On: 05/01/2017 05:49   Dg Humerus Right  Result Date: 05/01/2017 CLINICAL DATA:  Right upper extremity pain after falling 1 day ago EXAM: RIGHT HUMERUS -  2+ VIEW COMPARISON:  None. FINDINGS: Negative for acute fracture. Severe chronic right shoulder arthropathy. No discrete bone lesion. No acute soft tissue abnormality. IMPRESSION: Negative for acute fracture or dislocation. Severe chronic right shoulder arthropathy. Electronically Signed   By: Andreas Newport M.D.   On: 05/01/2017 05:26   Dg Hip Unilat  With  Pelvis 2-3 Views Right  Result Date: 05/01/2017 CLINICAL DATA:  Right hip pain for 1 day after falling EXAM: DG HIP (WITH OR WITHOUT PELVIS) 2-3V RIGHT COMPARISON:  None. FINDINGS: There is an intertrochanteric right hip fracture with moderate comminution and mild varus angulation. No dislocation. No radiographic evidence of a pathologic basis for the fracture. Bony pelvis is intact. Pubic symphysis and sacroiliac joints appear intact. IMPRESSION: Intertrochanteric right hip fracture Electronically Signed   By: Andreas Newport M.D.   On: 05/01/2017 04:31    Positive ROS: All other systems have been reviewed and were otherwise negative with the exception of those mentioned in the HPI and as above.  Physical Exam: General: Alert, no acute distress Cardiovascular: No pedal edema Respiratory: No cyanosis, no use of accessory musculature GI: No organomegaly, abdomen is soft and non-tender Skin: No lesions in the area of chief complaint Neurologic: Sensation intact distally Psychiatric: Patient is competent for consent with normal mood and affect Lymphatic: No axillary or cervical lymphadenopathy  MUSCULOSKELETAL:   Right lower extremity- the leg is held in an externally rotated position and is somewhat shortened. She's tender to palpation along the proximal thigh and hip. Distally she endorses sensation intact to light touch in the deep and superficial peroneal nerves as well as sural and saphenous nerves. Intact as well as the tibial nerve. Her motor is intact with tibialis anterior/gastrocsoleus/flexor hallucis longus/extensor hallucis longus. 2+ dorsalis pedis pulse.  Assessment: 1. Right hip intertrochanteric closed fracture.  Plan: -I discussed with her children at the bedside my recommendation would be for operative intervention of this right hip fracture. This will be accomplished utilizing an intramedullary nail. -We will tentatively plan for that surgery to take place this afternoon  pending or availability. Certainly there is a chance we may have to be delayed until tomorrow morning. -Questions were encouraged and answered to their satisfaction. -The risks, benefits, and alternatives were discussed with the patient. There are risks associated with the surgery including, but not limited to, problems with anesthesia (death), infection, differences in leg length/angulation/rotation, fracture of bones, loosening or failure of implants, malunion, nonunion, hematoma (blood accumulation) which may require surgical drainage, blood clots, pulmonary embolism, nerve injury (foot drop), and blood vessel injury. The patient understands these risks and elects to proceed. - Please keep nothing by mouth and hold any chemical DVT prophylaxis at this time.    Nicholes Stairs, MD Cell [phone]    05/01/2017 7:31 AM

## 2017-05-01 NOTE — Op Note (Signed)
Date of Surgery: 05/01/2017  INDICATIONS: [name] is a 83 y.o.-year-old female who sustained a right hip fracture. She lives with her son at baseline and is independent with all ADLs. She does require the aid of an anterior walker at baseline however was not using that at the time of her fall. Sustained a right intertrochanteric hip fracture late last evening. She was indicated for operative intervention. The risks and benefits of the procedure discussed with the patient prior to the procedure and all questions were answered; consent was obtained.  We discussed specifically the risk of bleeding, infection, damage to neurovascular structures, malunion, nonunion, hardware fair, persistent pain, need for future surgery, blood clots and risk of anesthesia.  PREOPERATIVE DIAGNOSIS: right hip fracture   POSTOPERATIVE DIAGNOSIS: Same   PROCEDURE: Treatment of intertrochanteric, pertrochanteric, subtrochanteric fracture with intramedullary implant. CPT [phone]   SURGEON: Dannielle Karvonen. Stann Mainland, M.D.   ANESTHESIA: general   IV FLUIDS AND URINE: See anesthesia record   ESTIMATED BLOOD LOSS: See anesthesia record  IMPLANTS:  Synthes TFN A 10 mm x 285 mm 100 mm compression screw 30 mm distal interlock screw  DRAINS: None.   COMPLICATIONS: None.   DESCRIPTION OF PROCEDURE: The patient was brought to the operating room and placed supine on the operating table. The patient's leg had been signed prior to the procedure. The patient had the anesthesia placed by the anesthesiologist. The prep verification and incision time-outs were performed to confirm that this was the correct patient, site, side and location. The patient had an SCD on the opposite lower extremity. The patient did receive antibiotics prior to the incision and was re-dosed during the procedure as needed at indicated intervals. The patient was positioned on the fracture table with the table in traction and internal rotation to reduce the  hip. The well leg was placed in a scissor position and all bony prominences were well-padded. The patient had the lower extremity prepped and draped in the standard surgical fashion. The incision was made 4 finger breadths superior to the greater trochanter. A guide pin was inserted into the tip of the greater trochanter under fluoroscopic guidance. An opening reamer was used to gain access to the femoral canal. The canal was then sequentially reamed up to a 12 mm reamer to accept a 10 mm diameter nail. The nail length was measured and inserted down the femoral canal to its proper depth. The appropriate version of insertion for the lag screw was found under fluoroscopy. A pin was inserted up the femoral neck through the jig. This position in the femoral neck was confirmed on both AP and lateral fluoroscopy. The length of the lag screw was then measured. The lag screw was inserted as near to center-center in the head as possible. The leg was taken out of traction, and a  compression screw was used to compress across the fracture. Compression was visualized on serial xrays.  Next turned our attention to the distal interlocking screw. This was placed through the appropriate slot on the jig for the nail. Incised the skin directly over the lateral cortex. Dissection was carried down through subcutaneous tissue and the iliotibial band. Blunt dissection carried to the lateral femoral cortex. Joint through the guide sleeve we drilled across both lateral medial cortex and through the nail. Length was measured off of the drill bit. Appropriate length screw was inserted under hand power.    The wound was copiously irrigated with saline and the subcutaneous layer closed with 2.0  vicryl and the skin was reapproximated with staples. The wounds were cleaned and dried a final time and a sterile dressing was placed. The hip was taken through a range of motion at the end of the case under fluoroscopic imaging to visualize the  approach-withdraw phenomenon and confirm implant length in the head. The patient was then awakened from anesthesia and taken to the recovery room in stable condition. All counts were correct at the end of the case.   POSTOPERATIVE PLAN: The patient will be weight bearing as tolerated and will return in 2 weeks for staple removal and the patient will receive DVT prophylaxis based on other medications, activity level, and risk ratio of bleeding to thrombosis. she will receive Lovenox once daily for DVT prophylaxis while in house and then to be discharged home on twice a day 81 mg aspirin for 1 month.      [name], [place] [phone] 2:41 PM

## 2017-05-01 NOTE — ED Notes (Signed)
Admitting MD at bedside.

## 2017-05-02 ENCOUNTER — Inpatient Hospital Stay (HOSPITAL_COMMUNITY): Payer: Medicare Other

## 2017-05-02 DIAGNOSIS — R1310 Dysphagia, unspecified: Secondary | ICD-10-CM

## 2017-05-02 DIAGNOSIS — J181 Lobar pneumonia, unspecified organism: Secondary | ICD-10-CM

## 2017-05-02 DIAGNOSIS — S72141A Displaced intertrochanteric fracture of right femur, initial encounter for closed fracture: Principal | ICD-10-CM

## 2017-05-02 LAB — BASIC METABOLIC PANEL
ANION GAP: 8 (ref 5–15)
BUN: 24 mg/dL — ABNORMAL HIGH (ref 6–20)
CALCIUM: 9.1 mg/dL (ref 8.9–10.3)
CO2: 25 mmol/L (ref 22–32)
Chloride: 101 mmol/L (ref 101–111)
Creatinine, Ser: 0.83 mg/dL (ref 0.44–1.00)
GFR calc non Af Amer: >60 mL/min (ref >60)
Glucose, Bld: 128 mg/dL — ABNORMAL HIGH (ref 65–99)
POTASSIUM: 4.6 mmol/L (ref 3.5–5.1)
Sodium: 134 mmol/L — ABNORMAL LOW (ref 135–145)

## 2017-05-02 LAB — URINALYSIS, ROUTINE W REFLEX MICROSCOPIC
BILIRUBIN URINE: NEGATIVE
GLUCOSE, UA: NEGATIVE mg/dL
Hgb urine dipstick: NEGATIVE
KETONES UR: NEGATIVE mg/dL
LEUKOCYTES UA: NEGATIVE
Nitrite: NEGATIVE
PH: 5 (ref 5.0–8.0)
Protein, ur: NEGATIVE mg/dL
Specific Gravity, Urine: 1.018 (ref 1.005–1.030)

## 2017-05-02 LAB — CBC
HEMATOCRIT: 24.4 % — AB (ref 36.0–46.0)
HEMOGLOBIN: 8.1 g/dL — AB (ref 12.0–15.0)
MCH: 31.8 pg (ref 26.0–34.0)
MCHC: 33.2 g/dL (ref 30.0–36.0)
MCV: 95.7 fL (ref 78.0–100.0)
Platelets: 272 10*3/uL (ref 150–400)
RBC: 2.55 MIL/uL — AB (ref 3.87–5.11)
RDW: 15.9 % — AB (ref 11.5–15.5)
WBC: 8 10*3/uL (ref 4.0–10.5)

## 2017-05-02 NOTE — Progress Notes (Signed)
SLP Cancellation Note  Patient Details Name: [name] MRN: [id number] DOB: 11-15-1929   Cancelled treatment:       Reason Eval/Treat Not Completed: SLP screened, no needs identified, will sign off. SLP spoke with patient's RN, reviewed chart and spoke with patient. Patient has a h/o dysphagia and after MBS during previous hospitalization, decision was made by patient and family to continue with regular, thin liquid diet knowing the risks of aspiration and aspiration pna. As patient's current hospitalization has not resulted in changes in her swallow function, and patient still wishes to continue self-managing her diet (eating softer solids and completing oral care, etc), BSE not completed at this time. SLP reiterated aspiration risks and patient continues with wishes to eat and drink, knowing the risks.    Sonia Baller, MA, CCC-SLP 05/02/17 12:31 PM

## 2017-05-02 NOTE — Evaluation (Signed)
Physical Therapy Evaluation Patient Details Name: [name] MRN: [id number] DOB: Feb 11, 1930 Today's Date: 05/02/2017   History of Present Illness  Pt is an 83 yo female admitted through ED on 05/01/17 following a fall resulting in a R hip fx. Pt underwent an IM nail on 05/01/17. PMH significant for IBS, anxiety/depression, chronic dysphagia, chronic Pain, penumonia 04/07/17.   Clinical Impression  Pt is Pod 1 following the above procedure. Prior to admission, pt lived with her son in a single level home and received assistance from paid caregivers 6 hrs a day and her son PRN. Pt requires assistance for bathing, dressing and got around her home with a 4-wheeled walker. Pt requires Mod A for bed mobs and Min A for transfers and gait this session. Pt would like to return home and can increase her time with paid caregivers in order to return home vs going to rehab. Pt will benefit from a WC upon discharge in order to improve mobility inside and outside home. Pt continued to require acute PT follow-up in order to address the below deficits prior to discharge.     Follow Up Recommendations Home health PT;Supervision/Assistance - 24 hour    Equipment Recommendations  Wheelchair (measurements PT);Wheelchair cushion (measurements PT)    Recommendations for Other Services       Precautions / Restrictions Precautions Precautions: Fall Restrictions Weight Bearing Restrictions: Yes RLE Weight Bearing: Weight bearing as tolerated      Mobility  Bed Mobility Overal bed mobility: Needs Assistance Bed Mobility: Supine to Sit     Supine to sit: Mod assist     General bed mobility comments: Mod A to EOB with assistance for trunk and LE's  Transfers Overall transfer level: Needs assistance Equipment used: Rolling walker (2 wheeled) Transfers: Sit to/from Stand Sit to Stand: Min assist         General transfer comment: Min A to stand from EOB to RW  Ambulation/Gait Ambulation/Gait  assistance: Min assist Ambulation Distance (Feet): 20 Feet Assistive device: Rolling walker (2 wheeled) Gait Pattern/deviations: Step-to pattern;Decreased step length - left;Decreased stance time - right;Antalgic Gait velocity: decreased Gait velocity interpretation: Below normal speed for age/gender General Gait Details: Moderate antaltgic gait with RW. Cues for sequencing and to use UE's as much as possible due to increased pain in bilateral shoulders and severe OA.   Stairs            Wheelchair Mobility    Modified Rankin (Stroke Patients Only)       Balance Overall balance assessment: Needs assistance Sitting-balance support: Single extremity supported;Feet supported Sitting balance-Leahy Scale: Fair     Standing balance support: Bilateral upper extremity supported Standing balance-Leahy Scale: Poor Standing balance comment: reliant on RW for stability in standing                             Pertinent Vitals/Pain Pain Assessment: 0-10 Pain Score: 8  Pain Location: R hip with mobility Pain Descriptors / Indicators: Aching;Grimacing;Guarding;Sore Pain Intervention(s): Monitored during session;Premedicated before session;Repositioned    Home Living Family/patient expects to be discharged to:: Private residence Living Arrangements: Children (son) Available Help at Discharge: Family;Available PRN/intermittently;Personal care attendant (has [employer] keep[employer] from 9-2. Alone 4-5 hrs a day) Type of Home: House Home Access: Stairs to enter Entrance Stairs-Rails: Left;Right;Can reach both Entrance Stairs-Number of Steps: 4 Home Layout: Two level;Able to live on main level with bedroom/bathroom Home Equipment: Bedside commode;Shower seat;Walker - 4  wheels;Walker - 2 wheels      Prior Function Level of Independence: Needs assistance   Gait / Transfers Assistance Needed: Uses rollator or household ambulatory after admission in May. Reports falls.  ADL's /  Homemaking Assistance Needed: Needs assist with ADLs- has a caregiver come in 9-2 daily to assist with bathing, dressing, IADLs. Family helps with meals.  Comments: hasn't had any falls since last admission     Hand Dominance   Dominant Hand: Right    Extremity/Trunk Assessment   Upper Extremity Assessment Upper Extremity Assessment: Generalized weakness    Lower Extremity Assessment Lower Extremity Assessment: RLE deficits/detail RLE Deficits / Details: pt with post op pain and weakness. At least 3/5 grossly RLE RLE: Unable to fully assess due to pain    Cervical / Trunk Assessment Cervical / Trunk Assessment: Kyphotic  Communication   Communication: HOH  Cognition Arousal/Alertness: Awake/alert Behavior During Therapy: WFL for tasks assessed/performed Overall Cognitive Status: Within Functional Limits for tasks assessed                                        General Comments General comments (skin integrity, edema, etc.): Pt has personal care services 6 hrs a day, but can increase her time when she returns home.     Exercises     Assessment/Plan    PT Assessment Patient needs continued PT services  PT Problem List Decreased strength;Decreased range of motion;Decreased activity tolerance;Decreased balance;Decreased mobility;Pain       PT Treatment Interventions DME instruction;Gait training;Stair training;Functional mobility training;Therapeutic activities;Therapeutic exercise;Balance training;Patient/family education    PT Goals (Current goals can be found in the Care Plan section)  Acute Rehab PT Goals Patient Stated Goal: to return home PT Goal Formulation: With patient Time For Goal Achievement: 05/09/17 Potential to Achieve Goals: Good    Frequency Min 5X/week   Barriers to discharge        Co-evaluation               AM-PAC PT "6 Clicks" Daily Activity  Outcome Measure Difficulty turning over in bed (including adjusting  bedclothes, sheets and blankets)?: Total Difficulty moving from lying on back to sitting on the side of the bed? : Total Difficulty sitting down on and standing up from a chair with arms (e.g., wheelchair, bedside commode, etc,.)?: Total Help needed moving to and from a bed to chair (including a wheelchair)?: A Lot Help needed walking in hospital room?: A Lot Help needed climbing 3-5 steps with a railing? : Total 6 Click Score: 8    End of Session Equipment Utilized During Treatment: Gait belt Activity Tolerance: Patient tolerated treatment well Patient left: in chair;with call bell/phone within reach Nurse Communication: Mobility status PT Visit Diagnosis: Unsteadiness on feet (R26.81);Muscle weakness (generalized) (M62.81);Pain;Difficulty in walking, not elsewhere classified (R26.2) Pain - Right/Left: Right Pain - part of body: Hip    Time: 0076-2263 PT Time Calculation (min) (ACUTE ONLY): 33 min   Charges:   PT Evaluation $PT Eval Moderate Complexity: 1 Procedure PT Treatments $Therapeutic Activity: 8-22 mins   PT G Codes:        Scheryl Marten PT, DPT  [phone]   [name] 05/02/2017, 12:55 PM

## 2017-05-02 NOTE — Progress Notes (Signed)
Subjective: 1 Day Post-Op Procedure(s) (LRB): INTRAMEDULLARY (IM) NAIL INTERTROCHANTRIC (Right) Patient reports pain as 3 on 0-10 scale.  She notes soreness but was able to sleep throughout the night last night.  She is tolerating her diet without any difficulty. She has passed flatus.  She is WBAT for the RLE and is doing well.  She denies chest pain, shortness of breath, nausea, vomiting, diarrhea, fever, or chills.  Objective: Vital signs in last 24 hours: Temp:  [97.5 F (36.4 C)-98.7 F (37.1 C)] 98.7 F (37.1 C) (07/27 2015) Pulse Rate:  [87-101] 90 (07/27 2015) Resp:  [10-27] 18 (07/27 2015) BP: (100-139)/(56-90) 100/56 (07/27 2015) SpO2:  [95 %-100 %] 97 % (07/27 2015)  Intake/Output from previous day: 07/27 0701 - 07/28 0700 In: 2173.8 [I.V.:1838.8; Blood:335] Out: 1000 [Urine:850; Blood:150] Intake/Output this shift: No intake/output data recorded.   Recent Labs  05/01/17 0505 05/02/17 0354  HGB 8.9* 8.1*    Recent Labs  05/01/17 0505 05/02/17 0354  WBC 8.7 8.0  RBC 2.74* 2.55*  HCT 27.5* 24.4*  PLT 357 272    Recent Labs  05/01/17 0505 05/02/17 0354  NA 138 134*  K 4.1 4.6  CL 101 101  CO2 27 25  BUN 24* 24*  CREATININE 0.86 0.83  GLUCOSE 106* 128*  CALCIUM 10.1 9.1    Recent Labs  05/01/17 0505  INR 1.17    Incision: dressing C/D/I  Alert and oriented x 3. Able to perform plantarflexion and dorsiflexion against resistance bilaterally.  Neurovascularly intact grossly.  Distal pulses 2+ bilaterally.    Assessment/Plan: 1 Day Post-Op Procedure(s) (LRB): INTRAMEDULLARY (IM) NAIL INTERTROCHANTRIC (Right) Up with therapy - WBAT of the RLE Keep the dressing clean and dry. Continue with Lovenox while in the hospital. Continue with pain management as needed.    Brynda Peon 05/02/2017, 9:50 AM

## 2017-05-02 NOTE — Progress Notes (Addendum)
[name]  FYB:[id number] DOB: 09/23/1930 DOA: 05/01/2017 PCP: Orpah Melter, MD  Brief Narrative:   [name] is a 83 y.o. female with medical history significant for  IBS,anxiety/depression, chronic dysphagia 3 (thin liquids and asp.precautions), chronic pain due to arthritis, recent admission for pneumonia d/c 7/3, presented to the ED after slipping and falling in her bathroom. She struck her right hip and felt it pop.  She was found to have a closed displaced intertrochanteric right femoral fracture.  She underwent intramedullary implant insertion by Dr. Stann Mainland on 7/27.    Assessment & Plan:   Active Problems:   Dysphagia   Osteoarthritis   IBS (irritable bowel syndrome)   GERD (gastroesophageal reflux disease)   Consolidation lung (HCC) left lower lobe   Loss of weight   Pleural effusion   Protein-calorie malnutrition, severe   Bronchiectasis without complication (HCC)   Hip fracture (HCC)   Depression  Closed displaced intertrochanteric R femoral Fracture in a patient with a history of arthritis, POD 1 s/p IM nail  -  lovenox for DVT proph in hospital, followed by asa 81mg  BID at discharge -  WBAT RLE -  F/u in 2 weeks for staple removal -  PT/OT recommending SNF  Depression  Continue home Cymbalta and Halcion  GERD/ Dysphagia 3. Severe protein malnutrition . Esophagram 5/1 with esophageal dysmotility, no masses.  Has gained some weight. -  Continue supplements  Anemia of chronic disease hemoglobin 8.9, with a history of normocytic anemia, chronic (BL 8-10). Trended down slightly but no need for blood transfusion at this time. -  CBC in AM and consider transfusion for hgb < 7.5  Restless leg syndrome Continue Requip in am   Known LLL mass vs. Consolidation and B effusions, seen by Pulmonary on 7/13 To f/u as outpatient with Pulmonology Respiratory workup is ongoing  Continue Mucinex and prn cough med  CXR with same findings, no  acute changes . Repeat in am    DVT prophylaxis:  lovenox Code Status:  DNR Family Communication:  Patient alone Disposition Plan:  To SNF in 1-2 days   Consultants:   Orthopedic surgery   Procedures:  IM nail of Right intertrochanteric fracture on 7/27  Antimicrobials:  Anti-infectives    Start     Dose/Rate Route Frequency Ordered Stop   05/01/17 1900  ceFAZolin (ANCEF) IVPB 1 g/50 mL premix     1 g 100 mL/hr over 30 Minutes Intravenous Every 6 hours 05/01/17 1608 05/02/17 1340   05/01/17 0930  ceFAZolin (ANCEF) IVPB 2g/100 mL premix     2 g 200 mL/hr over 30 Minutes Intravenous To [facility] 05/01/17 0903 05/01/17 1332       Subjective: none  Objective: Vitals:   05/01/17 1525 05/01/17 1555 05/01/17 2015 05/02/17 1632  BP:  120/65 (!) 100/56 (!) 96/52  Pulse:  87 90 93  Resp:  18 18 18   Temp: 97.7 F (36.5 C) 97.7 F (36.5 C) 98.7 F (37.1 C) 98.3 F (36.8 C)  TempSrc:    Oral  SpO2:  95% 97% 94%  Weight:      Height:        Intake/Output Summary (Last 24 hours) at 05/02/17 1713 Last data filed at 05/02/17 5277  Gross per 24 hour  Intake           938.75 ml  Output              500 ml  Net           438.75 ml   Filed Weights   05/01/17 0310  Weight: 49.4 kg (109 lb)    Examination:  General exam:  Adult female, cachectic.  No acute distress.  HEENT:  NCAT, MMM Respiratory system:  Scattered rales bilaterally, no wheezes or rhonchi Cardiovascular system: Regular rate and rhythm, normal S1/S2. No murmurs, rubs, gallops or clicks.  Warm extremities Gastrointestinal system: Normal active bowel sounds, soft, nondistended, nontender. MSK:  Normal tone and bulk, trace swelling of the right thigh and possible small knee effusion.  Dressing is c/d/i, no hematoma palpable and no ecchymosis Neuro:  Grossly moves all extremities.  SITLT on right foot    Data Reviewed: I have personally reviewed following labs and imaging  studies  CBC:  Recent Labs Lab 05/01/17 0505 05/02/17 0354  WBC 8.7 8.0  NEUTROABS 6.2  --   HGB 8.9* 8.1*  HCT 27.5* 24.4*  MCV 100.4* 95.7  PLT 357 427   Basic Metabolic Panel:  Recent Labs Lab 05/01/17 0505 05/02/17 0354  NA 138 134*  K 4.1 4.6  CL 101 101  CO2 27 25  GLUCOSE 106* 128*  BUN 24* 24*  CREATININE 0.86 0.83  CALCIUM 10.1 9.1   GFR: Estimated Creatinine Clearance: 33.6 mL/min (by C-G formula based on SCr of 0.83 mg/dL). Liver Function Tests: No results for input(s): AST, ALT, ALKPHOS, BILITOT, PROT, ALBUMIN in the last 168 hours. No results for input(s): LIPASE, AMYLASE in the last 168 hours. No results for input(s): AMMONIA in the last 168 hours. Coagulation Profile:  Recent Labs Lab 05/01/17 0505  INR 1.17   Cardiac Enzymes: No results for input(s): CKTOTAL, CKMB, CKMBINDEX, TROPONINI in the last 168 hours. BNP (last 3 results) No results for input(s): PROBNP in the last 8760 hours. HbA1C: No results for input(s): HGBA1C in the last 72 hours. CBG: No results for input(s): GLUCAP in the last 168 hours. Lipid Profile: No results for input(s): CHOL, HDL, LDLCALC, TRIG, CHOLHDL, LDLDIRECT in the last 72 hours. Thyroid Function Tests: No results for input(s): TSH, T4TOTAL, FREET4, T3FREE, THYROIDAB in the last 72 hours. Anemia Panel: No results for input(s): VITAMINB12, FOLATE, FERRITIN, TIBC, IRON, RETICCTPCT in the last 72 hours. Urine analysis:    Component Value Date/Time   COLORURINE YELLOW 05/02/2017 1041   APPEARANCEUR CLEAR 05/02/2017 1041   LABSPEC 1.018 05/02/2017 1041   PHURINE 5.0 05/02/2017 1041   GLUCOSEU NEGATIVE 05/02/2017 1041   HGBUR NEGATIVE 05/02/2017 1041   [place] 05/02/2017 [place] 05/02/2017 1041   PROTEINUR NEGATIVE 05/02/2017 1041   NITRITE NEGATIVE 05/02/2017 1041   LEUKOCYTESUR NEGATIVE 05/02/2017 1041   Sepsis Labs: @LABRCNTIP (procalcitonin:4,lacticidven:4)  ) Recent  Results (from the past 240 hour(s))  Surgical PCR screen     Status: None   Collection Time: 05/01/17 11:25 AM  Result Value Ref Range Status   MRSA, PCR NEGATIVE NEGATIVE Final   Staphylococcus aureus NEGATIVE NEGATIVE Final    Comment:        The Xpert SA Assay (FDA approved for NASAL specimens in patients over 19 years of age), is one component of a comprehensive surveillance program.  Test performance has been validated by [facility] for patients greater than or equal to 23 year old. It is not intended to diagnose infection nor to guide or monitor treatment.       Radiology Studies: Dg Chest 1 View  Result Date: 05/01/2017 CLINICAL DATA:  Pain after falling 1 day ago EXAM: CHEST 1 VIEW COMPARISON:  03/03/2017 FINDINGS: Diffuse interstitial prominence, likely interstitial edema. Perihilar airspace opacities likely represent alveolar edema. Small pleural effusions, right greater than left. Moderate cardiomegaly. IMPRESSION: The findings likely represent congestive heart failure with interstitial and alveolar edema. Electronically Signed   By: Andreas Newport M.D.   On: 05/01/2017 05:27   Ct Head Wo Contrast  Result Date: 05/01/2017 CLINICAL DATA:  Headache.  Fell during the night. EXAM: CT HEAD WITHOUT CONTRAST CT CERVICAL SPINE WITHOUT CONTRAST TECHNIQUE: Multidetector CT imaging of the head and cervical spine was performed following the standard protocol without intravenous contrast. Multiplanar CT image reconstructions of the cervical spine were also generated. COMPARISON:  12/03/2005 FINDINGS: CT HEAD FINDINGS Brain: There is no intracranial hemorrhage, mass or evidence of acute infarction. There is mild generalized atrophy. There is mild chronic microvascular ischemic change. There is no significant extra-axial fluid collection. No acute intracranial findings are evident. Vascular: No hyperdense vessel or unexpected calcification. Skull: Normal. Negative for fracture or focal  lesion. Sinuses/Orbits: No acute finding. Other: None. CT CERVICAL SPINE FINDINGS Alignment: Normal. Skull base and vertebrae: No acute fracture. No primary bone lesion or focal pathologic process. Soft tissues and spinal canal: No prevertebral fluid or swelling. No visible canal hematoma. Disc levels: Moderately severe degenerative cervical disc disease at C5-6 and C6-7. Prominent facet arthritis, greatest at C4-5. Grade 1 degenerative spondylolisthesis at C4-5. Upper chest: Bilateral pleural effusions. Other: None IMPRESSION: 1. No acute intracranial findings. There is mild generalized atrophy and chronic appearing white matter hypodensities which likely represent small vessel ischemic disease. 2. Negative for acute cervical spine fracture. 3. Bilateral pleural effusions. Electronically Signed   By: Andreas Newport M.D.   On: 05/01/2017 05:49   Ct Cervical Spine Wo Contrast  Result Date: 05/01/2017 CLINICAL DATA:  Headache.  Fell during the night. EXAM: CT HEAD WITHOUT CONTRAST CT CERVICAL SPINE WITHOUT CONTRAST TECHNIQUE: Multidetector CT imaging of the head and cervical spine was performed following the standard protocol without intravenous contrast. Multiplanar CT image reconstructions of the cervical spine were also generated. COMPARISON:  12/03/2005 FINDINGS: CT HEAD FINDINGS Brain: There is no intracranial hemorrhage, mass or evidence of acute infarction. There is mild generalized atrophy. There is mild chronic microvascular ischemic change. There is no significant extra-axial fluid collection. No acute intracranial findings are evident. Vascular: No hyperdense vessel or unexpected calcification. Skull: Normal. Negative for fracture or focal lesion. Sinuses/Orbits: No acute finding. Other: None. CT CERVICAL SPINE FINDINGS Alignment: Normal. Skull base and vertebrae: No acute fracture. No primary bone lesion or focal pathologic process. Soft tissues and spinal canal: No prevertebral fluid or swelling.  No visible canal hematoma. Disc levels: Moderately severe degenerative cervical disc disease at C5-6 and C6-7. Prominent facet arthritis, greatest at C4-5. Grade 1 degenerative spondylolisthesis at C4-5. Upper chest: Bilateral pleural effusions. Other: None IMPRESSION: 1. No acute intracranial findings. There is mild generalized atrophy and chronic appearing white matter hypodensities which likely represent small vessel ischemic disease. 2. Negative for acute cervical spine fracture. 3. Bilateral pleural effusions. Electronically Signed   By: Andreas Newport M.D.   On: 05/01/2017 05:49   Chest Portable 1 View  Result Date: 05/02/2017 CLINICAL DATA:  Lung consolidation.  Effusions EXAM: PORTABLE CHEST 1 VIEW COMPARISON:  05/01/2017 FINDINGS: Cardiomegaly with vascular congestion. Small bilateral effusions with bibasilar opacities, likely atelectasis. Improving interstitial prominence compatible with improving edema. IMPRESSION: Improving CHF pattern. Continued small effusions and bibasilar atelectasis. Electronically  Signed   By: Rolm Baptise M.D.   On: 05/02/2017 08:09   Dg Humerus Right  Result Date: 05/01/2017 CLINICAL DATA:  Right upper extremity pain after falling 1 day ago EXAM: RIGHT HUMERUS - 2+ VIEW COMPARISON:  None. FINDINGS: Negative for acute fracture. Severe chronic right shoulder arthropathy. No discrete bone lesion. No acute soft tissue abnormality. IMPRESSION: Negative for acute fracture or dislocation. Severe chronic right shoulder arthropathy. Electronically Signed   By: Andreas Newport M.D.   On: 05/01/2017 05:26   Dg C-arm 1-60 Min  Result Date: 05/01/2017 CLINICAL DATA:  Fracture.  Pain. EXAM: DG C-ARM 61-120 MIN; RIGHT FEMUR 2 VIEWS COMPARISON:  Plain films 05/01/2017. FINDINGS: Compression screw with intramedullary rod fixation across an intertrochanteric fracture. Improved position and alignment. Lesser trochanter is sheared off. IMPRESSION: Satisfactory position and  alignment. Electronically Signed   By: Staci Righter M.D.   On: 05/01/2017 14:35   Dg Hip Unilat  With Pelvis 2-3 Views Right  Result Date: 05/01/2017 CLINICAL DATA:  Right hip pain for 1 day after falling EXAM: DG HIP (WITH OR WITHOUT PELVIS) 2-3V RIGHT COMPARISON:  None. FINDINGS: There is an intertrochanteric right hip fracture with moderate comminution and mild varus angulation. No dislocation. No radiographic evidence of a pathologic basis for the fracture. Bony pelvis is intact. Pubic symphysis and sacroiliac joints appear intact. IMPRESSION: Intertrochanteric right hip fracture Electronically Signed   By: Andreas Newport M.D.   On: 05/01/2017 04:31   Dg Femur, Min 2 Views Right  Result Date: 05/01/2017 CLINICAL DATA:  Fracture.  Pain. EXAM: DG C-ARM 61-120 MIN; RIGHT FEMUR 2 VIEWS COMPARISON:  Plain films 05/01/2017. FINDINGS: Compression screw with intramedullary rod fixation across an intertrochanteric fracture. Improved position and alignment. Lesser trochanter is sheared off. IMPRESSION: Satisfactory position and alignment. Electronically Signed   By: Staci Righter M.D.   On: 05/01/2017 14:35   Dg Femur, Min 2 Views Right  Result Date: 05/01/2017 CLINICAL DATA:  Right hip fracture. EXAM: RIGHT FEMUR 2 VIEWS COMPARISON:  Pelvic x-rays from same date. FINDINGS: Again seen is a comminuted intertrochanteric right femur fracture with mild varus angulation. No additional fracture seen. Status post right total knee arthroplasty. Osteopenia. IMPRESSION: Comminuted right intertrochanteric femur fracture with mild varus angulation. Electronically Signed   By: Titus Dubin M.D.   On: 05/01/2017 08:25     Scheduled Meds: . celecoxib  100 mg Oral BID  . DULoxetine  60 mg Oral QHS  . enoxaparin (LOVENOX) injection  30 mg Subcutaneous Q24H  . feeding supplement  1 Container Oral TID BM  . pantoprazole  40 mg Oral Daily  . polyethylene glycol  34 g Oral QHS  . senna  1 tablet Oral BID  .  triazolam  0.25 mg Oral QHS   Continuous Infusions: . methocarbamol (ROBAXIN)  IV       LOS: 1 day    Time spent: 30 min    Janece Canterbury, MD [facility] Pager [phone]  If 7PM-7AM, please contact night-coverage [email or web address] Password [facility] 05/02/2017, 5:13 PM

## 2017-05-02 NOTE — Evaluation (Signed)
Occupational Therapy Evaluation Patient Details Name: [name] MRN: [id number] DOB: 11/16/1929 Today's Date: 05/02/2017    History of Present Illness Pt is an 83 yo female admitted through ED on 05/01/17 following a fall resulting in a R hip fx. Pt underwent an IM nail on 05/01/17. PMH significant for IBS, anxiety/depression, chronic dysphagia, chronic Pain, penumonia 04/07/17.    Clinical Impression   Pt reports she required assist for ADL PTA from caregivers. Currently pt requires mod assist for short distance mobility in room, min assist for UB ADL, and max assist for LB ADL. Recommending SNF for follow up to maximize independence and safety with ADL and functional mobility prior to return home. Pt would benefit from continued skilled OT to address established goals.    Follow Up Recommendations  SNF;Supervision/Assistance - 24 hour    Equipment Recommendations  None recommended by OT    Recommendations for Other Services       Precautions / Restrictions Precautions Precautions: Fall Restrictions Weight Bearing Restrictions: Yes RLE Weight Bearing: Weight bearing as tolerated      Mobility Bed Mobility Overal bed mobility: Needs Assistance Bed Mobility: Sit to Supine     Supine to sit: Mod assist Sit to supine: Mod assist;HOB elevated   General bed mobility comments: Assist for controlled descent of trunk to bed and for LEs into bed  Transfers Overall transfer level: Needs assistance Equipment used: Rolling walker (2 wheeled) Transfers: Sit to/from [facility] Sit to Stand: Min assist Stand pivot transfers: Mod assist       General transfer comment: Min assist to boost up from [facility], mod assist for pivot 180 degrees to bed    Balance Overall balance assessment: Needs assistance Sitting-balance support: Single extremity supported;Feet unsupported Sitting balance-Leahy Scale: Fair     Standing balance support: Bilateral upper extremity  supported Standing balance-Leahy Scale: Poor Standing balance comment: reliant on RW for stability in standing                           ADL either performed or assessed with clinical judgement   ADL Overall ADL's : Needs assistance/impaired Eating/Feeding: Set up;Sitting   Grooming: Minimal assistance;Sitting   Upper Body Bathing: Minimal assistance;Sitting   Lower Body Bathing: Maximal assistance;Sit to/from stand   Upper Body Dressing : Minimal assistance;Sitting   Lower Body Dressing: Maximal assistance;Sit to/from stand   Toilet Transfer: Moderate assistance;Stand-pivot;BSC;RW   Toileting- Clothing Manipulation and Hygiene: Minimal assistance;Sit to/from stand       Functional mobility during ADLs: Moderate assistance;Rolling walker       Vision         Perception     Praxis      Pertinent Vitals/Pain Pain Assessment: Faces Pain Score: 8  Faces Pain Scale: Hurts even more Pain Location: R hip Pain Descriptors / Indicators: Aching;Grimacing;Guarding;Sore Pain Intervention(s): Monitored during session;Repositioned     Hand Dominance Right   Extremity/Trunk Assessment Upper Extremity Assessment Upper Extremity Assessment: Generalized weakness (hx shoulder and wrist pain/weakness)   Lower Extremity Assessment Lower Extremity Assessment: Defer to PT evaluation RLE Deficits / Details: pt with post op pain and weakness. At least 3/5 grossly RLE RLE: Unable to fully assess due to pain   Cervical / Trunk Assessment Cervical / Trunk Assessment: Kyphotic   Communication Communication Communication: HOH   Cognition Arousal/Alertness: Awake/alert Behavior During Therapy: WFL for tasks assessed/performed Overall Cognitive Status: Within Functional Limits for tasks assessed  General Comments  Pt has personal care services 6 hrs a day, but can increase her time when she returns home.      Exercises     Shoulder Instructions      Home Living Family/patient expects to be discharged to:: Private residence Living Arrangements: Children Available Help at Discharge: Family;Available PRN/intermittently;Personal care attendant (has comfort keepers from 9-2. Alone 4-5 hrs a day) Type of Home: House Home Access: Stairs to enter [employer] of Steps: 4 Entrance Stairs-Rails: Left;Right;Can reach both Home Layout: Two level;Able to live on main level with bedroom/bathroom     Bathroom Shower/Tub: Tub/shower unit;Walk-in shower   Bathroom Toilet: Handicapped height     Home Equipment: Bedside commode;Shower seat;Walker - 4 wheels;Walker - 2 wheels          Prior Functioning/Environment Level of Independence: Needs assistance  Gait / Transfers Assistance Needed: Uses rollator or household ambulatory after admission in May. Reports falls. ADL's / Homemaking Assistance Needed: Needs assist with ADLs- has a caregiver come in 9-2 daily to assist with bathing, dressing, IADLs. Family helps with meals.   Comments: hasn't had any falls since last admission        OT Problem List: Decreased strength;Decreased range of motion;Decreased activity tolerance;Impaired balance (sitting and/or standing);Decreased knowledge of use of DME or AE;Decreased knowledge of precautions;Pain      OT Treatment/Interventions: Self-care/ADL training;Therapeutic exercise;Energy conservation;DME and/or AE instruction;Therapeutic activities;Patient/family education;Balance training    OT Goals(Current goals can be found in the care plan section) Acute Rehab OT Goals Patient Stated Goal: rehab then home OT Goal Formulation: With patient Time For Goal Achievement: 05/16/17 Potential to Achieve Goals: Good ADL Goals Pt Will Perform Lower Body Bathing: with min assist;sit to/from stand Pt Will Perform Lower Body Dressing: with min assist;sit to/from stand Pt Will Transfer to Toilet: with  min guard assist;ambulating;bedside commode  OT Frequency: Min 2X/week   Barriers to D/C:            Co-evaluation              AM-PAC PT "6 Clicks" Daily Activity     Outcome Measure Help from another person eating meals?: None Help from another person taking care of personal grooming?: A Little Help from another person toileting, which includes using toliet, bedpan, or urinal?: A Lot Help from another person bathing (including washing, rinsing, drying)?: A Lot Help from another person to put on and taking off regular upper body clothing?: A Little Help from another person to put on and taking off regular lower body clothing?: A Lot 6 Click Score: 16   End of Session Equipment Utilized During Treatment: Rolling walker  Activity Tolerance: Patient tolerated treatment well Patient left: in bed;with call bell/phone within reach  OT Visit Diagnosis: Unsteadiness on feet (R26.81);Other abnormalities of gait and mobility (R26.89)                Time: 0998-3382 OT Time Calculation (min): 14 min Charges:  OT General Charges $OT Visit: 1 Procedure OT Evaluation $OT Eval Moderate Complexity: 1 Procedure G-Codes:     Yarel Kilcrease A. Ulice Brilliant, M.S., OTR/L Pager: [place] 05/02/2017, 3:50 PM

## 2017-05-03 DIAGNOSIS — D62 Acute posthemorrhagic anemia: Secondary | ICD-10-CM

## 2017-05-03 LAB — HEMOGLOBIN AND HEMATOCRIT, BLOOD
HEMATOCRIT: 29.1 % — AB (ref 36.0–46.0)
HEMOGLOBIN: 9.6 g/dL — AB (ref 12.0–15.0)

## 2017-05-03 LAB — CBC
HCT: 24.8 % — ABNORMAL LOW (ref 36.0–46.0)
HEMATOCRIT: 20.1 % — AB (ref 36.0–46.0)
HEMOGLOBIN: 8.3 g/dL — AB (ref 12.0–15.0)
Hemoglobin: 6.7 g/dL — CL (ref 12.0–15.0)
MCH: 32.4 pg (ref 26.0–34.0)
MCH: 32.2 pg (ref 26.0–34.0)
MCHC: 33.3 g/dL (ref 30.0–36.0)
MCHC: 33.5 g/dL (ref 30.0–36.0)
MCV: 97.1 fL (ref 78.0–100.0)
MCV: 96.1 fL (ref 78.0–100.0)
PLATELETS: 229 10*3/uL (ref 150–400)
Platelets: 217 10*3/uL (ref 150–400)
RBC: 2.07 MIL/uL — ABNORMAL LOW (ref 3.87–5.11)
RBC: 2.58 MIL/uL — AB (ref 3.87–5.11)
RDW: 15.5 % (ref 11.5–15.5)
RDW: 15.8 % — ABNORMAL HIGH (ref 11.5–15.5)
WBC: 9.3 10*3/uL (ref 4.0–10.5)
WBC: 8.1 10*3/uL (ref 4.0–10.5)

## 2017-05-03 LAB — PREPARE RBC (CROSSMATCH)

## 2017-05-03 MED ORDER — CALCIUM CARBONATE ANTACID 500 MG PO CHEW
2.0000 | CHEWABLE_TABLET | ORAL | Status: DC | PRN
  Filled 2017-05-03: qty 2

## 2017-05-03 MED ORDER — SODIUM CHLORIDE 0.9 % IV SOLN: Freq: Once | INTRAVENOUS | Status: DC

## 2017-05-03 NOTE — Progress Notes (Signed)
Pt has hgb of 6.7 this morning. Hospitalist notified.

## 2017-05-03 NOTE — Progress Notes (Signed)
Patient ID: [name], female   DOB: 1930-04-28, 83 y.o.   MRN: [id number] Subjective: 2 Days Post-Op Procedure(s) (LRB): INTRAMEDULLARY (IM) NAIL INTERTROCHANTRIC (Right)    Patient reports pain as mild to moderate but not a lot of activity as of now.  Confounding pain issues related to chronic shoulder OA and carpal tunnel syndrome  Getting one unit of pRBCs today.    Objective:   VITALS:   Vitals:   05/03/17 1500 05/03/17 2055  BP: (!) 86/62 107/70  Pulse: 89 (!) 103  Resp: 17 17  Temp: 98.1 F (36.7 C) 98 F (36.7 C)    Neurovascular intact Incision: dressing C/D/I  +SILT dp/sp/sur/saph/TN + TA/GSC/FHL/EHL 2+ DP  LABS  Recent Labs  05/02/17 0354 05/03/17 0300 05/03/17 1309 05/03/17 1503  HGB 8.1* 6.7* 8.3* 9.6*  HCT 24.4* 20.1* 24.8* 29.1*  WBC 8.0 9.3 8.1  --   PLT 272 229 217  --      Recent Labs  05/01/17 0505 05/02/17 0354  NA 138 134*  K 4.1 4.6  BUN 24* 24*  CREATININE 0.86 0.83  GLUCOSE 106* 128*     Recent Labs  05/01/17 0505  INR 1.17     Assessment/Plan: 2 Days Post-Op Procedure(s) (LRB): INTRAMEDULLARY (IM) NAIL INTERTROCHANTRIC (Right)   Advance diet Up with therapy  Discharge pending further therapy assessment Pain control DVT prophylaxis with bid asa and SCDs Acute blood loss anemia responded well to one unit today

## 2017-05-03 NOTE — Progress Notes (Signed)
PROGRESS NOTE  [name]  ZOX:[id number] DOB: 07-02-1930 DOA: 05/01/2017 PCP: Orpah Melter, MD  Brief Narrative:   [name] is a 83 y.o. female with medical history significant for  IBS,anxiety/depression, chronic dysphagia 3 (thin liquids and asp.precautions), chronic pain due to arthritis, recent admission for pneumonia d/c 7/3, presented to the ED after slipping and falling in her bathroom. She struck her right hip and felt it pop.  She was found to have a closed displaced intertrochanteric right femoral fracture.  She underwent intramedullary implant insertion by Dr. Stann Mainland on 7/27.  Postoperative course complicated by acute blood loss anemia requiring blood transfusion today.  Assessment & Plan:   Active Problems:   Dysphagia   Osteoarthritis   IBS (irritable bowel syndrome)   GERD (gastroesophageal reflux disease)   Consolidation lung (HCC) left lower lobe   Loss of weight   Pleural effusion   Protein-calorie malnutrition, severe   Bronchiectasis without complication (HCC)   Hip fracture (HCC)   Depression  Closed displaced intertrochanteric R femoral Fracture in a patient with a history of arthritis, POD 1 s/p IM nail  -  lovenox for DVT proph in hospital, followed by asa 81mg  BID at discharge -  WBAT RLE -  F/u in 2 weeks for staple removal -  PT/OT recommending SNF, however the patient would like to return home  Depression  Continue home Cymbalta and Halcion  GERD/ Dysphagia 3. Severe protein malnutrition . Esophagram 5/1 with esophageal dysmotility, no masses.  Has gained some weight. -  Continue supplements  Anemia of chronic disease hemoglobin 8.9, with a history of normocytic anemia, chronic (BL 8-10). Trended down slightly but no need for blood transfusion at this time. -  Transfuse 1 unit PRBC and repeat hematocrit this afternoon, additional transfusions if needed  Restless leg syndrome Continue Requip in am   Known LLL mass vs.  Consolidation and B effusions, seen by Pulmonary on 7/13 To f/u as outpatient with Pulmonology   DVT prophylaxis:  lovenox Code Status:  DNR Family Communication:  Patient alone Disposition Plan:  To SNF in 1-2 days   Consultants:   Orthopedic surgery   Procedures:  IM nail of Right intertrochanteric fracture on 7/27  Antimicrobials:  Anti-infectives    Start     Dose/Rate Route Frequency Ordered Stop   05/01/17 1900  ceFAZolin (ANCEF) IVPB 1 g/50 mL premix     1 g 100 mL/hr over 30 Minutes Intravenous Every 6 hours 05/01/17 1608 05/02/17 1340   05/01/17 0930  ceFAZolin (ANCEF) IVPB 2g/100 mL premix     2 g 200 mL/hr over 30 Minutes Intravenous To [facility] 05/01/17 0903 05/01/17 1332       Subjective:  Has diffuse chronic pains, and her left shoulder pain is exacerbated by trying to get up with a walker to ambulate. Her hip pain has been controlled with oral pain medications. She is feeling really tired today. She was encouraged because she was able to get up 4 times with therapy yesterday and she has had in the chair for a while.  Objective: Vitals:   05/02/17 2032 05/03/17 0456 05/03/17 0800 05/03/17 0840  BP: (!) 103/47 (!) 99/56 (!) 104/52 (!) 98/53  Pulse: 100 94 95 91  Resp: 16 16 18 18   Temp: 98.3 F (36.8 C) 98 F (36.7 C) 98.2 F (36.8 C) 98 F (36.7 C)  TempSrc: Oral Oral Oral Oral  SpO2: 95% 92% 92% 98%  Weight:  Height:        Intake/Output Summary (Last 24 hours) at 05/03/17 1113 Last data filed at 05/03/17 0840  Gross per 24 hour  Intake              890 ml  Output                0 ml  Net              890 ml   Filed Weights   05/01/17 0310  Weight: 49.4 kg (109 lb)    Examination:  General exam:  Cachectic adult female, no acute distress, pallor HEENT:  There is about atraumatic, pale mucous membranes, moist because membranes  Respiratory system:  Scattered bilateral rales particularly at the bases, no wheezes or rhonchi    Cardiovascular system: Regular rate and rhythm, 875 systolic murmur at the left sternal border, warm extremities Gastrointestinal system: NABS, soft, nondistended, nontender  MSK: Normal tone and bulk, trace swelling of the right thigh but no right knee effusions. Her dressing is clean, dry, intact with no palpable hematoma and minimal ecchymoses Neuro:  2+ pedal pulses, sensation intact to light touch in the right foot, grossly moves all extremities  Data Reviewed: I have personally reviewed following labs and imaging studies  CBC:  Recent Labs Lab 05/01/17 0505 05/02/17 0354 05/03/17 0300  WBC 8.7 8.0 9.3  NEUTROABS 6.2  --   --   HGB 8.9* 8.1* 6.7*  HCT 27.5* 24.4* 20.1*  MCV 100.4* 95.7 97.1  PLT 357 272 643   Basic Metabolic Panel:  Recent Labs Lab 05/01/17 0505 05/02/17 0354  NA 138 134*  K 4.1 4.6  CL 101 101  CO2 27 25  GLUCOSE 106* 128*  BUN 24* 24*  CREATININE 0.86 0.83  CALCIUM 10.1 9.1   GFR: Estimated Creatinine Clearance: 33.6 mL/min (by C-G formula based on SCr of 0.83 mg/dL). Liver Function Tests: No results for input(s): AST, ALT, ALKPHOS, BILITOT, PROT, ALBUMIN in the last 168 hours. No results for input(s): LIPASE, AMYLASE in the last 168 hours. No results for input(s): AMMONIA in the last 168 hours. Coagulation Profile:  Recent Labs Lab 05/01/17 0505  INR 1.17   Cardiac Enzymes: No results for input(s): CKTOTAL, CKMB, CKMBINDEX, TROPONINI in the last 168 hours. BNP (last 3 results) No results for input(s): PROBNP in the last 8760 hours. HbA1C: No results for input(s): HGBA1C in the last 72 hours. CBG: No results for input(s): GLUCAP in the last 168 hours. Lipid Profile: No results for input(s): CHOL, HDL, LDLCALC, TRIG, CHOLHDL, LDLDIRECT in the last 72 hours. Thyroid Function Tests: No results for input(s): TSH, T4TOTAL, FREET4, T3FREE, THYROIDAB in the last 72 hours. Anemia Panel: No results for input(s): VITAMINB12, FOLATE,  FERRITIN, TIBC, IRON, RETICCTPCT in the last 72 hours. Urine analysis:    Component Value Date/Time   COLORURINE YELLOW 05/02/2017 [place] 05/02/2017 1041   LABSPEC 1.018 05/02/2017 1041   PHURINE 5.0 05/02/2017 1041   GLUCOSEU NEGATIVE 05/02/2017 1041   HGBUR NEGATIVE 05/02/2017 [place] 05/02/2017 [place] 05/02/2017 1041   PROTEINUR NEGATIVE 05/02/2017 1041   NITRITE NEGATIVE 05/02/2017 1041   LEUKOCYTESUR NEGATIVE 05/02/2017 1041   Sepsis Labs: @LABRCNTIP (procalcitonin:4,lacticidven:4)  ) Recent Results (from the past 240 hour(s))  Surgical PCR screen     Status: None   Collection Time: 05/01/17 11:25 AM  Result Value Ref Range Status   MRSA, PCR NEGATIVE  NEGATIVE Final   Staphylococcus aureus NEGATIVE NEGATIVE Final    Comment:        The Xpert SA Assay (FDA approved for NASAL specimens in patients over 31 years of age), is one component of a comprehensive surveillance program.  Test performance has been validated by [facility] [facility] for patients greater than or equal to 70 year old. It is not intended to diagnose infection nor to guide or monitor treatment.       Radiology Studies: Chest Portable 1 View  Result Date: 05/02/2017 CLINICAL DATA:  Lung consolidation.  Effusions EXAM: PORTABLE CHEST 1 VIEW COMPARISON:  05/01/2017 FINDINGS: Cardiomegaly with vascular congestion. Small bilateral effusions with bibasilar opacities, likely atelectasis. Improving interstitial prominence compatible with improving edema. IMPRESSION: Improving CHF pattern. Continued small effusions and bibasilar atelectasis. Electronically Signed   By: Rolm Baptise M.D.   On: 05/02/2017 08:09   Dg C-arm 1-60 Min  Result Date: 05/01/2017 CLINICAL DATA:  Fracture.  Pain. EXAM: DG C-ARM 61-120 MIN; RIGHT FEMUR 2 VIEWS COMPARISON:  Plain films 05/01/2017. FINDINGS: Compression screw with intramedullary rod fixation across an intertrochanteric  fracture. Improved position and alignment. Lesser trochanter is sheared off. IMPRESSION: Satisfactory position and alignment. Electronically Signed   By: Staci Righter M.D.   On: 05/01/2017 14:35   Dg Femur, Min 2 Views Right  Result Date: 05/01/2017 CLINICAL DATA:  Fracture.  Pain. EXAM: DG C-ARM 61-120 MIN; RIGHT FEMUR 2 VIEWS COMPARISON:  Plain films 05/01/2017. FINDINGS: Compression screw with intramedullary rod fixation across an intertrochanteric fracture. Improved position and alignment. Lesser trochanter is sheared off. IMPRESSION: Satisfactory position and alignment. Electronically Signed   By: Staci Righter M.D.   On: 05/01/2017 14:35     Scheduled Meds: . celecoxib  100 mg Oral BID  . DULoxetine  60 mg Oral QHS  . enoxaparin (LOVENOX) injection  30 mg Subcutaneous Q24H  . feeding supplement  1 Container Oral TID BM  . pantoprazole  40 mg Oral Daily  . polyethylene glycol  34 g Oral QHS  . senna  1 tablet Oral BID  . triazolam  0.25 mg Oral QHS   Continuous Infusions: . sodium chloride    . methocarbamol (ROBAXIN)  IV       LOS: 2 days    Time spent: 30 min    Janece Canterbury, MD [facility] Pager [phone]  If 7PM-7AM, please contact night-coverage [email or web address] Password [email or web address] 05/03/2017, 11:13 AM

## 2017-05-03 NOTE — Progress Notes (Signed)
Patient ID: [name], female   DOB: 05/12/1930, 83 y.o.   MRN: [id number] Subjective: 2 Days Post-Op Procedure(s) (LRB): INTRAMEDULLARY (IM) NAIL INTERTROCHANTRIC (Right)    Patient reports pain as mild to moderate but not a lot of activity as of now.  Confounding pain issues related to chronic shoulder OA and carpal tunnel syndrome  Objective:   VITALS:   Vitals:   05/02/17 2032 05/03/17 0456  BP: (!) 103/47 (!) 99/56  Pulse: 100 94  Resp: 16 16  Temp: 98.3 F (36.8 C) 98 F (36.7 C)    Neurovascular intact Incision: dressing C/D/I  LABS  Recent Labs  05/01/17 0505 05/02/17 0354 05/03/17 0300  HGB 8.9* 8.1* 6.7*  HCT 27.5* 24.4* 20.1*  WBC 8.7 8.0 9.3  PLT 357 272 229     Recent Labs  05/01/17 0505 05/02/17 0354  NA 138 134*  K 4.1 4.6  BUN 24* 24*  CREATININE 0.86 0.83  GLUCOSE 106* 128*     Recent Labs  05/01/17 0505  INR 1.17     Assessment/Plan: 2 Days Post-Op Procedure(s) (LRB): INTRAMEDULLARY (IM) NAIL INTERTROCHANTRIC (Right)   Advance diet Up with therapy  She would like to be able to go home, reviewed necessary goals Discharge pending further therapy assessment Pain control DVT prophylaxis

## 2017-05-03 NOTE — NC FL2 (Signed)
[place] LEVEL OF CARE SCREENING TOOL     IDENTIFICATION  Patient Name: [name] Birthdate: 04-14-1930 Sex: female Admission Date (Current Location): 05/01/2017  [facility] and [place] Number:  Herbalist and Address:  [facility] [place] [facility], [place] [address], [place], [place] [address]      Provider Number: [id number]  Attending Physician Name and Address:  Janece Canterbury, MD  Relative Name and Phone Number:       Current Level of Care: Hospital Recommended Level of Care: [place] Prior Approval Number:    Date Approved/Denied:   PASRR Number:    Discharge Plan: SNF    Current Diagnoses: Patient Active Problem List   Diagnosis Date Noted  . Hip fracture ([place]) 05/01/2017  . Depression 05/01/2017  . Bronchiectasis without complication ([place]) 66/03/3015  . Protein-calorie malnutrition, severe 04/07/2017  . Pleural effusion   . Consolidation lung ([place]) left lower lobe 04/05/2017  . Loss of weight 04/05/2017  . Cough   . Left lower lobe pneumonia ([place]) 04/04/2017  . GERD (gastroesophageal reflux disease) 04/04/2017  . IBS (irritable bowel syndrome)   . Dysphagia 02/06/2017  . Osteoarthritis 02/06/2017    Orientation RESPIRATION BLADDER Height & Weight     Self, Time, Situation, Place  Normal Continent Weight: 109 lb (49.4 kg) Height:  4\' 9"  (144.8 cm)  BEHAVIORAL SYMPTOMS/MOOD NEUROLOGICAL BOWEL NUTRITION STATUS      Continent Diet (Regular with Thin Liquids)  AMBULATORY STATUS COMMUNICATION OF NEEDS Skin   Limited Assist Verbally Surgical wounds (Right Hip Incision)                       Personal Care Assistance Level of Assistance  Bathing, Feeding, Dressing Bathing Assistance: Limited assistance Feeding assistance: Limited assistance Dressing Assistance: Limited assistance     Functional Limitations Info  Sight, Hearing, Speech Sight Info: Adequate Hearing Info: Adequate Speech Info:  Adequate    SPECIAL CARE FACTORS FREQUENCY  PT (By licensed PT), OT (By licensed OT)     PT Frequency: 5x week OT Frequency: 2x week            Contractures Contractures Info: Not present    Additional Factors Info  Code Status, Allergies, Psychotropic Code Status Info: DNR Allergies Info: Cerefolin L-methylfolate-b12-b6-b2, Codeine, Lactose Intolerance (Gi) Psychotropic Info: Cymbalta         Current Medications (05/03/2017):  This is the current hospital active medication list Current Facility-Administered Medications  Medication Dose Route Frequency Provider Last Rate Last Dose  . 0.9 %  sodium chloride infusion   Intravenous Once Schorr, Rhetta Mura, NP      . acetaminophen (TYLENOL) tablet 650 mg  650 mg Oral Q6H PRN Nicholes Stairs, MD   650 mg at 05/02/17 2149   Reed  . acetaminophen (TYLENOL) suppository 650 mg  650 mg Rectal Q6H PRN Nicholes Stairs, MD      . albuterol (PROVENTIL) (2.5 MG/3ML) 0.083% nebulizer solution 2.5 mg  2.5 mg Nebulization Q2H PRN Elwin Mocha, MD      . benzonatate (TESSALON) capsule 100 mg  100 mg Oral TID PRN Rondel Jumbo, PA-C      . bisacodyl (DULCOLAX) EC tablet 5 mg  5 mg Oral Daily PRN Rondel Jumbo, PA-C      . celecoxib (CELEBREX) capsule 100 mg  100 mg Oral BID Rondel Jumbo, PA-C   100 mg at 05/03/17 0907  .  DULoxetine (CYMBALTA) DR capsule 60 mg  60 mg Oral QHS Rondel Jumbo, PA-C   60 mg at 05/02/17 2150  . enoxaparin (LOVENOX) injection 30 mg  30 mg Subcutaneous Q24H Nicholes Stairs, MD   30 mg at 05/03/17 3810  . feeding supplement (BOOST / RESOURCE BREEZE) liquid 1 Container  1 Container Oral TID BM Rondel Jumbo, PA-C   1 Container at 05/03/17 1359  . guaiFENesin (MUCINEX) 12 hr tablet 600 mg  600 mg Oral BID PRN Rondel Jumbo, PA-C      . HYDROcodone-acetaminophen (NORCO) 7.5-325 MG per tablet 1 tablet  1 tablet Oral Q4H PRN Rondel Jumbo, PA-C   1 tablet at 05/03/17 0913  . methocarbamol  (ROBAXIN) tablet 500 mg  500 mg Oral Q6H PRN Nicholes Stairs, MD   500 mg at 05/02/17 2149   Reed  . methocarbamol (ROBAXIN) 500 mg in dextrose 5 % 50 mL IVPB  500 mg Intravenous Q6H PRN Nicholes Stairs, MD      . metoCLOPramide [facility]) tablet 5-10 mg  5-10 mg Oral Q8H PRN Nicholes Stairs, MD       Reed  . metoCLOPramide [facility]) injection 5-10 mg  5-10 mg Intravenous Q8H PRN Nicholes Stairs, MD      . morphine 4 MG/ML injection 1 mg  1 mg Intravenous Q2H PRN Rondel Jumbo, PA-C   1 mg at 05/03/17 0636  . ondansetron (ZOFRAN) tablet 4 mg  4 mg Oral Q6H PRN Nicholes Stairs, MD       Reed  . ondansetron [facility]) injection 4 mg  4 mg Intravenous Q6H PRN Nicholes Stairs, MD      . pantoprazole (PROTONIX) EC tablet 40 mg  40 mg Oral Daily Rondel Jumbo, PA-C   40 mg at 05/03/17 1751  . polyethylene glycol (MIRALAX / GLYCOLAX) packet 34 g  34 g Oral QHS Rondel Jumbo, PA-C   34 g at 05/02/17 2148  . rOPINIRole (REQUIP) tablet 0.25 mg  0.25 mg Oral Daily PRN Rondel Jumbo, PA-C      . senna (SENOKOT) tablet 8.6 mg  1 tablet Oral BID Nicholes Stairs, MD   8.6 mg at 05/03/17 0906  . senna-docusate (Senokot-S) tablet 1 tablet  1 tablet Oral QHS PRN Rondel Jumbo, PA-C      . sodium phosphate (FLEET) 7-19 GM/118ML enema 1 enema  1 enema Rectal Once PRN Rondel Jumbo, PA-C      . tetrahydrozoline 0.05 % ophthalmic solution 1 drop  1 drop Both Eyes Daily PRN Shawn Route, Sara E, PA-C      . triazolam (HALCION) tablet 0.25 mg  0.25 mg Oral QHS Rondel Jumbo, PA-C   0.25 mg at 05/02/17 2150     Discharge Medications: Please see discharge summary for a list of discharge medications.  Relevant Imaging Results:  Relevant Lab Results:   Additional Information SSN [id number]    [name], [place]

## 2017-05-03 NOTE — Progress Notes (Signed)
PT Cancellation Note  Patient Details Name: [name] MRN: [id number] DOB: 07-17-1930   Cancelled Treatment:    Reason Eval/Treat Not Completed: Medical issues which prohibited therapy. Pt HGB 6.7. Will check back as time allows.    Scheryl Marten PT, DPT  [phone]  05/03/2017, 7:54 AM

## 2017-05-04 ENCOUNTER — Inpatient Hospital Stay (HOSPITAL_COMMUNITY): Payer: Medicare Other

## 2017-05-04 DIAGNOSIS — K219 Gastro-esophageal reflux disease without esophagitis: Secondary | ICD-10-CM

## 2017-05-04 DIAGNOSIS — F329 Major depressive disorder, single episode, unspecified: Secondary | ICD-10-CM

## 2017-05-04 DIAGNOSIS — J479 Bronchiectasis, uncomplicated: Secondary | ICD-10-CM

## 2017-05-04 LAB — TYPE AND SCREEN
ABO/RH(D): A POS
ANTIBODY SCREEN: NEGATIVE
UNIT DIVISION: 0
UNIT DIVISION: 0

## 2017-05-04 LAB — BPAM RBC
BLOOD PRODUCT EXPIRATION DATE: 201808022359
Blood Product Expiration Date: 201808082359
ISSUE DATE / TIME: 201807271331
ISSUE DATE / TIME: 201807290805
Unit Type and Rh: 6200
Unit Type and Rh: 6200

## 2017-05-04 LAB — CBC
HEMATOCRIT: 26.4 % — AB (ref 36.0–46.0)
Hemoglobin: 8.9 g/dL — ABNORMAL LOW (ref 12.0–15.0)
MCH: 32.1 pg (ref 26.0–34.0)
MCHC: 33.7 g/dL (ref 30.0–36.0)
MCV: 95.3 fL (ref 78.0–100.0)
PLATELETS: 233 10*3/uL (ref 150–400)
RBC: 2.77 MIL/uL — AB (ref 3.87–5.11)
RDW: 16 % — ABNORMAL HIGH (ref 11.5–15.5)
WBC: 8.4 10*3/uL (ref 4.0–10.5)

## 2017-05-04 MED ORDER — SENNOSIDES-DOCUSATE SODIUM 8.6-50 MG PO TABS: 1.0000 | ORAL_TABLET | Freq: Two times a day (BID) | ORAL | 0 refills | Status: AC | PRN

## 2017-05-04 NOTE — Clinical Social Work Note (Signed)
Clinical Social Work Assessment  Patient Details  Name: [name] MRN: [id number] Date of Birth: [birth year]  Date of referral:  05/04/17               Reason for consult:  Facility Placement                Permission sought to share information with:  Facility Art therapist granted to share information::  Yes, Verbal Permission Granted  Name::     children at bedside  Agency::  SNF-[facility], other SNf's  Relationship::     Contact Information:     Housing/Transportation Living arrangements for the past 2 months:  Single Family Home Source of Information:    Patient Interpreter Needed:  None Criminal Activity/Legal Involvement Pertinent to Current Situation/Hospitalization:  No - Comment as needed Significant Relationships:  Adult Children, Other Family Members Lives with:  Self Do you feel safe going back to the place where you live?  No Need for family participation in patient care:  No (Coment)  Care giving concerns:  Pt resided alone with home health per adult children. Pt not safe to return home at this time and will need short term rehab.  Social Worker assessment / plan:  CSW met with family/patient at bedside to discuss clinical teams recommendation to SNF at DC. CSW explained her role and SNF options/placement. Family wants patient to go to [facility]. CSW obtained permission to send out to local SNF's.  FL2 pending as Passr pending. Will need to verify social security number. No offers sent yet as FL2 and passr pending.  Employment status:  Retired Nurse, adult PT Recommendations:  [place] / Referral to community resources:  [place]  Patient/Family's Response to care:  Patient/family reports no issues with care.  Patient/Family's Understanding of and Emotional Response to Diagnosis, Current Treatment, and Prognosis:  Patient/family has good understanding of  diagnosis, current treatment and prognosis and are planning for long term after this short term rehab. CSW discussed the process and encouraged family to begin now. No issues or concerns identified.  Emotional Assessment Appearance:  Appears stated age Attitude/Demeanor/Rapport:   (Cooperative, but Resting) Affect (typically observed):  Accepting, Appropriate Orientation:  Oriented to Self, Oriented to Place, Oriented to  Time, Oriented to Situation Alcohol / Substance use:  Not Applicable Psych involvement (Current and /or in the community):  No (Comment)  Discharge Needs  Concerns to be addressed:  Care Coordination Readmission within the last 30 days:  Yes Current discharge risk:  Dependent with Mobility, Physical Impairment Barriers to Discharge:  No Barriers Identified   Normajean Baxter, LCSW 05/04/2017, 11:43 AM

## 2017-05-04 NOTE — Care Management Important Message (Signed)
Important Message  Patient Details  Name: [name] MRN: [id number] Date of Birth: 02-02-1930   Medicare Important Message Given:  Yes    Erenest Rasher, RN 05/04/2017, 11:22 AM

## 2017-05-04 NOTE — Social Work (Signed)
CSW called [facility] and left message with admission to see if they can offer bed.  PASSR pending as CSW faxed copy of medicare card to verify social security number.  CSW will continue to f/u.  Elissa Hefty, LCSW Clinical Social Worker [phone]

## 2017-05-04 NOTE — NC FL2 (Signed)
[place] LEVEL OF CARE SCREENING TOOL     IDENTIFICATION  Patient Name: [name] Birthdate: 05-13-1930 Sex: female Admission Date (Current Location): 05/01/2017  [facility] and [place] Number:  Herbalist and Address:  [facility], [place] [address], [place], [place] [address]      Provider Number: [id number]  Attending Physician Name and Address:  Janece Canterbury, MD  Relative Name and Phone Number:       Current Level of Care: Hospital Recommended Level of Care: [place] Prior Approval Number:    Date Approved/Denied: 05/04/17 PASRR Number: [id number]  Discharge Plan: SNF    Current Diagnoses: Patient Active Problem List   Diagnosis Date Noted  . Hip fracture ([place]) 05/01/2017  . Depression 05/01/2017  . Bronchiectasis without complication ([place]) 17/79/3903  . Protein-calorie malnutrition, severe 04/07/2017  . Pleural effusion   . Consolidation lung ([place]) left lower lobe 04/05/2017  . Loss of weight 04/05/2017  . Cough   . Left lower lobe pneumonia ([place]) 04/04/2017  . GERD (gastroesophageal reflux disease) 04/04/2017  . IBS (irritable bowel syndrome)   . Dysphagia 02/06/2017  . Osteoarthritis 02/06/2017    Orientation RESPIRATION BLADDER Height & Weight     Self, Time, Situation, Place  Normal Continent Weight: 109 lb (49.4 kg) Height:  4\' 9"  (144.8 cm)  BEHAVIORAL SYMPTOMS/MOOD NEUROLOGICAL BOWEL NUTRITION STATUS      Continent Diet (Regular with Thin Liquids)  AMBULATORY STATUS COMMUNICATION OF NEEDS Skin   Limited Assist Verbally Surgical wounds (Right Hip Incision)                       Personal Care Assistance Level of Assistance  Bathing, Feeding, Dressing Bathing Assistance: Limited assistance Feeding assistance: Limited assistance Dressing Assistance: Limited assistance     Functional Limitations Info  Sight, Hearing, Speech Sight Info: Adequate Hearing Info:  Adequate Speech Info: Adequate    SPECIAL CARE FACTORS FREQUENCY  PT (By licensed PT), OT (By licensed OT)     PT Frequency: 5x week OT Frequency: 2x week            Contractures Contractures Info: Not present    Additional Factors Info  Code Status, Allergies, Psychotropic Code Status Info: DNR Allergies Info: Cerefolin L-methylfolate-b12-b6-b2, Codeine, Lactose Intolerance (Gi) Psychotropic Info: Cymbalta         Current Medications (05/04/2017):  This is the current hospital active medication list Current Facility-Administered Medications  Medication Dose Route Frequency Provider Last Rate Last Dose  . 0.9 %  sodium chloride infusion   Intravenous Once Schorr, Rhetta Mura, NP      . acetaminophen (TYLENOL) tablet 650 mg  650 mg Oral Q6H PRN Nicholes Stairs, MD   650 mg at 05/04/17 0048   Or  . acetaminophen (TYLENOL) suppository 650 mg  650 mg Rectal Q6H PRN Nicholes Stairs, MD      . albuterol (PROVENTIL) (2.5 MG/3ML) 0.083% nebulizer solution 2.5 mg  2.5 mg Nebulization Q2H PRN Elwin Mocha, MD      . benzonatate (TESSALON) capsule 100 mg  100 mg Oral TID PRN Rondel Jumbo, PA-C      . bisacodyl (DULCOLAX) EC tablet 5 mg  5 mg Oral Daily PRN Rondel Jumbo, PA-C      . calcium carbonate (TUMS - dosed in mg elemental calcium) chewable tablet 400 mg of elemental calcium  2 tablet Oral Q4H PRN Short, Noah Delaine,  MD      . celecoxib (CELEBREX) capsule 100 mg  100 mg Oral BID Rondel Jumbo, PA-C   100 mg at 05/04/17 6270  . DULoxetine (CYMBALTA) DR capsule 60 mg  60 mg Oral QHS Rondel Jumbo, PA-C   60 mg at 05/03/17 2203  . enoxaparin (LOVENOX) injection 30 mg  30 mg Subcutaneous Q24H Nicholes Stairs, MD   30 mg at 05/04/17 [id number]  . feeding supplement (BOOST / RESOURCE BREEZE) liquid 1 Container  1 Container Oral TID BM Rondel Jumbo, PA-C   1 Container at 05/03/17 1359  . guaiFENesin (MUCINEX) 12 hr tablet 600 mg  600 mg Oral BID PRN Rondel Jumbo, PA-C      . HYDROcodone-acetaminophen (NORCO) 7.5-325 MG per tablet 1 tablet  1 tablet Oral Q4H PRN Rondel Jumbo, PA-C   1 tablet at 05/04/17 [phone]  . methocarbamol (ROBAXIN) tablet 500 mg  500 mg Oral Q6H PRN Nicholes Stairs, MD   500 mg at 05/04/17 8299   Or  . methocarbamol (ROBAXIN) 500 mg in dextrose 5 % 50 mL IVPB  500 mg Intravenous Q6H PRN Nicholes Stairs, MD      . metoCLOPramide [facility]) tablet 5-10 mg  5-10 mg Oral Q8H PRN Nicholes Stairs, MD       Or  . metoCLOPramide [facility]) injection 5-10 mg  5-10 mg Intravenous Q8H PRN Nicholes Stairs, MD      . morphine 4 MG/ML injection 1 mg  1 mg Intravenous Q2H PRN Rondel Jumbo, PA-C   1 mg at 05/04/17 0211  . ondansetron (ZOFRAN) tablet 4 mg  4 mg Oral Q6H PRN Nicholes Stairs, MD       Or  . ondansetron [facility]) injection 4 mg  4 mg Intravenous Q6H PRN Nicholes Stairs, MD      . pantoprazole (PROTONIX) EC tablet 40 mg  40 mg Oral Daily Rondel Jumbo, PA-C   40 mg at 05/04/17 3716  . polyethylene glycol (MIRALAX / GLYCOLAX) packet 34 g  34 g Oral QHS Rondel Jumbo, PA-C   34 g at 05/03/17 2203  . rOPINIRole (REQUIP) tablet 0.25 mg  0.25 mg Oral Daily PRN Rondel Jumbo, PA-C      . senna (SENOKOT) tablet 8.6 mg  1 tablet Oral BID Nicholes Stairs, MD   8.6 mg at 05/04/17 9678  . senna-docusate (Senokot-S) tablet 1 tablet  1 tablet Oral QHS PRN Rondel Jumbo, PA-C      . sodium phosphate (FLEET) 7-19 GM/118ML enema 1 enema  1 enema Rectal Once PRN Rondel Jumbo, PA-C      . tetrahydrozoline 0.05 % ophthalmic solution 1 drop  1 drop Both Eyes Daily PRN Rondel Jumbo, PA-C      . triazolam (HALCION) tablet 0.25 mg  0.25 mg Oral QHS Rondel Jumbo, PA-C   0.25 mg at 05/03/17 2203     Discharge Medications: Please see discharge summary for a list of discharge medications.  Relevant Imaging Results:  Relevant Lab Results:   Additional Information SSN [id number]  Normajean Baxter, LCSW

## 2017-05-04 NOTE — Progress Notes (Signed)
Patient ID: [name], female   DOB: 1930/07/31, 83 y.o.   MRN: [id number] Subjective: 3 Days Post-Op Procedure(s) (LRB): INTRAMEDULLARY (IM) NAIL INTERTROCHANTRIC (Right)    Patient reports pain as mild to moderate but controlled.  She has chronic pain issues.    Objective:   VITALS:   Vitals:   05/03/17 2055 05/04/17 0500  BP: 107/70 111/67  Pulse: (!) 103 (!) 103  Resp: 17 16  Temp: 98 F (36.7 C) (!) 97.3 F (36.3 C)    Neurovascular intact Incision: dressing C/D/I  +SILT dp/sp/sur/saph/TN + TA/GSC/FHL/EHL 2+ DP  LABS  Recent Labs  05/03/17 0300 05/03/17 1309 05/03/17 1503 05/04/17 0310  HGB 6.7* 8.3* 9.6* 8.9*  HCT 20.1* 24.8* 29.1* 26.4*  WBC 9.3 8.1  --  8.4  PLT 229 217  --  233     Recent Labs  05/02/17 0354  NA 134*  K 4.6  BUN 24*  CREATININE 0.83  GLUCOSE 128*    No results for input(s): LABPT, INR in the last 72 hours.   Assessment/Plan: 3 Days Post-Op Procedure(s) (LRB): INTRAMEDULLARY (IM) NAIL INTERTROCHANTRIC (Right)   Advance diet Up with therapy  Discharge pending further therapy assessment Pain control DVT prophylaxis with bid asa and SCDs Acute blood loss anemia responded well to transfusion Likely to SNF

## 2017-05-04 NOTE — Social Work (Addendum)
Clinical Social Worker facilitated patient discharge including contacting patient family and facility to confirm patient discharge plans.  Clinical information faxed to facility and family agreeable with plan.    CSW arranged ambulance transport via [facility] to [facility].    RN to call [phone] to give report prior to discharge. Pt going to [facility].  Clinical Social Worker will sign off for now as social work intervention is no longer needed. Please consult [place] again if new need arises.  Elissa Hefty, LCSW Clinical Social Worker [phone]

## 2017-05-04 NOTE — Social Work (Addendum)
PASSR obtained.  CSW called [facility] again and has been unsuccessful at reaching a person to discuss bed offer. Pt DC today.  CSW discussed with family and they still want [facility]. CSW advised that she has been unsuccessful at reaching a staff person.  Family indicated that she spoke with [place] at [facility]. CSW contacted [facility] and she indicated that CSW would need to speak with Truman Hayward at at [facility]. CSW called Truman Hayward and left msg.  CSW also transferred to  marketing & financial staff Manuela Schwartz at [phone] and left msg.  CSW received call back from [place] at [facility] advising that they are not in network with [employer].  CSW went to discuss with daughter and she was not comfortable making decision for placement.  CSW then called son Konrad Dolores who selected [facility]. CSW contacted admission staff who will accept and is reviewing DC summary.  Elissa Hefty, LCSW Clinical Social Worker [phone]

## 2017-05-04 NOTE — Progress Notes (Signed)
Physical Therapy Treatment Patient Details Name: [name] MRN: [id number] DOB: [birth year] Today's Date: 05/04/2017    History of Present Illness Pt is an 83 yo female admitted through ED on 05/01/17 following a fall resulting in a R hip fx. Pt underwent an IM nail on 05/01/17. PMH significant for IBS, anxiety/depression, chronic dysphagia, chronic Pain, penumonia 04/07/17.     PT Comments    Pt remains to require assist for all mobility and ADLs. Pt with limited R LE WBing tolerance due to pain. Pt remains approriate for ST-SNF upon d/c.   Follow Up Recommendations  SNF     Equipment Recommendations  Wheelchair (measurements PT);Wheelchair cushion (measurements PT)    Recommendations for Other Services       Precautions / Restrictions Precautions Precautions: Fall Restrictions Weight Bearing Restrictions: Yes RLE Weight Bearing: Weight bearing as tolerated    Mobility  Bed Mobility Overal bed mobility: Needs Assistance Bed Mobility: Supine to Sit     Supine to sit: Mod assist     General bed mobility comments: modA for trunk elevation and to bring hips to EOB  Transfers Overall transfer level: Needs assistance Equipment used: Rolling walker (2 wheeled) Transfers: Sit to/from Stand Sit to Stand: Min assist         General transfer comment: minA to steady pt during transition of hands from bed to RW  Ambulation/Gait Ambulation/Gait assistance: Min assist Ambulation Distance (Feet): 10 Feet Assistive device: Rolling walker (2 wheeled) Gait Pattern/deviations: Step-to pattern;Decreased step length - left;Decreased stance time - right;Antalgic Gait velocity: decreased Gait velocity interpretation: Below normal speed for age/gender General Gait Details: minimal step with R LE, very antalgic. quickly fatigued, progressive trunk flexion   Stairs            Wheelchair Mobility    Modified Rankin (Stroke Patients Only)       Balance Overall  balance assessment: History of Falls                                          Cognition Arousal/Alertness: Awake/alert Behavior During Therapy: WFL for tasks assessed/performed Overall Cognitive Status: Within Functional Limits for tasks assessed                                        Exercises General Exercises - Lower Extremity Ankle Circles/Pumps: AROM;Both;10 reps Long Arc Quad: AROM;Both;10 reps;Supine Hip Flexion/Marching: AROM;Both;10 reps;Seated    General Comments        Pertinent Vitals/Pain Pain Assessment: Faces Faces Pain Scale: Hurts even more Pain Location: R hip with WBing Pain Descriptors / Indicators: Aching;Grimacing;Guarding;Sore Pain Intervention(s): Monitored during session    Home Living                      Prior Function            PT Goals (current goals can now be found in the care plan section) Progress towards PT goals: Progressing toward goals    Frequency    Min 3X/week      PT Plan Discharge plan needs to be updated    Co-evaluation              AM-PAC PT "6 Clicks" Daily Activity  Outcome Measure  Difficulty turning over in bed (  including adjusting bedclothes, sheets and blankets)?: Total Difficulty moving from lying on back to sitting on the side of the bed? : Total Difficulty sitting down on and standing up from a chair with arms (e.g., wheelchair, bedside commode, etc,.)?: Total Help needed moving to and from a bed to chair (including a wheelchair)?: A Lot Help needed walking in hospital room?: A Lot Help needed climbing 3-5 steps with a railing? : Total 6 Click Score: 8    End of Session Equipment Utilized During Treatment: Gait belt       PT Visit Diagnosis: Unsteadiness on feet (R26.81);Muscle weakness (generalized) (M62.81);Pain;Difficulty in walking, not elsewhere classified (R26.2) Pain - Right/Left: Right Pain - part of body: Hip     Time: 3716-9678 PT  Time Calculation (min) (ACUTE ONLY): 17 min  Charges:  $Gait Training: 8-22 mins                    G Codes:       Kittie Plater, PT, DPT Pager #: [phone] Office #: [phone]    [place] 05/04/2017, 2:34 PM

## 2017-05-04 NOTE — Progress Notes (Signed)
Attempted to call report to [place]. Transferred to RN but no answer. Will try again.

## 2017-05-04 NOTE — Clinical Social Work Placement (Signed)
   CLINICAL SOCIAL WORK PLACEMENT  NOTE  Date:  05/04/2017  Patient Details  Name: [name] MRN: [id number] Date of Birth: [birth year]  Clinical Social Work is seeking post-discharge placement for this patient at the [place] level of care (*CSW will initial, date and re-position this form in  chart as items are completed):  Yes   Patient/family provided with [facility] Work Department's list of facilities offering this level of care within the geographic area requested by the patient (or if unable, by the patient's family).  Yes   Patient/family informed of their freedom to choose among providers that offer the needed level of care, that participate in Medicare, Medicaid or managed care program needed by the patient, have an available bed and are willing to accept the patient.  Yes   Patient/family informed of [facility]'s ownership interest in [facility] and [facility], as well as of the fact that they are under no obligation to receive care at these facilities.  PASRR submitted to EDS on       PASRR number received on 05/04/17     Existing PASRR number confirmed on       FL2 transmitted to all facilities in geographic area requested by pt/family on 05/03/17     FL2 transmitted to all facilities within larger geographic area on       Patient informed that his/her managed care company has contracts with or will negotiate with certain facilities, including the following:        Yes   Patient/family informed of bed offers received.  Patient chooses bed at [facility]     Physician recommends and patient chooses bed at      Patient to be transferred to [facility] on 05/04/17.  Patient to be transferred to facility by [facility]     Patient family notified on 05/04/17 of transfer.  Name of family member notified:  daughter and son at bedside     PHYSICIAN Please prepare priority discharge summary, including medications, Please  prepare prescriptions, Please sign DNR, Please sign FL2     Additional Comment:    _______________________________________________ Normajean Baxter, LCSW 05/04/2017, 3:39 PM

## 2017-05-04 NOTE — Discharge Summary (Signed)
Physician Discharge Summary  [name]:[id number] DOB: 1929-12-16 DOA: 05/01/2017  PCP: Orpah Melter, MD  Admit date: 05/01/2017 Discharge date: 05/04/2017  Admitted From: home  Disposition:  SNF  Recommendations for Outpatient Follow-up:  1. Orthopedic surgery, Dr. Stann Mainland in 2 weeks 2. Please obtain BMP/CBC in one week to follow up anemia 3. Twice daily aspirin for the next 4 weeks, then transition to once daily on august 24th.   4. F/u with 2-3 weeks with Dr. Milinda Hirschfeld, Pulmonology.    Home Health:  Ongoing PT/OT  Equipment/Devices:  none  Discharge Condition:  Stable, improved CODE STATUS:  DNR  Diet recommendation:  Regular with supplements   Brief/Interim Summary:  [name] a 83 y.o.femalewith medical history significant for IBS,anxiety/depression, chronic dysphagia 3 (thin liquids and asp.precautions), chronic pain due to arthritis, recent admission for pneumonia d/c 7/3, presented to the ED after slipping and falling in her bathroom. She struck her right hip and felt it pop.  She was found to have a closed displaced intertrochanteric right femoral fracture.  She underwent intramedullary implant insertion by Dr. Stann Mainland on 7/27.  Postoperative course complicated by acute blood loss anemia requiring 1 unit blood transfusion on 7/29.  Discharge Diagnoses:  Active Problems:   Dysphagia   Osteoarthritis   IBS (irritable bowel syndrome)   GERD (gastroesophageal reflux disease)   Consolidation lung (HCC) left lower lobe   Loss of weight   Pleural effusion   Protein-calorie malnutrition, severe   Bronchiectasis without complication (HCC)   Hip fracture (HCC)   Depression  Closed displaced intertrochanteric R femoral Fracture in a patient with a history of arthritis s/p IM nail on 7/27 -  given lovenox for DVT proph in hospital, Rx given for asa 81mg  BID at discharge -  WBAT RLE -  F/u in 2 weeks with orthopedic surgery for staple removal -  PT/OT  recommended SNF  Depression  Continued home Cymbalta and Halcion  GERD/ Dysphagia 3. Severe protein malnutrition. Esophagram 5/1 with esophageal dysmotility, no masses.  Has gained some weight. -  Continued supplements  Anemia of chronic disease hemoglobin 8.9, with a history of normocyticanemia, chronic (BL 8-10). Trended down slightly but no need for blood transfusion at this time. -  Transfused 1 unit PRBC and repeat hematocrit this afternoon, additional transfusions if needed  Restless leg syndrome Continued Requip in am   Known LLL mass vs. Consolidation and B effusions, seen by Pulmonary on 7/13 F/u as outpatient with Pulmonology   Discharge Instructions  Discharge Instructions    Diet general    Complete by:  As directed    Increase activity slowly    Complete by:  As directed        Medication List    TAKE these medications   aspirin EC 81 MG tablet Take 1 tablet (81 mg total) by mouth 2 (two) times daily.   benzonatate 100 MG capsule Commonly known as:  TESSALON Take 100 mg by mouth 3 (three) times daily as needed for cough.   celecoxib 100 MG capsule Commonly known as:  CELEBREX Take 1 capsule (100 mg total) by mouth 2 (two) times daily.   DULoxetine 60 MG capsule Commonly known as:  CYMBALTA Take 60 mg by mouth at bedtime.   feeding supplement Liqd Take 1 Container by mouth 3 (three) times daily between meals.   FLUTTER Devi Blow into device 3 times in a row twice daily.   HYDROcodone-acetaminophen 7.5-325 MG tablet Commonly known as:  NORCO Take 1-2 tablets by mouth every 6 (six) hours as needed (pain). What changed:  how much to take  when to take this   hydrocortisone 2.5 % cream Apply 1 application topically 2 (two) times daily as needed (rash).   pantoprazole 40 MG tablet Commonly known as:  PROTONIX Take 40 mg by mouth daily.   polyethylene glycol packet Commonly known as:  MIRALAX / GLYCOLAX Take 34 g by mouth at bedtime.  Mix 2 capfuls (34 grams) in 6-8 oz liquid and drink   rOPINIRole 0.25 MG tablet Commonly known as:  REQUIP Take 1 tablet (0.25 mg total) by mouth daily as needed (severe restless legs).   senna-docusate 8.6-50 MG tablet Commonly known as:  Senokot-S Take 1 tablet by mouth 2 (two) times daily as needed for mild constipation.   triazolam 0.25 MG tablet Commonly known as:  HALCION Take 0.25 mg by mouth at bedtime.   VISINE OP Place 1 drop into both eyes daily as needed (dry eyes/irritation).      Follow-up Information    Orpah Melter, MD Follow up.   Specialty:  Family Medicine Contact information: [place] [address] [phone]        Nicholes Stairs, MD Follow up in 2 week(s).   Specialty:  Orthopedic Surgery Why:  For suture removal, For wound re-check Contact information: [address] [place] [address] [phone]          Allergies  Allergen Reactions  . Cerefolin [L-Methylfolate-B12-B6-B2] Nausea Only  . Codeine Nausea And Vomiting and Other (See Comments)    dizziness  . Lactose Intolerance (Gi) Other (See Comments)    Constipation and bloating    Consultations:  Orthopedic surgery, Dr. Stann Mainland  Procedures/Studies: Dg Chest 1 View  Result Date: 05/01/2017 CLINICAL DATA:  Pain after falling 1 day ago EXAM: CHEST 1 VIEW COMPARISON:  03/03/2017 FINDINGS: Diffuse interstitial prominence, likely interstitial edema. Perihilar airspace opacities likely represent alveolar edema. Small pleural effusions, right greater than left. Moderate cardiomegaly. IMPRESSION: The findings likely represent congestive heart failure with interstitial and alveolar edema. Electronically Signed   By: Andreas Newport M.D.   On: 05/01/2017 05:27   Ct Head Wo Contrast  Result Date: 05/01/2017 CLINICAL DATA:  Headache.  Fell during the night. EXAM: CT HEAD WITHOUT CONTRAST CT CERVICAL SPINE WITHOUT CONTRAST TECHNIQUE:  Multidetector CT imaging of the head and cervical spine was performed following the standard protocol without intravenous contrast. Multiplanar CT image reconstructions of the cervical spine were also generated. COMPARISON:  12/03/2005 FINDINGS: CT HEAD FINDINGS Brain: There is no intracranial hemorrhage, mass or evidence of acute infarction. There is mild generalized atrophy. There is mild chronic microvascular ischemic change. There is no significant extra-axial fluid collection. No acute intracranial findings are evident. Vascular: No hyperdense vessel or unexpected calcification. Skull: Normal. Negative for fracture or focal lesion. Sinuses/Orbits: No acute finding. Other: None. CT CERVICAL SPINE FINDINGS Alignment: Normal. Skull base and vertebrae: No acute fracture. No primary bone lesion or focal pathologic process. Soft tissues and spinal canal: No prevertebral fluid or swelling. No visible canal hematoma. Disc levels: Moderately severe degenerative cervical disc disease at C5-6 and C6-7. Prominent facet arthritis, greatest at C4-5. Grade 1 degenerative spondylolisthesis at C4-5. Upper chest: Bilateral pleural effusions. Other: None IMPRESSION: 1. No acute intracranial findings. There is mild generalized atrophy and chronic appearing white matter hypodensities which likely represent small vessel ischemic disease. 2. Negative for acute cervical spine  fracture. 3. Bilateral pleural effusions. Electronically Signed   By: Andreas Newport M.D.   On: 05/01/2017 05:49   Ct Cervical Spine Wo Contrast  Result Date: 05/01/2017 CLINICAL DATA:  Headache.  Fell during the night. EXAM: CT HEAD WITHOUT CONTRAST CT CERVICAL SPINE WITHOUT CONTRAST TECHNIQUE: Multidetector CT imaging of the head and cervical spine was performed following the standard protocol without intravenous contrast. Multiplanar CT image reconstructions of the cervical spine were also generated. COMPARISON:  12/03/2005 FINDINGS: CT HEAD FINDINGS  Brain: There is no intracranial hemorrhage, mass or evidence of acute infarction. There is mild generalized atrophy. There is mild chronic microvascular ischemic change. There is no significant extra-axial fluid collection. No acute intracranial findings are evident. Vascular: No hyperdense vessel or unexpected calcification. Skull: Normal. Negative for fracture or focal lesion. Sinuses/Orbits: No acute finding. Other: None. CT CERVICAL SPINE FINDINGS Alignment: Normal. Skull base and vertebrae: No acute fracture. No primary bone lesion or focal pathologic process. Soft tissues and spinal canal: No prevertebral fluid or swelling. No visible canal hematoma. Disc levels: Moderately severe degenerative cervical disc disease at C5-6 and C6-7. Prominent facet arthritis, greatest at C4-5. Grade 1 degenerative spondylolisthesis at C4-5. Upper chest: Bilateral pleural effusions. Other: None IMPRESSION: 1. No acute intracranial findings. There is mild generalized atrophy and chronic appearing white matter hypodensities which likely represent small vessel ischemic disease. 2. Negative for acute cervical spine fracture. 3. Bilateral pleural effusions. Electronically Signed   By: Andreas Newport M.D.   On: 05/01/2017 05:49   Chest Portable 1 View  Result Date: 05/02/2017 CLINICAL DATA:  Lung consolidation.  Effusions EXAM: PORTABLE CHEST 1 VIEW COMPARISON:  05/01/2017 FINDINGS: Cardiomegaly with vascular congestion. Small bilateral effusions with bibasilar opacities, likely atelectasis. Improving interstitial prominence compatible with improving edema. IMPRESSION: Improving CHF pattern. Continued small effusions and bibasilar atelectasis. Electronically Signed   By: Rolm Baptise M.D.   On: 05/02/2017 08:09   Dg Humerus Right  Result Date: 05/01/2017 CLINICAL DATA:  Right upper extremity pain after falling 1 day ago EXAM: RIGHT HUMERUS - 2+ VIEW COMPARISON:  None. FINDINGS: Negative for acute fracture. Severe chronic  right shoulder arthropathy. No discrete bone lesion. No acute soft tissue abnormality. IMPRESSION: Negative for acute fracture or dislocation. Severe chronic right shoulder arthropathy. Electronically Signed   By: Andreas Newport M.D.   On: 05/01/2017 05:26   Dg C-arm 1-60 Min  Result Date: 05/01/2017 CLINICAL DATA:  Fracture.  Pain. EXAM: DG C-ARM 61-120 MIN; RIGHT FEMUR 2 VIEWS COMPARISON:  Plain films 05/01/2017. FINDINGS: Compression screw with intramedullary rod fixation across an intertrochanteric fracture. Improved position and alignment. Lesser trochanter is sheared off. IMPRESSION: Satisfactory position and alignment. Electronically Signed   By: Staci Righter M.D.   On: 05/01/2017 14:35   Ir US Chest  Result Date: 04/07/2017 CLINICAL DATA:  Right pleural effusion by CT. EXAM: CHEST ULTRASOUND COMPARISON:  CT of the chest on 04/03/2017 FINDINGS: In preparation for possible thoracentesis, ultrasound was performed of the chest bilaterally from a posterior position with the patient sitting upright. There is only a small amount of right pleural fluid. No significant left pleural fluid is identified. Thoracentesis was not performed. IMPRESSION: Ultrasound demonstrates only a small amount of right pleural fluid. Thoracentesis was not performed. Electronically Signed   By: Aletta Edouard M.D.   On: 04/07/2017 16:56   Dg Hip Unilat  With Pelvis 2-3 Views Right  Result Date: 05/01/2017 CLINICAL DATA:  Right hip pain for 1 day after falling  EXAM: DG HIP (WITH OR WITHOUT PELVIS) 2-3V RIGHT COMPARISON:  None. FINDINGS: There is an intertrochanteric right hip fracture with moderate comminution and mild varus angulation. No dislocation. No radiographic evidence of a pathologic basis for the fracture. Bony pelvis is intact. Pubic symphysis and sacroiliac joints appear intact. IMPRESSION: Intertrochanteric right hip fracture Electronically Signed   By: Andreas Newport M.D.   On: 05/01/2017 04:31   Dg  Femur, Min 2 Views Right  Result Date: 05/01/2017 CLINICAL DATA:  Fracture.  Pain. EXAM: DG C-ARM 61-120 MIN; RIGHT FEMUR 2 VIEWS COMPARISON:  Plain films 05/01/2017. FINDINGS: Compression screw with intramedullary rod fixation across an intertrochanteric fracture. Improved position and alignment. Lesser trochanter is sheared off. IMPRESSION: Satisfactory position and alignment. Electronically Signed   By: Staci Righter M.D.   On: 05/01/2017 14:35   Dg Femur, Min 2 Views Right  Result Date: 05/01/2017 CLINICAL DATA:  Right hip fracture. EXAM: RIGHT FEMUR 2 VIEWS COMPARISON:  Pelvic x-rays from same date. FINDINGS: Again seen is a comminuted intertrochanteric right femur fracture with mild varus angulation. No additional fracture seen. Status post right total knee arthroplasty. Osteopenia. IMPRESSION: Comminuted right intertrochanteric femur fracture with mild varus angulation. Electronically Signed   By: Titus Dubin M.D.   On: 05/01/2017 08:25   Subjective: Denies dyspnea, SOB, dysuria.  Having shoulder pains and ongoing pain in her left hip from surgery, but has chronic pain.    Discharge Exam: Vitals:   05/03/17 2055 05/04/17 0500  BP: 107/70 111/67  Pulse: (!) 103 (!) 103  Resp: 17 16  Temp: 98 F (36.7 C) (!) 97.3 F (36.3 C)   Vitals:   05/03/17 1156 05/03/17 1500 05/03/17 2055 05/04/17 0500  BP: (!) 100/59 (!) 86/62 107/70 111/67  Pulse: 94 89 (!) 103 (!) 103  Resp: 17 17 17 16   Temp: 97.8 F (36.6 C) 98.1 F (36.7 C) 98 F (36.7 C) (!) 97.3 F (36.3 C)  TempSrc: Oral Oral Oral Oral  SpO2: 95% 100% 93% 92%  Weight:      Height:        General: Cachectic female, no acute distress  Cardiovascular:  Regular rate and rhythm, 2/6 systolic murmur at the left sternal border, warm extremities no rubs, no gallops Respiratory: Rales at the bilateral bases, no wheezes or rhonchi, no increased work of breathing  Abdominal: NABS, soft, nondistended, nontender Extremities: Normal  tone and bulk, moderate swelling of the right thigh with no right knee effusion. Her dressing is clean, dry, intact with no palpable hematoma and minimal ecchymoses Neuro:  2+ pedal pulses, sensation intact to light touch bilateral feet, grossly moves all extremities    The results of significant diagnostics from this hospitalization (including imaging, microbiology, ancillary and laboratory) are listed below for reference.     Microbiology: Recent Results (from the past 240 hour(s))  Surgical PCR screen     Status: None   Collection Time: 05/01/17 11:25 AM  Result Value Ref Range Status   MRSA, PCR NEGATIVE NEGATIVE Final   Staphylococcus aureus NEGATIVE NEGATIVE Final    Comment:        The Xpert SA Assay (FDA approved for NASAL specimens in patients over 73 years of age), is one component of a comprehensive surveillance program.  Test performance has been validated by [facility] for patients greater than or equal to 76 year old. It is not intended to diagnose infection nor to guide or monitor treatment.      Labs:  BNP (last 3 results) No results for input(s): BNP in the last 8760 hours. Basic Metabolic Panel:  Recent Labs Lab 05/01/17 0505 05/02/17 0354  NA 138 134*  K 4.1 4.6  CL 101 101  CO2 27 25  GLUCOSE 106* 128*  BUN 24* 24*  CREATININE 0.86 0.83  CALCIUM 10.1 9.1   Liver Function Tests: No results for input(s): AST, ALT, ALKPHOS, BILITOT, PROT, ALBUMIN in the last 168 hours. No results for input(s): LIPASE, AMYLASE in the last 168 hours. No results for input(s): AMMONIA in the last 168 hours. CBC:  Recent Labs Lab 05/01/17 0505 05/02/17 0354 05/03/17 0300 05/03/17 1309 05/03/17 1503 05/04/17 0310  WBC 8.7 8.0 9.3 8.1  --  8.4  NEUTROABS 6.2  --   --   --   --   --   HGB 8.9* 8.1* 6.7* 8.3* 9.6* 8.9*  HCT 27.5* 24.4* 20.1* 24.8* 29.1* 26.4*  MCV 100.4* 95.7 97.1 96.1  --  95.3  PLT 357 272 229 217  --  233   Cardiac Enzymes: No results  for input(s): CKTOTAL, CKMB, CKMBINDEX, TROPONINI in the last 168 hours. BNP: Invalid input(s): POCBNP CBG: No results for input(s): GLUCAP in the last 168 hours. D-Dimer No results for input(s): DDIMER in the last 72 hours. Hgb A1c No results for input(s): HGBA1C in the last 72 hours. Lipid Profile No results for input(s): CHOL, HDL, LDLCALC, TRIG, CHOLHDL, LDLDIRECT in the last 72 hours. Thyroid function studies No results for input(s): TSH, T4TOTAL, T3FREE, THYROIDAB in the last 72 hours.  Invalid input(s): FREET3 Anemia work up No results for input(s): VITAMINB12, FOLATE, FERRITIN, TIBC, IRON, RETICCTPCT in the last 72 hours. Urinalysis    Component Value Date/Time   COLORURINE YELLOW 05/02/2017 1041   APPEARANCEUR CLEAR 05/02/2017 1041   LABSPEC 1.018 05/02/2017 1041   PHURINE 5.0 05/02/2017 1041   GLUCOSEU NEGATIVE 05/02/2017 1041   HGBUR NEGATIVE 05/02/2017 [place] 05/02/2017 [place] 05/02/2017 1041   PROTEINUR NEGATIVE 05/02/2017 1041   NITRITE NEGATIVE 05/02/2017 1041   LEUKOCYTESUR NEGATIVE 05/02/2017 1041   Sepsis Labs Invalid input(s): PROCALCITONIN,  WBC,  LACTICIDVEN   Time coordinating discharge: Over 30 minutes  SIGNED:   Janece Canterbury, MD  [facility] 05/04/2017, 10:59 AM Pager   If 7PM-7AM, please contact night-coverage [email or web address] Password [email or web address]

## 2017-05-05 ENCOUNTER — Encounter (HOSPITAL_COMMUNITY): Payer: Self-pay | Admitting: Orthopedic Surgery

## 2017-05-18 ENCOUNTER — Ambulatory Visit: Payer: Medicare Other | Admitting: Acute Care

## 2017-06-03 ENCOUNTER — Institutional Professional Consult (permissible substitution): Payer: Medicare Other | Admitting: Pulmonary Disease

## 2017-06-24 ENCOUNTER — Emergency Department (HOSPITAL_COMMUNITY): Payer: Medicare Other

## 2017-06-24 ENCOUNTER — Encounter (HOSPITAL_COMMUNITY): Payer: Self-pay | Admitting: *Deleted

## 2017-06-24 ENCOUNTER — Emergency Department (HOSPITAL_COMMUNITY)
Admission: EM | Admit: 2017-06-24 | Discharge: 2017-06-24 | Disposition: A | Discharge status: Home / Self Care | Payer: Medicare Other | Attending: Physician Assistant | Admitting: Physician Assistant

## 2017-06-24 DIAGNOSIS — Z79899 Other long term (current) drug therapy: Secondary | ICD-10-CM | POA: Diagnosis not present

## 2017-06-24 DIAGNOSIS — I10 Essential (primary) hypertension: Secondary | ICD-10-CM | POA: Diagnosis not present

## 2017-06-24 DIAGNOSIS — R1032 Left lower quadrant pain: Secondary | ICD-10-CM | POA: Diagnosis present

## 2017-06-24 DIAGNOSIS — K529 Noninfective gastroenteritis and colitis, unspecified: Secondary | ICD-10-CM

## 2017-06-24 DIAGNOSIS — K5289 Other specified noninfective gastroenteritis and colitis: Secondary | ICD-10-CM | POA: Diagnosis not present

## 2017-06-24 LAB — COMPREHENSIVE METABOLIC PANEL
ALK PHOS: 104 U/L (ref 38–126)
ALT: 8 U/L — AB (ref 14–54)
AST: 18 U/L (ref 15–41)
Albumin: 3.7 g/dL (ref 3.5–5.0)
Anion gap: 3 — ABNORMAL LOW (ref 5–15)
BUN: 16 mg/dL (ref 6–20)
CHLORIDE: 101 mmol/L (ref 101–111)
CO2: 35 mmol/L — AB (ref 22–32)
Calcium: 9.9 mg/dL (ref 8.9–10.3)
Creatinine, Ser: 0.89 mg/dL (ref 0.44–1.00)
GFR calc Af Amer: >60 mL/min (ref >60)
GFR, EST NON AFRICAN AMERICAN: 57 mL/min — AB (ref >60)
Glucose, Bld: 96 mg/dL (ref 65–99)
Potassium: 4.4 mmol/L (ref 3.5–5.1)
SODIUM: 139 mmol/L (ref 135–145)
Total Bilirubin: 0.6 mg/dL (ref 0.3–1.2)
Total Protein: 6.3 g/dL — ABNORMAL LOW (ref 6.5–8.1)

## 2017-06-24 LAB — URINALYSIS, ROUTINE W REFLEX MICROSCOPIC
BILIRUBIN URINE: NEGATIVE
Glucose, UA: NEGATIVE mg/dL
Hgb urine dipstick: NEGATIVE
KETONES UR: NEGATIVE mg/dL
Leukocytes, UA: NEGATIVE
Nitrite: NEGATIVE
PROTEIN: 30 mg/dL — AB
RBC / HPF: NONE SEEN RBC/hpf (ref 0–5)
Specific Gravity, Urine: 1.016 (ref 1.005–1.030)
pH: 7 (ref 5.0–8.0)

## 2017-06-24 LAB — CBC
HEMATOCRIT: 32.2 % — AB (ref 36.0–46.0)
HEMOGLOBIN: 10.3 g/dL — AB (ref 12.0–15.0)
MCH: 32.7 pg (ref 26.0–34.0)
MCHC: 32 g/dL (ref 30.0–36.0)
MCV: 102.2 fL — AB (ref 78.0–100.0)
Platelets: 399 10*3/uL (ref 150–400)
RBC: 3.15 MIL/uL — AB (ref 3.87–5.11)
RDW: 14.2 % (ref 11.5–15.5)
WBC: 6.2 10*3/uL (ref 4.0–10.5)

## 2017-06-24 LAB — LIPASE, BLOOD: LIPASE: 22 U/L (ref 11–51)

## 2017-06-24 MED ORDER — GLYCERIN (ADULT) 2 G RE SUPP: 1.0000 | RECTAL | 0 refills | Status: AC | PRN

## 2017-06-24 MED ORDER — ONDANSETRON 4 MG PO TBDP: 4.0000 mg | ORAL_TABLET | Freq: Three times a day (TID) | ORAL | 0 refills | Status: DC | PRN

## 2017-06-24 MED ORDER — AMOXICILLIN-POT CLAVULANATE 875-125 MG PO TABS
1.0000 | ORAL_TABLET | Freq: Once | ORAL | Status: AC
  Administered 2017-06-24: 1 via ORAL
  Filled 2017-06-24: qty 1

## 2017-06-24 MED ORDER — GLYCERIN (LAXATIVE) 2.1 G RE SUPP: 1.0000 | Freq: Once | RECTAL | Status: DC

## 2017-06-24 MED ORDER — IOPAMIDOL (ISOVUE-300) INJECTION 61%
INTRAVENOUS | Status: AC
  Administered 2017-06-24: 100 mL
  Filled 2017-06-24: qty 100

## 2017-06-24 MED ORDER — ONDANSETRON HCL 4 MG PO TABS: 4.0000 mg | ORAL_TABLET | Freq: Every day | ORAL | 0 refills | Status: DC | PRN

## 2017-06-24 MED ORDER — AMOXICILLIN-POT CLAVULANATE 875-125 MG PO TABS: 1.0000 | ORAL_TABLET | Freq: Two times a day (BID) | ORAL | 0 refills | Status: DC

## 2017-06-24 NOTE — Discharge Instructions (Signed)
Please take the Augmentin I have prescribed to you. This is likely the cause of your liquid stools. Please follow-up with your gastroenterologist in the next 3 days. I have also prescribed some suppositories if you need them in the future. Please take Zofran as needed after having nausea. Again if you start to develop worsening symptoms and feel like you not improving in the next couple days you can always return to the ED for further evaluation.

## 2017-06-24 NOTE — ED Notes (Signed)
ED Provider at bedside. 

## 2017-06-24 NOTE — ED Triage Notes (Signed)
Pt in c/o mid upper abd pain with liquid stools with hx of IBS, pts GI MD sent here for eval for blockage, last normal BM x 3 days ago per pt, pt reports x 1 emesis today, A&O x4

## 2017-06-24 NOTE — ED Notes (Signed)
Patient transported to CT 

## 2017-06-24 NOTE — ED Provider Notes (Signed)
[facility] DEPT Provider Note   CSN: [id number] Arrival date & time: 06/24/17  1146     History   Chief Complaint Chief Complaint  Patient presents with  . Abdominal Pain    HPI [name] is a 83 y.o. female.  HPI   Patient with history of IBS sent over from GI for concern for blockage. She states that she's had a history of constipation. She uses MiraLAX,Senokot and milk of magnesia. She states she has had constipation. Most recently patient has had bowel 1 week of bowel movements which are not her normal. She indicates that for the last 3 days she's been having liquid bowel movements. This morning she states that she threw up, slightly white liquid. Patient states that over the past few days has been eating normal. Today she only had some crackers and fluids. Patient states that today she did have some abdominal pain on the left lower quadrant however after taking her pain medication and is now resolved. Patient has had history of abdominal distention associated with her constipation, current distentionis similar to in the past. She takes Norco regularly for her osteoarthritis. No abdominal surgical history.  Past Medical History:  Diagnosis Date  . Arthritis   . Bronchiectasis ([place])   . Hypertension   . IBS (irritable bowel syndrome)   . Pneumonia     Patient Active Problem List   Diagnosis Date Noted  . Hip fracture ([place]) 05/01/2017  . Depression 05/01/2017  . Bronchiectasis without complication ([place]) 81/85/6314  . Protein-calorie malnutrition, severe 04/07/2017  . Pleural effusion   . Consolidation lung ([place]) left lower lobe 04/05/2017  . Loss of weight 04/05/2017  . Cough   . Left lower lobe pneumonia ([place]) 04/04/2017  . GERD (gastroesophageal reflux disease) 04/04/2017  . IBS (irritable bowel syndrome)   . Dysphagia 02/06/2017  . Osteoarthritis 02/06/2017    Past Surgical History:  Procedure Laterality Date  . INTRAMEDULLARY (IM) NAIL  INTERTROCHANTERIC Right 05/01/2017   Procedure: INTRAMEDULLARY (IM) NAIL INTERTROCHANTRIC;  Surgeon: Nicholes Stairs, MD;  Location: [facility];  Service: Orthopedics;  Laterality: Right;    OB History    No data available       Home Medications    Prior to Admission medications   Medication Sig Start Date End Date Taking? Authorizing Provider  celecoxib (CELEBREX) 100 MG capsule Take 1 capsule (100 mg total) by mouth 2 (two) times daily. 04/07/17  Yes Mikhail, Velta Addison, DO  DULoxetine (CYMBALTA) 60 MG capsule Take 60 mg by mouth at bedtime.  12/22/16  Yes [provider]  HYDROcodone-acetaminophen (NORCO) 7.5-325 MG tablet Take 1-2 tablets by mouth every 6 (six) hours as needed (pain). 05/01/17  Yes Nicholes Stairs, MD  hydrocortisone 2.5 % cream Apply 1 application topically 2 (two) times daily as needed (rash).  11/06/16  Yes [provider]  magnesium hydroxide (MILK OF MAGNESIA) 400 MG/5ML suspension Take 15-30 mLs by mouth daily as needed for mild constipation.   Yes [provider]  pantoprazole (PROTONIX) 40 MG tablet Take 40 mg by mouth daily. 01/19/17  Yes [provider]  polyethylene glycol (MIRALAX / GLYCOLAX) packet Take 34 g by mouth at bedtime. Mix in 6-8 oz liquid and drink   Yes [provider]  rOPINIRole (REQUIP) 0.25 MG tablet Take 1 tablet (0.25 mg total) by mouth daily as needed (severe restless legs). Patient taking differently: Take 0.25 mg by mouth at bedtime as needed (for severely restless legs).  02/10/17  Yes Domenic Polite, MD  senna-docusate (SENOKOT-S) 8.6-50 MG tablet Take 1 tablet by mouth 2 (two) times daily as needed for mild constipation. 05/04/17  Yes Short, Noah Delaine, MD  Tetrahydrozoline HCl (VISINE OP) Place 1 drop into both eyes 2 (two) times daily as needed (for dry eyes).    Yes [provider]  triazolam (HALCION) 0.25 MG tablet Take 0.25 mg by mouth at bedtime. 01/22/17  Yes [provider]  amoxicillin-clavulanate (AUGMENTIN) 875-125 MG tablet Take 1 tablet by mouth 2 (two) times daily. 06/24/17 07/04/17  Tonette Bihari, MD  feeding supplement (BOOST / RESOURCE BREEZE) LIQD Take 1 Container by mouth 3 (three) times daily between meals. Patient not taking: Reported on 06/24/2017 04/07/17   Cristal Ford, DO  glycerin adult 2 g suppository Place 1 suppository rectally as needed for constipation. 06/24/17   Hattie Aguinaldo, Jeani Sow, MD  ondansetron (ZOFRAN) 4 MG tablet Take 1 tablet (4 mg total) by mouth daily as needed for nausea or vomiting. 06/24/17 06/24/18  Tonette Bihari, MD  Respiratory Therapy Supplies (FLUTTER) DEVI Blow into device 3 times in a row twice daily. 04/17/17   Javier Glazier, MD    Family History No family history on file.  Social History Social History  Substance Use Topics  . Smoking status: Never Smoker  . Smokeless tobacco: Never Used  . Alcohol use No     Allergies   Cerefolin [l-methylfolate-b12-b6-b2]; Codeine; Lactose intolerance (gi); and Tape   Review of Systems Review of Systems  Constitutional: Negative for chills and fever.  Respiratory: Negative for cough and shortness of breath.   Gastrointestinal: Positive for abdominal pain, constipation, nausea and vomiting.     Physical Exam Updated Vital Signs BP 109/61   Pulse 95   Temp 98.4 F (36.9 C) (Oral)   Resp (!) 21   SpO2 96%   Physical Exam  Constitutional: She is oriented to person, place, and time. She appears well-developed and well-nourished.  HENT:  Head: Normocephalic and atraumatic.  Eyes: Pupils are equal, round, and reactive to light. Conjunctivae are normal.  Neck: Normal range of motion.  Cardiovascular: Normal rate and regular rhythm.   Pulmonary/Chest: Effort normal and breath sounds normal.  Abdominal:  Slightly distended abdomen, per patient similar to previous. Hyperactive bowel sounds present, no abdominal tenderness noted, no guarding. Rectal  exam negative for stool and rectal vault  Musculoskeletal: Normal range of motion.  Neurological: She is alert and oriented to person, place, and time.  Skin: Skin is warm. Capillary refill takes less than 2 seconds.  Psychiatric: She has a normal mood and affect.     ED Treatments / Results  Labs (all labs ordered are listed, but only abnormal results are displayed) Labs Reviewed  COMPREHENSIVE METABOLIC PANEL - Abnormal; Notable for the following:       Result Value   CO2 35 (*)    Total Protein 6.3 (*)    ALT 8 (*)    GFR calc non Af Amer 57 (*)    Anion gap 3 (*)    All other components within normal limits  CBC - Abnormal; Notable for the following:    RBC 3.15 (*)    Hemoglobin 10.3 (*)    HCT 32.2 (*)    MCV 102.2 (*)    All other components within normal limits  URINALYSIS, ROUTINE W REFLEX MICROSCOPIC - Abnormal; Notable for the following:    Color, Urine AMBER (*)    APPearance TURBID (*)  Protein, ur 30 (*)    Bacteria, UA RARE (*)    Squamous Epithelial / LPF 0-5 (*)    All other components within normal limits  LIPASE, BLOOD    EKG  EKG Interpretation None       Radiology Ct Abdomen Pelvis W Contrast  Result Date: 06/24/2017 CLINICAL DATA:  Mid upper abd pain with liquid stools with hx of IBS, pts GI MD sent here for eval for blockage, last normal BM x 3 days ago EXAM: CT ABDOMEN AND PELVIS WITH CONTRAST TECHNIQUE: Multidetector CT imaging of the abdomen and pelvis was performed using the standard protocol following bolus administration of intravenous contrast. CONTRAST:  130mL ISOVUE-300 IOPAMIDOL (ISOVUE-300) INJECTION 61% COMPARISON:  CT abdomen dated 03/18/2017. FINDINGS: Lower chest: Bibasilar pleural effusions, right greater than left, at least moderate in size on the right, both incompletely imaged. Additional small bibasilar consolidations, incompletely imaged, most likely atelectasis. Hepatobiliary: Stable small hypodense lesion within the left  liver lobe, too small to definitively characterize, but most likely benign cyst. No suspicious mass or lesion within the liver. Gallbladder is mildly distended but otherwise unremarkable. No bile duct dilatation. Pancreas: Unremarkable. No pancreatic ductal dilatation or surrounding inflammatory changes. Spleen: Normal in size without focal abnormality. Adrenals/Urinary Tract: Adrenal glands are poorly seen. Kidneys are unremarkable without mass, stone or hydronephrosis. Bladder appears normal. Stomach/Bowel: Fluid is seen throughout the grossly nondistended large and small bowel, with associated air-fluid levels throughout. No dilated large or small bowel loops identified. Questionable thickening/enhancement of the walls of the small bowel in the central abdomen and upper pelvis, difficult to characterize without oral contrast. Additional questionable thickening of the walls of the rectum. Vascular/Lymphatic: No significant vascular findings are present. No enlarged abdominal or pelvic lymph nodes. Reproductive: Unremarkable. No adnexal mass or free fluid identified. Other: No free fluid or abscess collection identified. No free intraperitoneal air seen. Musculoskeletal: No acute or suspicious osseous finding. Fixation hardware appears appropriately positioned within the lumbar spine and lower thoracic spine, status post surgical changes of central canal decompression in the lumbar spine. Anasarca. IMPRESSION: 1. Fluid throughout the grossly nondistended large and small bowel, with associated air-fluid levels throughout, suggesting a diffuse gastroenteritis or enterocolitis. No evidence of bowel obstruction. Questionable thickening of the walls of the small bowel in the central abdomen and upper pelvis, again suggesting infectious or inflammatory enteritis. 2. Additional questionable thickening of the walls of the rectum (associated proctocolitis? ). Neoplastic rectal wall thickening cannot be excluded on these  images. 3. Bibasilar pleural effusions, right greater than left, at least moderate in size on the right, incompletely imaged. Small adjacent consolidations are most likely atelectasis. Electronically Signed   By: Franki Cabot M.D.   On: 06/24/2017 20:17    Procedures Procedures (including critical care time)  Medications Ordered in ED Medications  amoxicillin-clavulanate (AUGMENTIN) 875-125 MG per tablet 1 tablet (not administered)  iopamidol (ISOVUE-300) 61 % injection (100 mLs  Contrast Given 06/24/17 1933)     Initial Impression / Assessment and Plan / ED Course  I have reviewed the triage vital signs and the nursing notes.  Pertinent labs & imaging results that were available during my care of the patient were reviewed by me and considered in my medical decision making (see chart for details).   Patient presents with an liquid stools for 3 days and 1 emesis today. Patient's CT was significant for colitis and proctocolitis.given findings of CT abdomen and less concern for small bowel obstruction. Patient concerned  that she has not had a solid bowel movement in about 3 days.patient's last bowel movement was earlier today. Will treat patient for colitis with Augmentin outpatient for 10 days. Discussed follow-up in the next 3 days with gastroenterologist.  Final Clinical Impressions(s) / ED Diagnoses   Final diagnoses:  Colitis    New Prescriptions New Prescriptions   AMOXICILLIN-CLAVULANATE (AUGMENTIN) 875-125 MG TABLET    Take 1 tablet by mouth 2 (two) times daily.   GLYCERIN ADULT 2 G SUPPOSITORY    Place 1 suppository rectally as needed for constipation.   ONDANSETRON (ZOFRAN) 4 MG TABLET    Take 1 tablet (4 mg total) by mouth daily as needed for nausea or vomiting.     Tonette Bihari, MD 06/24/17 2126    Tonette Bihari, MD 06/24/17 2159    Macarthur Critchley, MD 06/28/17 [phone]

## 2017-06-24 NOTE — ED Notes (Signed)
Patient in CT

## 2017-06-28 ENCOUNTER — Encounter (HOSPITAL_COMMUNITY): Payer: Self-pay | Admitting: Emergency Medicine

## 2017-06-28 ENCOUNTER — Emergency Department (HOSPITAL_COMMUNITY): Payer: Medicare Other

## 2017-06-28 ENCOUNTER — Inpatient Hospital Stay (HOSPITAL_COMMUNITY)
Admission: EM | Admit: 2017-06-28 | Discharge: 2017-07-02 | DRG: 871 | Disposition: A | Discharge status: Hospice | Payer: Medicare Other | Attending: Internal Medicine | Admitting: Internal Medicine

## 2017-06-28 DIAGNOSIS — D638 Anemia in other chronic diseases classified elsewhere: Secondary | ICD-10-CM | POA: Diagnosis present

## 2017-06-28 DIAGNOSIS — K529 Noninfective gastroenteritis and colitis, unspecified: Secondary | ICD-10-CM | POA: Diagnosis not present

## 2017-06-28 DIAGNOSIS — G9341 Metabolic encephalopathy: Secondary | ICD-10-CM | POA: Diagnosis present

## 2017-06-28 DIAGNOSIS — Z515 Encounter for palliative care: Secondary | ICD-10-CM | POA: Diagnosis present

## 2017-06-28 DIAGNOSIS — Z79899 Other long term (current) drug therapy: Secondary | ICD-10-CM

## 2017-06-28 DIAGNOSIS — Z66 Do not resuscitate: Secondary | ICD-10-CM | POA: Diagnosis present

## 2017-06-28 DIAGNOSIS — R000 Tachycardia, unspecified: Secondary | ICD-10-CM | POA: Diagnosis present

## 2017-06-28 DIAGNOSIS — E739 Lactose intolerance, unspecified: Secondary | ICD-10-CM | POA: Diagnosis present

## 2017-06-28 DIAGNOSIS — K567 Ileus, unspecified: Secondary | ICD-10-CM | POA: Diagnosis present

## 2017-06-28 DIAGNOSIS — Z888 Allergy status to other drugs, medicaments and biological substances status: Secondary | ICD-10-CM

## 2017-06-28 DIAGNOSIS — K589 Irritable bowel syndrome without diarrhea: Secondary | ICD-10-CM | POA: Diagnosis present

## 2017-06-28 DIAGNOSIS — I10 Essential (primary) hypertension: Secondary | ICD-10-CM | POA: Diagnosis present

## 2017-06-28 DIAGNOSIS — Z91048 Other nonmedicinal substance allergy status: Secondary | ICD-10-CM

## 2017-06-28 DIAGNOSIS — R54 Age-related physical debility: Secondary | ICD-10-CM | POA: Diagnosis present

## 2017-06-28 DIAGNOSIS — Z885 Allergy status to narcotic agent status: Secondary | ICD-10-CM

## 2017-06-28 DIAGNOSIS — A419 Sepsis, unspecified organism: Principal | ICD-10-CM | POA: Diagnosis present

## 2017-06-28 DIAGNOSIS — J479 Bronchiectasis, uncomplicated: Secondary | ICD-10-CM | POA: Diagnosis present

## 2017-06-28 HISTORY — DX: Low back pain, unspecified: M54.50

## 2017-06-28 HISTORY — DX: Personal history of other diseases of the digestive system: Z87.19

## 2017-06-28 HISTORY — DX: Personal history of peptic ulcer disease: Z87.11

## 2017-06-28 HISTORY — DX: Low back pain: M54.5

## 2017-06-28 HISTORY — DX: Other chronic pain: G89.29

## 2017-06-28 LAB — CBC WITH DIFFERENTIAL/PLATELET
Basophils Absolute: 0 10*3/uL (ref 0.0–0.1)
Basophils Relative: 0 %
Eosinophils Absolute: 0 10*3/uL (ref 0.0–0.7)
Eosinophils Relative: 0 %
HEMATOCRIT: 29.4 % — AB (ref 36.0–46.0)
Hemoglobin: 9.8 g/dL — ABNORMAL LOW (ref 12.0–15.0)
LYMPHS ABS: 0.5 10*3/uL — AB (ref 0.7–4.0)
LYMPHS PCT: 8 %
MCH: 33.6 pg (ref 26.0–34.0)
MCHC: 33.3 g/dL (ref 30.0–36.0)
MCV: 100.7 fL — AB (ref 78.0–100.0)
MONO ABS: 0.3 10*3/uL (ref 0.1–1.0)
MONOS PCT: 5 %
NEUTROS ABS: 6 10*3/uL (ref 1.7–7.7)
Neutrophils Relative %: 87 %
PLATELETS: 319 10*3/uL (ref 150–400)
RBC: 2.92 MIL/uL — ABNORMAL LOW (ref 3.87–5.11)
RDW: 14.2 % (ref 11.5–15.5)
WBC: 6.9 10*3/uL (ref 4.0–10.5)

## 2017-06-28 LAB — COMPREHENSIVE METABOLIC PANEL
ALK PHOS: 88 U/L (ref 38–126)
ALT: 8 U/L — AB (ref 14–54)
ANION GAP: 11 (ref 5–15)
AST: 17 U/L (ref 15–41)
Albumin: 3.4 g/dL — ABNORMAL LOW (ref 3.5–5.0)
BUN: 16 mg/dL (ref 6–20)
CO2: 24 mmol/L (ref 22–32)
CREATININE: 0.81 mg/dL (ref 0.44–1.00)
Calcium: 9.4 mg/dL (ref 8.9–10.3)
Chloride: 98 mmol/L — ABNORMAL LOW (ref 101–111)
GFR calc Af Amer: >60 mL/min (ref >60)
Glucose, Bld: 105 mg/dL — ABNORMAL HIGH (ref 65–99)
Potassium: 4 mmol/L (ref 3.5–5.1)
SODIUM: 133 mmol/L — AB (ref 135–145)
Total Bilirubin: 0.8 mg/dL (ref 0.3–1.2)
Total Protein: 6.3 g/dL — ABNORMAL LOW (ref 6.5–8.1)

## 2017-06-28 LAB — LIPASE, BLOOD: LIPASE: 23 U/L (ref 11–51)

## 2017-06-28 LAB — I-STAT CG4 LACTIC ACID, ED: LACTIC ACID, VENOUS: 1.51 mmol/L (ref 0.5–1.9)

## 2017-06-28 MED ORDER — ONDANSETRON HCL 4 MG/2ML IJ SOLN
4.0000 mg | Freq: Once | INTRAMUSCULAR | Status: AC
  Administered 2017-06-28: 4 mg via INTRAVENOUS
  Filled 2017-06-28: qty 2

## 2017-06-28 MED ORDER — IOPAMIDOL (ISOVUE-300) INJECTION 61%
INTRAVENOUS | Status: AC
  Filled 2017-06-28: qty 30

## 2017-06-28 MED ORDER — SODIUM CHLORIDE 0.9 % IV BOLUS (SEPSIS)
500.0000 mL | Freq: Once | INTRAVENOUS | Status: AC
  Administered 2017-06-28: 500 mL via INTRAVENOUS

## 2017-06-28 MED ORDER — IOPAMIDOL (ISOVUE-300) INJECTION 61%
INTRAVENOUS | Status: AC
  Administered 2017-06-28: 100 mL via INTRAVENOUS
  Filled 2017-06-28: qty 100

## 2017-06-28 MED ORDER — ACETAMINOPHEN 500 MG PO TABS
1000.0000 mg | ORAL_TABLET | Freq: Once | ORAL | Status: AC
  Administered 2017-06-28: 1000 mg via ORAL
  Filled 2017-06-28: qty 2

## 2017-06-28 MED ORDER — MORPHINE SULFATE (PF) 4 MG/ML IV SOLN
2.0000 mg | Freq: Once | INTRAVENOUS | Status: AC
  Administered 2017-06-28: 2 mg via INTRAVENOUS
  Filled 2017-06-28: qty 1

## 2017-06-28 NOTE — ED Triage Notes (Signed)
Pt complains of generalized abd pains that have been going on for 5 days now. Pt reports a colonoscopy in the morning but states she cannot wait until them for relief. Pt reports pain is a 7 out of 10.

## 2017-06-28 NOTE — ED Notes (Signed)
Taken to ct at this time. 

## 2017-06-28 NOTE — ED Provider Notes (Addendum)
[facility] DEPT Provider Note   CSN: [id number] Arrival date & time: 06/28/17  2014     History   Chief Complaint Chief Complaint  Patient presents with  . Abdominal Pain    HPI PAYTIENCE [name] is a 83 y.o. female.  The history is provided by the patient.  Abdominal Pain   This is a new problem. The current episode started more than 2 days ago. The problem occurs constantly. The problem has been gradually worsening. The pain is associated with an unknown factor. The pain is located in the generalized abdominal region. The pain is severe. Pertinent negatives include fever and dysuria. The symptoms are aggravated by palpation. Nothing relieves the symptoms.   83 year old female who presents with abdominal pain. History of IBS, no prior abdominal surgeries. Onset of abdominal pain with diarrhea one week ago. Seen in ED 4 days ago and diagnosed with colitis on CT scan. Was discharge with course of augmentin which she is taking. Stopped taking stool softeners, and states she has not had BM in 4-5 days. Has had increased abdominal distension and worsening upper abdominal pain. No nausea, vomiting, fever or chills, chest pain or difficulty breathing, dysuria, or urinary frequency.   Of note, CT scan also notable for rectum wall thickening c/f neoplasm versus proctocolitis. She has scheduled sigmoidoscopy tomorrow.  Past Medical History:  Diagnosis Date  . Arthritis   . Bronchiectasis ([place])   . Hypertension   . IBS (irritable bowel syndrome)   . Pneumonia     Patient Active Problem List   Diagnosis Date Noted  . Hip fracture ([place]) 05/01/2017  . Depression 05/01/2017  . Bronchiectasis without complication ([place]) 31/51/7616  . Protein-calorie malnutrition, severe 04/07/2017  . Pleural effusion   . Consolidation lung ([place]) left lower lobe 04/05/2017  . Loss of weight 04/05/2017  . Cough   . Left lower lobe pneumonia ([place]) 04/04/2017  . GERD (gastroesophageal reflux disease)  04/04/2017  . IBS (irritable bowel syndrome)   . Dysphagia 02/06/2017  . Osteoarthritis 02/06/2017    Past Surgical History:  Procedure Laterality Date  . INTRAMEDULLARY (IM) NAIL INTERTROCHANTERIC Right 05/01/2017   Procedure: INTRAMEDULLARY (IM) NAIL INTERTROCHANTRIC;  Surgeon: Nicholes Stairs, MD;  Location: [facility];  Service: Orthopedics;  Laterality: Right;    OB History    No data available       Home Medications    Prior to Admission medications   Medication Sig Start Date End Date Taking? Authorizing Provider  amoxicillin-clavulanate (AUGMENTIN) 875-125 MG tablet Take 1 tablet by mouth 2 (two) times daily. 06/24/17 07/04/17 Yes Mikell, Jeani Sow, MD  celecoxib (CELEBREX) 100 MG capsule Take 1 capsule (100 mg total) by mouth 2 (two) times daily. 04/07/17  Yes Mikhail, Velta Addison, DO  DULoxetine (CYMBALTA) 60 MG capsule Take 60 mg by mouth at bedtime.  12/22/16  Yes [provider]  glycerin adult 2 g suppository Place 1 suppository rectally as needed for constipation. 06/24/17  Yes Mikell, Jeani Sow, MD  HYDROcodone-acetaminophen (NORCO) 7.5-325 MG tablet Take 1-2 tablets by mouth every 6 (six) hours as needed (pain). 05/01/17  Yes Nicholes Stairs, MD  hydrocortisone 2.5 % cream Apply 1 application topically 2 (two) times daily as needed (rash).  11/06/16  Yes [provider]  magnesium hydroxide (MILK OF MAGNESIA) 400 MG/5ML suspension Take 15-30 mLs by mouth daily as needed for mild constipation.   Yes [provider]  ondansetron (ZOFRAN ODT) 4 MG disintegrating tablet Take 1  tablet (4 mg total) by mouth every 8 (eight) hours as needed for nausea or vomiting. 06/24/17  Yes Kinnie Feil, PA-C  pantoprazole (PROTONIX) 40 MG tablet Take 40 mg by mouth daily. 01/19/17  Yes [provider]  polyethylene glycol (MIRALAX / GLYCOLAX) packet Take 34 g by mouth at bedtime. Mix in 6-8 oz liquid and drink   Yes [provider]    rOPINIRole (REQUIP) 0.25 MG tablet Take 1 tablet (0.25 mg total) by mouth daily as needed (severe restless legs). Patient taking differently: Take 0.25 mg by mouth at bedtime as needed (for severely restless legs).  02/10/17  Yes Domenic Polite, MD  senna-docusate (SENOKOT-S) 8.6-50 MG tablet Take 1 tablet by mouth 2 (two) times daily as needed for mild constipation. 05/04/17  Yes Short, Noah Delaine, MD  Tetrahydrozoline HCl (VISINE OP) Place 1 drop into both eyes 2 (two) times daily as needed (for dry eyes).    Yes [provider]  triazolam (HALCION) 0.25 MG tablet Take 0.25 mg by mouth at bedtime. 01/22/17  Yes [provider]  feeding supplement (BOOST / RESOURCE BREEZE) LIQD Take 1 Container by mouth 3 (three) times daily between meals. Patient not taking: Reported on 06/24/2017 04/07/17   Cristal Ford, DO  ondansetron (ZOFRAN) 4 MG tablet Take 1 tablet (4 mg total) by mouth daily as needed for nausea or vomiting. Patient not taking: Reported on 06/28/2017 06/24/17 06/24/18  Tonette Bihari, MD  Respiratory Therapy Supplies (FLUTTER) DEVI Blow into device 3 times in a row twice daily. 04/17/17   Javier Glazier, MD    Family History No family history on file.  Social History Social History  Substance Use Topics  . Smoking status: Never Smoker  . Smokeless tobacco: Never Used  . Alcohol use No     Allergies   Cerefolin [l-methylfolate-b12-b6-b2]; Codeine; Lactose intolerance (gi); and Tape   Review of Systems Review of Systems  Constitutional: Negative for fever.  Respiratory: Negative for shortness of breath.   Cardiovascular: Negative for chest pain.  Gastrointestinal: Positive for abdominal pain.  Genitourinary: Negative for difficulty urinating and dysuria.  All other systems reviewed and are negative.    Physical Exam Updated Vital Signs BP 103/68   Pulse 67   Temp (!) 100.7 F (38.2 C) (Oral)   Resp 18   Ht 5\' 1"  (1.549 m)   Wt 49.4 kg (109  lb)   SpO2 99%   BMI 20.60 kg/m   Physical Exam Physical Exam  Nursing note and vitals reviewed. Constitutional: Frail elderly woman, thin, non-toxic, and in no acute distress Head: Normocephalic and atraumatic.  Mouth/Throat: Oropharynx is clear and moist.  Neck: Normal range of motion. Neck supple.  Cardiovascular: Normal rate and regular rhythm.   Pulmonary/Chest: Effort normal and breath sounds normal.  Abdominal: Soft. Moderate distension. There is upper abdominal tenderness. There is no rebound and no guarding.  Musculoskeletal: Normal range of motion.  Neurological: Alert, no facial droop, fluent speech, moves all extremities symmetrically Skin: Skin is warm and dry.  Psychiatric: Cooperative   ED Treatments / Results  Labs (all labs ordered are listed, but only abnormal results are displayed) Labs Reviewed  CBC WITH DIFFERENTIAL/PLATELET - Abnormal; Notable for the following:       Result Value   RBC 2.92 (*)    Hemoglobin 9.8 (*)    HCT 29.4 (*)    MCV 100.7 (*)    Lymphs Abs 0.5 (*)    All other  components within normal limits  COMPREHENSIVE METABOLIC PANEL - Abnormal; Notable for the following:    Sodium 133 (*)    Chloride 98 (*)    Glucose, Bld 105 (*)    Total Protein 6.3 (*)    Albumin 3.4 (*)    ALT 8 (*)    All other components within normal limits  LIPASE, BLOOD  URINALYSIS, ROUTINE W REFLEX MICROSCOPIC  I-STAT CG4 LACTIC ACID, ED    EKG  EKG Interpretation None       Radiology Ct Abdomen Pelvis W Contrast  Result Date: 06/29/2017 CLINICAL DATA:  Abdominal pain and fever. Recent diagnosis of colitis. On antibiotics. No bowel movement for 5 days. Assess for abscess. EXAM: CT ABDOMEN AND PELVIS WITH CONTRAST TECHNIQUE: Multidetector CT imaging of the abdomen and pelvis was performed using the standard protocol following bolus administration of intravenous contrast. CONTRAST:  17mL ISOVUE-300 IOPAMIDOL (ISOVUE-300) INJECTION 61% COMPARISON:  CT  abdomen and pelvis June 24, 2017 an CT abdomen and pelvis March 18, 2017 FINDINGS: LOWER CHEST: Moderate RIGHT and small LEFT pleural effusions. Bibasilar atelectasis. Heart size is normal. No pericardial effusion. HEPATOBILIARY: 1 cm cyst LEFT lobe of the liver. Trace periportal edema. Distended gallbladder without acute cholecystitis by CT. PANCREAS: Normal. SPLEEN: Normal. ADRENALS/URINARY TRACT: Kidneys are orthotopic, demonstrating symmetric enhancement. No nephrolithiasis, hydronephrosis or solid renal masses. Too small to characterize hypodensities in the kidneys bilaterally. The unopacified ureters are normal in course and caliber. Delayed imaging through the kidneys demonstrates symmetric prompt contrast excretion within the proximal urinary collecting system. Urinary bladder is partially distended and unremarkable. Normal adrenal glands. STOMACH/BOWEL: Rectal wall thickening. Moderate amount of retained large bowel stool. Small and large bowel wall thickening. Enteric contrast has not yet reached the distal small bowel. Small bowel air-fluid levels. Proximal large bowel air feces levels. VASCULAR/LYMPHATIC: Aortoiliac vessels are normal in course and caliber. Mild calcific atherosclerosis. No lymphadenopathy by CT size criteria. REPRODUCTIVE: Normal. OTHER: Small amount of ascites. No focal fluid collection or free air. MUSCULOSKELETAL: Mild anasarca. RIGHT femur ORIF. Osteopenia. T10 through S1 posterior instrumentation, RIGHT T10 pedicle screw violates the superior cortex. Lower lumbar spine arthrodesis. IMPRESSION: 1. Worsening enterocolitis. Rectal wall thickening/proctitis ; given chronicity, neoplasm is possible. 2. Ileus.  Moderate amount of retained large bowel stool. 3. Small volume ascites.  No drainable fluid collection. 4. Similar moderate RIGHT and small LEFT pleural effusions. Aortic Atherosclerosis (ICD10-I70.0). Electronically Signed   By: Elon Alas M.D.   On: 06/29/2017 00:26      Procedures Procedures (including critical care time)  Medications Ordered in ED Medications  iopamidol (ISOVUE-300) 61 % injection (not administered)  piperacillin-tazobactam (ZOSYN) IVPB 3.375 g (not administered)  0.9 %  sodium chloride infusion (not administered)  morphine 4 MG/ML injection 2 mg (not administered)  morphine 4 MG/ML injection 2 mg (2 mg Intravenous Given 06/28/17 2132)  ondansetron (ZOFRAN) injection 4 mg (4 mg Intravenous Given 06/28/17 2132)  sodium chloride 0.9 % bolus 500 mL (0 mLs Intravenous Stopped 06/28/17 2325)  iopamidol (ISOVUE-300) 61 % injection (100 mLs Intravenous Contrast Given 06/28/17 2344)  acetaminophen (TYLENOL) tablet 1,000 mg (1,000 mg Oral Given 06/28/17 2143)  morphine 4 MG/ML injection 2 mg (2 mg Intravenous Given 06/28/17 2334)     Initial Impression / Assessment and Plan / ED Course  I have reviewed the triage vital signs and the nursing notes.  Pertinent labs & imaging results that were available during my care of the patient were reviewed by me  and considered in my medical decision making (see chart for details).     83 year old female recently diagnosed with colitis who now comes in with fever, tachycardia, worsening pain while on outpatient antibiotics. Concern for complication such as abscess, perforation, worsening infection or other serious process. Will repeat CT abd/pelvis, basic blood work, and UA.   She has normal lactic acid, no significant leukocytosis. Blood work reassuring. CT scan visualized and show evidence of worsening enterocolitis. No evidence of perforation or abscess. Given her fever and tachycardia, will initiate on Zosyn. Plan for admission to hospitalist service for ongoing monitoring and treatment given failure of outpatient therapy.  Final Clinical Impressions(s) / ED Diagnoses   Final diagnoses:  Enterocolitis    New Prescriptions New Prescriptions   No medications on file     Forde Dandy,  MD 06/29/17 [id number]    Forde Dandy, MD 06/29/17 [phone]

## 2017-06-29 ENCOUNTER — Inpatient Hospital Stay (HOSPITAL_COMMUNITY): Payer: Medicare Other

## 2017-06-29 ENCOUNTER — Encounter (HOSPITAL_COMMUNITY): Payer: Self-pay | Admitting: General Practice

## 2017-06-29 DIAGNOSIS — K589 Irritable bowel syndrome without diarrhea: Secondary | ICD-10-CM | POA: Diagnosis present

## 2017-06-29 DIAGNOSIS — Z885 Allergy status to narcotic agent status: Secondary | ICD-10-CM | POA: Diagnosis not present

## 2017-06-29 DIAGNOSIS — K567 Ileus, unspecified: Secondary | ICD-10-CM | POA: Diagnosis present

## 2017-06-29 DIAGNOSIS — R54 Age-related physical debility: Secondary | ICD-10-CM | POA: Diagnosis present

## 2017-06-29 DIAGNOSIS — D638 Anemia in other chronic diseases classified elsewhere: Secondary | ICD-10-CM | POA: Diagnosis not present

## 2017-06-29 DIAGNOSIS — Z888 Allergy status to other drugs, medicaments and biological substances status: Secondary | ICD-10-CM | POA: Diagnosis not present

## 2017-06-29 DIAGNOSIS — Z515 Encounter for palliative care: Secondary | ICD-10-CM | POA: Diagnosis present

## 2017-06-29 DIAGNOSIS — Z66 Do not resuscitate: Secondary | ICD-10-CM | POA: Diagnosis present

## 2017-06-29 DIAGNOSIS — G9341 Metabolic encephalopathy: Secondary | ICD-10-CM | POA: Diagnosis present

## 2017-06-29 DIAGNOSIS — R000 Tachycardia, unspecified: Secondary | ICD-10-CM | POA: Diagnosis present

## 2017-06-29 DIAGNOSIS — A419 Sepsis, unspecified organism: Principal | ICD-10-CM

## 2017-06-29 DIAGNOSIS — Z91048 Other nonmedicinal substance allergy status: Secondary | ICD-10-CM | POA: Diagnosis not present

## 2017-06-29 DIAGNOSIS — E739 Lactose intolerance, unspecified: Secondary | ICD-10-CM | POA: Diagnosis present

## 2017-06-29 DIAGNOSIS — K529 Noninfective gastroenteritis and colitis, unspecified: Secondary | ICD-10-CM | POA: Diagnosis present

## 2017-06-29 DIAGNOSIS — Z79899 Other long term (current) drug therapy: Secondary | ICD-10-CM | POA: Diagnosis not present

## 2017-06-29 DIAGNOSIS — J479 Bronchiectasis, uncomplicated: Secondary | ICD-10-CM | POA: Diagnosis present

## 2017-06-29 DIAGNOSIS — I10 Essential (primary) hypertension: Secondary | ICD-10-CM | POA: Diagnosis present

## 2017-06-29 LAB — BASIC METABOLIC PANEL
ANION GAP: 8 (ref 5–15)
BUN: 17 mg/dL (ref 6–20)
CHLORIDE: 102 mmol/L (ref 101–111)
CO2: 23 mmol/L (ref 22–32)
Calcium: 8.5 mg/dL — ABNORMAL LOW (ref 8.9–10.3)
Creatinine, Ser: 1 mg/dL (ref 0.44–1.00)
GFR calc Af Amer: 58 mL/min — ABNORMAL LOW (ref >60)
GFR calc non Af Amer: 50 mL/min — ABNORMAL LOW (ref >60)
GLUCOSE: 99 mg/dL (ref 65–99)
POTASSIUM: 4.1 mmol/L (ref 3.5–5.1)
Sodium: 133 mmol/L — ABNORMAL LOW (ref 135–145)

## 2017-06-29 LAB — CBC
HEMATOCRIT: 27.2 % — AB (ref 36.0–46.0)
Hemoglobin: 9.2 g/dL — ABNORMAL LOW (ref 12.0–15.0)
MCH: 34.2 pg — ABNORMAL HIGH (ref 26.0–34.0)
MCHC: 33.8 g/dL (ref 30.0–36.0)
MCV: 101.1 fL — AB (ref 78.0–100.0)
Platelets: 249 10*3/uL (ref 150–400)
RBC: 2.69 MIL/uL — AB (ref 3.87–5.11)
RDW: 14.6 % (ref 11.5–15.5)
WBC: 4.9 10*3/uL (ref 4.0–10.5)

## 2017-06-29 LAB — MAGNESIUM: Magnesium: 1.7 mg/dL (ref 1.7–2.4)

## 2017-06-29 LAB — OCCULT BLOOD, POC DEVICE: FECAL OCCULT BLD: NEGATIVE

## 2017-06-29 MED ORDER — DEXTROSE IN LACTATED RINGERS 5 % IV SOLN
INTRAVENOUS | Status: DC
  Administered 2017-06-29 – 2017-06-30 (×4): via INTRAVENOUS

## 2017-06-29 MED ORDER — MORPHINE SULFATE (PF) 4 MG/ML IV SOLN: 2.0000 mg | INTRAVENOUS | Status: DC | PRN

## 2017-06-29 MED ORDER — LIDOCAINE VISCOUS 2 % MT SOLN
15.0000 mL | Freq: Once | OROMUCOSAL | Status: AC
  Administered 2017-06-29: 3 mL via OROMUCOSAL

## 2017-06-29 MED ORDER — ONDANSETRON HCL 4 MG PO TABS: 4.0000 mg | ORAL_TABLET | Freq: Four times a day (QID) | ORAL | Status: DC | PRN

## 2017-06-29 MED ORDER — ENOXAPARIN SODIUM 40 MG/0.4ML ~~LOC~~ SOLN
40.0000 mg | SUBCUTANEOUS | Status: DC
  Administered 2017-06-29 – 2017-06-30 (×2): 40 mg via SUBCUTANEOUS
  Filled 2017-06-29 (×3): qty 0.4

## 2017-06-29 MED ORDER — SODIUM CHLORIDE 0.9 % IV SOLN
INTRAVENOUS | Status: DC
  Administered 2017-06-29: 01:00:00 via INTRAVENOUS

## 2017-06-29 MED ORDER — PIPERACILLIN-TAZOBACTAM 3.375 G IVPB
3.3750 g | Freq: Three times a day (TID) | INTRAVENOUS | Status: DC
  Administered 2017-06-29 – 2017-07-02 (×10): 3.375 g via INTRAVENOUS
  Filled 2017-06-29 (×11): qty 50

## 2017-06-29 MED ORDER — PANTOPRAZOLE SODIUM 40 MG PO TBEC: 40.0000 mg | DELAYED_RELEASE_TABLET | Freq: Every day | ORAL | Status: DC

## 2017-06-29 MED ORDER — ONDANSETRON 4 MG PO TBDP: 4.0000 mg | ORAL_TABLET | Freq: Three times a day (TID) | ORAL | Status: DC | PRN

## 2017-06-29 MED ORDER — SODIUM CHLORIDE 0.9 % IV BOLUS (SEPSIS)
1000.0000 mL | Freq: Once | INTRAVENOUS | Status: AC
  Administered 2017-06-29: 1000 mL via INTRAVENOUS

## 2017-06-29 MED ORDER — PROMETHAZINE HCL 25 MG/ML IJ SOLN: 12.5000 mg | Freq: Four times a day (QID) | INTRAMUSCULAR | Status: DC | PRN

## 2017-06-29 MED ORDER — TRIAZOLAM 0.125 MG PO TABS
0.2500 mg | ORAL_TABLET | Freq: Every evening | ORAL | Status: DC | PRN
  Administered 2017-06-30: 0.25 mg via ORAL
  Filled 2017-06-29: qty 2

## 2017-06-29 MED ORDER — ACETAMINOPHEN 325 MG PO TABS: 650.0000 mg | ORAL_TABLET | Freq: Four times a day (QID) | ORAL | Status: DC | PRN

## 2017-06-29 MED ORDER — ONDANSETRON HCL 4 MG/2ML IJ SOLN: 4.0000 mg | Freq: Four times a day (QID) | INTRAMUSCULAR | Status: DC | PRN

## 2017-06-29 MED ORDER — MAGNESIUM SULFATE 2 GM/50ML IV SOLN
2.0000 g | Freq: Once | INTRAVENOUS | Status: AC
  Administered 2017-06-29: 2 g via INTRAVENOUS
  Filled 2017-06-29: qty 50

## 2017-06-29 MED ORDER — PANTOPRAZOLE SODIUM 40 MG IV SOLR
40.0000 mg | Freq: Every day | INTRAVENOUS | Status: DC
  Administered 2017-06-30 – 2017-07-02 (×3): 40 mg via INTRAVENOUS
  Filled 2017-06-29 (×3): qty 40

## 2017-06-29 MED ORDER — PIPERACILLIN-TAZOBACTAM 3.375 G IVPB 30 MIN
3.3750 g | Freq: Once | INTRAVENOUS | Status: AC
  Administered 2017-06-29: 3.375 g via INTRAVENOUS
  Filled 2017-06-29: qty 50

## 2017-06-29 MED ORDER — ACETAMINOPHEN 650 MG RE SUPP: 650.0000 mg | Freq: Four times a day (QID) | RECTAL | Status: DC | PRN

## 2017-06-29 MED ORDER — ROPINIROLE HCL 0.25 MG PO TABS: 0.2500 mg | ORAL_TABLET | Freq: Every evening | ORAL | Status: DC | PRN

## 2017-06-29 MED ORDER — MORPHINE SULFATE (PF) 4 MG/ML IV SOLN
2.0000 mg | Freq: Once | INTRAVENOUS | Status: AC
  Administered 2017-06-29: 2 mg via INTRAVENOUS
  Filled 2017-06-29: qty 1

## 2017-06-29 MED ORDER — TRIAZOLAM 0.125 MG PO TABS
0.2500 mg | ORAL_TABLET | Freq: Every day | ORAL | Status: DC
  Administered 2017-06-29: 0.25 mg via ORAL
  Filled 2017-06-29: qty 2

## 2017-06-29 MED ORDER — SODIUM CHLORIDE 0.9 % IV SOLN: INTRAVENOUS | Status: DC

## 2017-06-29 MED ORDER — SODIUM CHLORIDE 0.9 % IV SOLN: Freq: Once | INTRAVENOUS | Status: DC

## 2017-06-29 MED ORDER — MORPHINE SULFATE (PF) 4 MG/ML IV SOLN
2.0000 mg | INTRAVENOUS | Status: DC | PRN
  Administered 2017-06-29 – 2017-06-30 (×7): 2 mg via INTRAVENOUS
  Filled 2017-06-29 (×7): qty 1

## 2017-06-29 MED ORDER — DULOXETINE HCL 60 MG PO CPEP
60.0000 mg | ORAL_CAPSULE | Freq: Every day | ORAL | Status: DC
  Administered 2017-06-29: 60 mg via ORAL
  Filled 2017-06-29: qty 1

## 2017-06-29 MED ORDER — LIDOCAINE VISCOUS 2 % MT SOLN
OROMUCOSAL | Status: AC
  Administered 2017-06-29: 3 mL via OROMUCOSAL
  Filled 2017-06-29: qty 15

## 2017-06-29 MED ORDER — GLYCERIN (LAXATIVE) 2.1 G RE SUPP
1.0000 | RECTAL | Status: DC | PRN
  Filled 2017-06-29: qty 1

## 2017-06-29 MED ORDER — CELECOXIB 100 MG PO CAPS
100.0000 mg | ORAL_CAPSULE | Freq: Two times a day (BID) | ORAL | Status: DC
  Filled 2017-06-29: qty 1

## 2017-06-29 NOTE — H&P (Signed)
History and Physical    [name] [facility] QMG:[id number] DOB: 12/05/1929 DOA: 06/28/2017  PCP: Orpah Melter, MD  Patient coming from: Home  I have personally briefly reviewed patient's old medical records in [place]  Chief Complaint: abdominal pain  HPI: [name] is a 83 y.o. female with medical history significant of IBS, HTN.  Patient presents to the ED with c/o progressively worsening abdominal pain.  Onset of abd pain with diarrhea one week ago.  Seen in ED 4 days ago, diagnosed with colitis on CT scan.  Started on augmentin course and discharged, stopped stool softeners.  Since that time no further BMs, but abd pain and now distention have worsened, no BM in 4-5 days.  Had colonoscopy scheduled with [facility] in AM, but couldn't wait so now back in ED.   ED Course: Tm 100.7.  CT shows colitis with ileus.  Patient started on zosyn.   Review of Systems: As per HPI otherwise 10 point review of systems negative.   Past Medical History:  Diagnosis Date  . Arthritis   . Bronchiectasis ([place])   . Hypertension   . IBS (irritable bowel syndrome)   . Pneumonia     Past Surgical History:  Procedure Laterality Date  . INTRAMEDULLARY (IM) NAIL INTERTROCHANTERIC Right 05/01/2017   Procedure: INTRAMEDULLARY (IM) NAIL INTERTROCHANTRIC;  Surgeon: Nicholes Stairs, MD;  Location: [facility];  Service: Orthopedics;  Laterality: Right;     reports that she has never smoked. She has never used smokeless tobacco. She reports that she does not drink alcohol or use drugs.  Allergies  Allergen Reactions  . Cerefolin [L-Methylfolate-B12-B6-B2] Nausea Only  . Codeine Nausea And Vomiting and Other (See Comments)    Dizziness   . Lactose Intolerance (Gi) Other (See Comments)    Constipation and bloating (patient has IBS) NO POTATOES, either NO RED MEAT  . Tape Other (See Comments)    Medical tape applied over the eyes results in a rash and irritation    No family history on  file.   Prior to Admission medications   Medication Sig Start Date End Date Taking? Authorizing Provider  amoxicillin-clavulanate (AUGMENTIN) 875-125 MG tablet Take 1 tablet by mouth 2 (two) times daily. 06/24/17 07/04/17 Yes Mikell, Jeani Sow, MD  celecoxib (CELEBREX) 100 MG capsule Take 1 capsule (100 mg total) by mouth 2 (two) times daily. 04/07/17  Yes Mikhail, Velta Addison, DO  DULoxetine (CYMBALTA) 60 MG capsule Take 60 mg by mouth at bedtime.  12/22/16  Yes [provider]  glycerin adult 2 g suppository Place 1 suppository rectally as needed for constipation. 06/24/17  Yes Mikell, Jeani Sow, MD  HYDROcodone-acetaminophen (NORCO) 7.5-325 MG tablet Take 1-2 tablets by mouth every 6 (six) hours as needed (pain). 05/01/17  Yes Nicholes Stairs, MD  hydrocortisone 2.5 % cream Apply 1 application topically 2 (two) times daily as needed (rash).  11/06/16  Yes [provider]  magnesium hydroxide (MILK OF MAGNESIA) 400 MG/5ML suspension Take 15-30 mLs by mouth daily as needed for mild constipation.   Yes [provider]  ondansetron (ZOFRAN ODT) 4 MG disintegrating tablet Take 1 tablet (4 mg total) by mouth every 8 (eight) hours as needed for nausea or vomiting. 06/24/17  Yes Carmon Sails J, PA-C  pantoprazole (PROTONIX) 40 MG tablet Take 40 mg by mouth daily. 01/19/17  Yes [provider]  polyethylene glycol (MIRALAX / GLYCOLAX) packet Take 34 g by mouth at bedtime. Mix in 6-8 oz  liquid and drink   Yes [provider]  rOPINIRole (REQUIP) 0.25 MG tablet Take 1 tablet (0.25 mg total) by mouth daily as needed (severe restless legs). Patient taking differently: Take 0.25 mg by mouth at bedtime as needed (for severely restless legs).  02/10/17  Yes Domenic Polite, MD  senna-docusate (SENOKOT-S) 8.6-50 MG tablet Take 1 tablet by mouth 2 (two) times daily as needed for mild constipation. 05/04/17  Yes Short, Noah Delaine, MD  Tetrahydrozoline HCl (VISINE OP) Place  1 drop into both eyes 2 (two) times daily as needed (for dry eyes).    Yes [provider]  triazolam (HALCION) 0.25 MG tablet Take 0.25 mg by mouth at bedtime. 01/22/17  Yes [provider]  Respiratory Therapy Supplies (FLUTTER) DEVI Blow into device 3 times in a row twice daily. 04/17/17   Javier Glazier, MD    Physical Exam: Vitals:   06/28/17 2300 06/28/17 2315 06/29/17 0030 06/29/17 0045  BP: 106/67 103/68 108/62 103/61  Pulse:  67 (!) 104 (!) 101  Resp:  18    Temp:      TempSrc:      SpO2:  99% 98% 93%  Weight:      Height:        Constitutional: NAD, calm, comfortable Eyes: PERRL, lids and conjunctivae normal ENMT: Mucous membranes are moist. Posterior pharynx clear of any exudate or lesions.Normal dentition.  Neck: normal, supple, no masses, no thyromegaly Respiratory: clear to auscultation bilaterally, no wheezing, no crackles. Normal respiratory effort. No accessory muscle use.  Cardiovascular: Regular rate and rhythm, no murmurs / rubs / gallops. No extremity edema. 2+ pedal pulses. No carotid bruits.  Abdomen: Distended, hypoactive BS.  Musculoskeletal: no clubbing / cyanosis. No joint deformity upper and lower extremities. Good ROM, no contractures. Normal muscle tone.  Skin: no rashes, lesions, ulcers. No induration Neurologic: CN 2-12 grossly intact. Sensation intact, DTR normal. Strength 5/5 in all 4.  Psychiatric: Normal judgment and insight. Alert and oriented x 3. Normal mood.    Labs on Admission: I have personally reviewed following labs and imaging studies  CBC:  Recent Labs Lab 06/24/17 1224 06/28/17 2055  WBC 6.2 6.9  NEUTROABS  --  6.0  HGB 10.3* 9.8*  HCT 32.2* 29.4*  MCV 102.2* 100.7*  PLT 399 161   Basic Metabolic Panel:  Recent Labs Lab 06/24/17 1224 06/28/17 2055  NA 139 133*  K 4.4 4.0  CL 101 98*  CO2 35* 24  GLUCOSE 96 105*  BUN 16 16  CREATININE 0.89 0.81  CALCIUM 9.9 9.4   GFR: Estimated Creatinine  Clearance: 38.3 mL/min (by C-G formula based on SCr of 0.81 mg/dL). Liver Function Tests:  Recent Labs Lab 06/24/17 1224 06/28/17 2055  AST 18 17  ALT 8* 8*  ALKPHOS 104 88  BILITOT 0.6 0.8  PROT 6.3* 6.3*  ALBUMIN 3.7 3.4*    Recent Labs Lab 06/24/17 1224 06/28/17 2055  LIPASE 22 23   No results for input(s): AMMONIA in the last 168 hours. Coagulation Profile: No results for input(s): INR, PROTIME in the last 168 hours. Cardiac Enzymes: No results for input(s): CKTOTAL, CKMB, CKMBINDEX, TROPONINI in the last 168 hours. BNP (last 3 results) No results for input(s): PROBNP in the last 8760 hours. HbA1C: No results for input(s): HGBA1C in the last 72 hours. CBG: No results for input(s): GLUCAP in the last 168 hours. Lipid Profile: No results for input(s): CHOL, HDL, LDLCALC, TRIG, CHOLHDL, LDLDIRECT in the last  72 hours. Thyroid Function Tests: No results for input(s): TSH, T4TOTAL, FREET4, T3FREE, THYROIDAB in the last 72 hours. Anemia Panel: No results for input(s): VITAMINB12, FOLATE, FERRITIN, TIBC, IRON, RETICCTPCT in the last 72 hours. Urine analysis:    Component Value Date/Time   COLORURINE AMBER (A) 06/24/2017 1900   APPEARANCEUR TURBID (A) 06/24/2017 1900   LABSPEC 1.016 06/24/2017 1900   PHURINE 7.0 06/24/2017 1900   GLUCOSEU NEGATIVE 06/24/2017 1900   HGBUR NEGATIVE 06/24/2017 1900   BILIRUBINUR NEGATIVE 06/24/2017 1900   KETONESUR NEGATIVE 06/24/2017 1900   PROTEINUR 30 (A) 06/24/2017 1900   NITRITE NEGATIVE 06/24/2017 1900   LEUKOCYTESUR NEGATIVE 06/24/2017 1900    Radiological Exams on Admission: Ct Abdomen Pelvis W Contrast  Result Date: 06/29/2017 CLINICAL DATA:  Abdominal pain and fever. Recent diagnosis of colitis. On antibiotics. No bowel movement for 5 days. Assess for abscess. EXAM: CT ABDOMEN AND PELVIS WITH CONTRAST TECHNIQUE: Multidetector CT imaging of the abdomen and pelvis was performed using the standard protocol following bolus  administration of intravenous contrast. CONTRAST:  133mL ISOVUE-300 IOPAMIDOL (ISOVUE-300) INJECTION 61% COMPARISON:  CT abdomen and pelvis June 24, 2017 an CT abdomen and pelvis March 18, 2017 FINDINGS: LOWER CHEST: Moderate RIGHT and small LEFT pleural effusions. Bibasilar atelectasis. Heart size is normal. No pericardial effusion. HEPATOBILIARY: 1 cm cyst LEFT lobe of the liver. Trace periportal edema. Distended gallbladder without acute cholecystitis by CT. PANCREAS: Normal. SPLEEN: Normal. ADRENALS/URINARY TRACT: Kidneys are orthotopic, demonstrating symmetric enhancement. No nephrolithiasis, hydronephrosis or solid renal masses. Too small to characterize hypodensities in the kidneys bilaterally. The unopacified ureters are normal in course and caliber. Delayed imaging through the kidneys demonstrates symmetric prompt contrast excretion within the proximal urinary collecting system. Urinary bladder is partially distended and unremarkable. Normal adrenal glands. STOMACH/BOWEL: Rectal wall thickening. Moderate amount of retained large bowel stool. Small and large bowel wall thickening. Enteric contrast has not yet reached the distal small bowel. Small bowel air-fluid levels. Proximal large bowel air feces levels. VASCULAR/LYMPHATIC: Aortoiliac vessels are normal in course and caliber. Mild calcific atherosclerosis. No lymphadenopathy by CT size criteria. REPRODUCTIVE: Normal. OTHER: Small amount of ascites. No focal fluid collection or free air. MUSCULOSKELETAL: Mild anasarca. RIGHT femur ORIF. Osteopenia. T10 through S1 posterior instrumentation, RIGHT T10 pedicle screw violates the superior cortex. Lower lumbar spine arthrodesis. IMPRESSION: 1. Worsening enterocolitis. Rectal wall thickening/proctitis ; given chronicity, neoplasm is possible. 2. Ileus.  Moderate amount of retained large bowel stool. 3. Small volume ascites.  No drainable fluid collection. 4. Similar moderate RIGHT and small LEFT pleural  effusions. Aortic Atherosclerosis (ICD10-I70.0). Electronically Signed   By: Elon Alas M.D.   On: 06/29/2017 00:26    EKG: Independently reviewed.  Assessment/Plan Principal Problem:   Colitis presumed infectious Active Problems:   Ileus ([place])    1. Colitis with ileus - failed outpatient ABx, CT cant r/o neoplasm 1. Zosyn empirically 2. NPO 3. IVF: NS at 75 cc/hr 4. BMP in AM 5. Call [facility] in AM for evaluation: according to patient it sounds like she actually had colonoscopy scheduled with them later this morning anyhow presumably as next step in work up.  DVT prophylaxis: Lovenox Code Status: DNR - confirmed with patient Family Communication: No family in room Disposition Plan: Home after admit Consults called: None Admission status: Admit to inpatient - failed outpatient ABx treatment, now with ileus   Etta Quill DO [facility] Pager [phone]  If 7AM-7PM, please contact day team taking care of patient  [email or web address] Password [email or web address]  06/29/2017, 2:06 AM

## 2017-06-29 NOTE — ED Notes (Signed)
Family at bedside. 

## 2017-06-29 NOTE — Progress Notes (Signed)
Attempted to place Flexiseal without success.  Pt's sphincter very small and pt c/o pain and asked RN to stop.  MD notified.  [name]

## 2017-06-29 NOTE — Progress Notes (Signed)
Attempted to place NGT in the right nare without success.  MD notified.  [name]

## 2017-06-29 NOTE — ED Notes (Signed)
Admitting MD at bedside.

## 2017-06-29 NOTE — Progress Notes (Addendum)
PROGRESS NOTE  [name]  MKL:[id number] DOB: 18-May-1930 DOA: 06/28/2017 PCP: Orpah Melter, MD  Brief Narrative:   The patient is an 83 yo F with history of IBS, HTN who presented to the ER with worsening abdominal distention and pain.  She had diarrhea about one week ago and was started on augmentin for enterocolitis in the ER four days prior to admission.  Her stool softeners were stopped.  No further BMs since that visit, but had worsening abdominal discomfort so came to the ER.    Assessment & Plan:   Principal Problem:   Colitis presumed infectious Active Problems:   Ileus ([place])  Sepsis secondary to enterocolitis (fevers, AMS, tachycardia, mild hypotension) with ileus -  NPO -  Hold oral medications -  NG to LIS -  IVF -  Continue zosyn -  F/u blood cultures -  Stool for GI pathogen panel -  [facility] consulted for assistance  Metabolic encephalopathy secondary to sepsis and ileus -  Frequent reorientation -  Treat pain and nausea  Possible dysphagia noted by nursing staff -  SLP consult when advancing diet -  Aspiration precautions  Arthritis -  Holding oral medications  IBS -  Holding oral medications  Bronchiectasis, at risk for pneumonia and acute on chronic respiratory failure  DVT prophylaxis:  lovenox Code Status:  DNR Family Communication:  Patient alone Disposition Plan:  Ill-appearing.  Unclear disposition at this time.  Likely to SNF depending on progression   Consultants:   [facility], Dr. Cristina Gong  Procedures:  none  Antimicrobials:  Anti-infectives    Start     Dose/Rate Route Frequency Ordered Stop   06/29/17 0600  piperacillin-tazobactam (ZOSYN) IVPB 3.375 g     3.375 g 12.5 mL/hr over 240 Minutes Intravenous Every 8 hours 06/29/17 0047     06/29/17 0045  piperacillin-tazobactam (ZOSYN) IVPB 3.375 g     3.375 g 100 mL/hr over 30 Minutes Intravenous  Once 06/29/17 0032 06/29/17 0122       Subjective: Delirious, unable to  provide much history currently.  Denies pain  Objective: Vitals:   06/29/17 0530 06/29/17 0600 06/29/17 0630 06/29/17 0700  BP: (!) 97/57 (!) 99/56 107/62 105/61  Pulse: (!) 106 97 100 95  Resp:    16  Temp:      TempSrc:      SpO2: 95% 96% 96% 97%  Weight:      Height:        Intake/Output Summary (Last 24 hours) at 06/29/17 0817 Last data filed at 06/29/17 0249  Gross per 24 hour  Intake              250 ml  Output                0 ml  Net              250 ml   Filed Weights   06/28/17 2035  Weight: 49.4 kg (109 lb)    Examination:  General exam:  Adult female, asleep but arouseable.  No acute distress.  I have met her many times and she has previously recognized me, but she did not today and barely registered that someone was in the room with her HEENT:  NCAT, MMM Respiratory system: Clear to auscultation bilaterally Cardiovascular system:  Tachycardic, IRRR, 2/6 systolic murmur.  Cool hands and feet Gastrointestinal system:  Present but high pitched bowel sounds, soft, moderately distended with visibly distended small bowel  loops, TTP diffusely without rebound or guarding MSK:  Normal tone and bulk, no lower extremity edema Neuro:  Grossly moves all extremities    Data Reviewed: I have personally reviewed following labs and imaging studies  CBC:  Recent Labs Lab 06/24/17 1224 06/28/17 2055 06/29/17 0645  WBC 6.2 6.9 4.9  NEUTROABS  --  6.0  --   HGB 10.3* 9.8* 9.2*  HCT 32.2* 29.4* 27.2*  MCV 102.2* 100.7* 101.1*  PLT 399 319 650   Basic Metabolic Panel:  Recent Labs Lab 06/24/17 1224 06/28/17 2055  NA 139 133*  K 4.4 4.0  CL 101 98*  CO2 35* 24  GLUCOSE 96 105*  BUN 16 16  CREATININE 0.89 0.81  CALCIUM 9.9 9.4   GFR: Estimated Creatinine Clearance: 38.3 mL/min (by C-G formula based on SCr of 0.81 mg/dL). Liver Function Tests:  Recent Labs Lab 06/24/17 1224 06/28/17 2055  AST 18 17  ALT 8* 8*  ALKPHOS 104 88  BILITOT 0.6 0.8  PROT  6.3* 6.3*  ALBUMIN 3.7 3.4*    Recent Labs Lab 06/24/17 1224 06/28/17 2055  LIPASE 22 23   No results for input(s): AMMONIA in the last 168 hours. Coagulation Profile: No results for input(s): INR, PROTIME in the last 168 hours. Cardiac Enzymes: No results for input(s): CKTOTAL, CKMB, CKMBINDEX, TROPONINI in the last 168 hours. BNP (last 3 results) No results for input(s): PROBNP in the last 8760 hours. HbA1C: No results for input(s): HGBA1C in the last 72 hours. CBG: No results for input(s): GLUCAP in the last 168 hours. Lipid Profile: No results for input(s): CHOL, HDL, LDLCALC, TRIG, CHOLHDL, LDLDIRECT in the last 72 hours. Thyroid Function Tests: No results for input(s): TSH, T4TOTAL, FREET4, T3FREE, THYROIDAB in the last 72 hours. Anemia Panel: No results for input(s): VITAMINB12, FOLATE, FERRITIN, TIBC, IRON, RETICCTPCT in the last 72 hours. Urine analysis:    Component Value Date/Time   COLORURINE AMBER (A) 06/24/2017 1900   APPEARANCEUR TURBID (A) 06/24/2017 1900   LABSPEC 1.016 06/24/2017 1900   PHURINE 7.0 06/24/2017 1900   GLUCOSEU NEGATIVE 06/24/2017 1900   HGBUR NEGATIVE 06/24/2017 1900   BILIRUBINUR NEGATIVE 06/24/2017 1900   KETONESUR NEGATIVE 06/24/2017 1900   PROTEINUR 30 (A) 06/24/2017 1900   NITRITE NEGATIVE 06/24/2017 1900   LEUKOCYTESUR NEGATIVE 06/24/2017 1900   Sepsis Labs: _0 (procalcitonin:4,lacticidven:4)  )No results found for this or any previous visit (from the past 240 hour(s)).    Radiology Studies: Ct Abdomen Pelvis W Contrast  Result Date: 06/29/2017 CLINICAL DATA:  Abdominal pain and fever. Recent diagnosis of colitis. On antibiotics. No bowel movement for 5 days. Assess for abscess. EXAM: CT ABDOMEN AND PELVIS WITH CONTRAST TECHNIQUE: Multidetector CT imaging of the abdomen and pelvis was performed using the standard protocol following bolus administration of intravenous contrast. CONTRAST:  139m ISOVUE-300 IOPAMIDOL  (ISOVUE-300) INJECTION 61% COMPARISON:  CT abdomen and pelvis June 24, 2017 an CT abdomen and pelvis March 18, 2017 FINDINGS: LOWER CHEST: Moderate RIGHT and small LEFT pleural effusions. Bibasilar atelectasis. Heart size is normal. No pericardial effusion. HEPATOBILIARY: 1 cm cyst LEFT lobe of the liver. Trace periportal edema. Distended gallbladder without acute cholecystitis by CT. PANCREAS: Normal. SPLEEN: Normal. ADRENALS/URINARY TRACT: Kidneys are orthotopic, demonstrating symmetric enhancement. No nephrolithiasis, hydronephrosis or solid renal masses. Too small to characterize hypodensities in the kidneys bilaterally. The unopacified ureters are normal in course and caliber. Delayed imaging through the kidneys demonstrates symmetric prompt contrast excretion within the proximal urinary collecting system.  Urinary bladder is partially distended and unremarkable. Normal adrenal glands. STOMACH/BOWEL: Rectal wall thickening. Moderate amount of retained large bowel stool. Small and large bowel wall thickening. Enteric contrast has not yet reached the distal small bowel. Small bowel air-fluid levels. Proximal large bowel air feces levels. VASCULAR/LYMPHATIC: Aortoiliac vessels are normal in course and caliber. Mild calcific atherosclerosis. No lymphadenopathy by CT size criteria. REPRODUCTIVE: Normal. OTHER: Small amount of ascites. No focal fluid collection or free air. MUSCULOSKELETAL: Mild anasarca. RIGHT femur ORIF. Osteopenia. T10 through S1 posterior instrumentation, RIGHT T10 pedicle screw violates the superior cortex. Lower lumbar spine arthrodesis. IMPRESSION: 1. Worsening enterocolitis. Rectal wall thickening/proctitis ; given chronicity, neoplasm is possible. 2. Ileus.  Moderate amount of retained large bowel stool. 3. Small volume ascites.  No drainable fluid collection. 4. Similar moderate RIGHT and small LEFT pleural effusions. Aortic Atherosclerosis (ICD10-I70.0). Electronically Signed   By:  Elon Alas M.D.   On: 06/29/2017 00:26     Scheduled Meds: . enoxaparin (LOVENOX) injection  40 mg Subcutaneous Q24H  . iopamidol      . pantoprazole (PROTONIX) IV  40 mg Intravenous Daily   Continuous Infusions: . dextrose 5% lactated ringers    . piperacillin-tazobactam (ZOSYN)  IV 3.375 g (06/29/17 0600)     LOS: 0 days    Time spent: 30 min    Janece Canterbury, MD [facility] Pager [phone]  If 7PM-7AM, please contact night-coverage [email or web address] Password [facility] 06/29/2017, 8:17 AM

## 2017-06-29 NOTE — Consult Note (Signed)
Referring Provider: Dr. Sheran Fava Primary Care Physician:  Orpah Melter, MD   Reason for Consultation:  abd distension   PLEASE SEE SEPARATE CONSULTATION NOTE FROM TODAY   Cleotis Nipper, M.D. Pager [phone] If no answer or after 5 PM call [phone]

## 2017-06-29 NOTE — Consult Note (Signed)
Referring Provider:   Dr. Janece Canterbury ([facility]) Primary Care Physician:  Orpah Melter, MD Primary Gastroenterologist:  Dr. Teena Irani  Reason for Consultation:  Abdominal distention, abnormal CT  HPI: [name] is a 83 y.o. female admitted through the emergency room today because of increasing abdominal discomfort and overall poor status with possible sepsis.  The patient has a long-standing history of bowel habit irregularities, and was seen by the physician assistant in our office on July 12, when the patient had noticed a small amount of bright red blood per rectum. Digital exam at that time showed anal stenosis and a small amount of bright red blood.  The patient has apparently never had colonoscopic evaluation.  A CT scan was obtained in July which showed rectal thickening, raising the question of possible rectal cancer.  The patient's constipation was managed by MiraLAX.  The patient sustained a hip fracture requiring surgical repair a couple of weeks thereafter. She was already on hydrocodone for arthritic pain. Recent telephone calls to our office indicated abdominal distention and inability to have a bowel movement; 5 days ago, she was sent to the emergency room where CT scan showed small bowel bowel wall thickening as well as the previously noted rectal thickening, so the patient was started on Augmentin for possible colitis and an urgent outpatient flexible sigmoidoscopy was arranged to be done today, but in the meantime, because of intensified symptoms, the patient came to the emergency room last night, where an updated CT shows retained stool, air-fluid levels, and more extensive bowel wall thickening as well as a small amount of ascites.   The patient was febrile to 100.7 although no leukocytosis was present; she had diminished mental status with some degree of delirium. There was obvious abdominal distention and in view of her symptoms and the change in her  overall status, we were asked to see the patient.     Past Medical History:  Diagnosis Date  . Arthritis   . Bronchiectasis ([place])   . Hypertension   . IBS (irritable bowel syndrome)   . Pneumonia     Past Surgical History:  Procedure Laterality Date  . INTRAMEDULLARY (IM) NAIL INTERTROCHANTERIC Right 05/01/2017   Procedure: INTRAMEDULLARY (IM) NAIL INTERTROCHANTRIC;  Surgeon: Nicholes Stairs, MD;  Location: [place];  Service: Orthopedics;  Laterality: Right;    Prior to Admission medications   Medication Sig Start Date End Date Taking? Authorizing Provider  amoxicillin-clavulanate (AUGMENTIN) 875-125 MG tablet Take 1 tablet by mouth 2 (two) times daily. 06/24/17 07/04/17 Yes Mikell, Jeani Sow, MD  celecoxib (CELEBREX) 100 MG capsule Take 1 capsule (100 mg total) by mouth 2 (two) times daily. 04/07/17  Yes Mikhail, Velta Addison, DO  DULoxetine (CYMBALTA) 60 MG capsule Take 60 mg by mouth at bedtime.  12/22/16  Yes [provider]  glycerin adult 2 g suppository Place 1 suppository rectally as needed for constipation. 06/24/17  Yes Mikell, Jeani Sow, MD  HYDROcodone-acetaminophen (NORCO) 7.5-325 MG tablet Take 1-2 tablets by mouth every 6 (six) hours as needed (pain). 05/01/17  Yes Nicholes Stairs, MD  hydrocortisone 2.5 % cream Apply 1 application topically 2 (two) times daily as needed (rash).  11/06/16  Yes [provider]  magnesium hydroxide (MILK OF MAGNESIA) 400 MG/5ML suspension Take 15-30 mLs by mouth daily as needed for mild constipation.   Yes [provider]  ondansetron (ZOFRAN ODT) 4 MG disintegrating tablet Take 1 tablet (4 mg total) by mouth every 8 (eight) hours  as needed for nausea or vomiting. 06/24/17  Yes Kinnie Feil, PA-C  pantoprazole (PROTONIX) 40 MG tablet Take 40 mg by mouth daily. 01/19/17  Yes [provider]  polyethylene glycol (MIRALAX / GLYCOLAX) packet Take 34 g by mouth at bedtime. Mix in 6-8 oz liquid and drink    Yes [provider]  rOPINIRole (REQUIP) 0.25 MG tablet Take 1 tablet (0.25 mg total) by mouth daily as needed (severe restless legs). Patient taking differently: Take 0.25 mg by mouth at bedtime as needed (for severely restless legs).  02/10/17  Yes Domenic Polite, MD  senna-docusate (SENOKOT-S) 8.6-50 MG tablet Take 1 tablet by mouth 2 (two) times daily as needed for mild constipation. 05/04/17  Yes Short, Noah Delaine, MD  Tetrahydrozoline HCl (VISINE OP) Place 1 drop into both eyes 2 (two) times daily as needed (for dry eyes).    Yes [provider]  triazolam (HALCION) 0.25 MG tablet Take 0.25 mg by mouth at bedtime. 01/22/17  Yes [provider]  Respiratory Therapy Supplies (FLUTTER) DEVI Blow into device 3 times in a row twice daily. 04/17/17   Javier Glazier, MD    Current Facility-Administered Medications  Medication Dose Route Frequency Provider Last Rate Last Dose  . dextrose 5 % in lactated ringers infusion   Intravenous Continuous Janece Canterbury, MD 100 mL/hr at 06/29/17 0830    . enoxaparin (LOVENOX) injection 40 mg  40 mg Subcutaneous Q24H Jennette Kettle M, DO      . Glycerin (Adult) 2.1 g suppository 1 suppository  1 suppository Rectal PRN Etta Quill, DO      . morphine 4 MG/ML injection 2 mg  2 mg Intravenous Q2H PRN Etta Quill, DO   2 mg at 06/29/17 0246  . ondansetron (ZOFRAN) tablet 4 mg  4 mg Oral Q6H PRN Etta Quill, DO       Or  . ondansetron [facility]) injection 4 mg  4 mg Intravenous Q6H PRN Etta Quill, DO      . pantoprazole (PROTONIX) injection 40 mg  40 mg Intravenous Daily Short, Mackenzie, MD      . piperacillin-tazobactam (ZOSYN) IVPB 3.375 g  3.375 g Intravenous Q8H Laren Everts, RPH   Stopped at 06/29/17 3664   Current Outpatient Prescriptions  Medication Sig Dispense Refill  . amoxicillin-clavulanate (AUGMENTIN) 875-125 MG tablet Take 1 tablet by mouth 2 (two) times daily. 20 tablet 0  . celecoxib (CELEBREX)  100 MG capsule Take 1 capsule (100 mg total) by mouth 2 (two) times daily. 60 capsule 0  . DULoxetine (CYMBALTA) 60 MG capsule Take 60 mg by mouth at bedtime.   5  . glycerin adult 2 g suppository Place 1 suppository rectally as needed for constipation. 12 suppository 0  . HYDROcodone-acetaminophen (NORCO) 7.5-325 MG tablet Take 1-2 tablets by mouth every 6 (six) hours as needed (pain). 45 tablet 0  . hydrocortisone 2.5 % cream Apply 1 application topically 2 (two) times daily as needed (rash).   1  . magnesium hydroxide (MILK OF MAGNESIA) 400 MG/5ML suspension Take 15-30 mLs by mouth daily as needed for mild constipation.    . ondansetron (ZOFRAN ODT) 4 MG disintegrating tablet Take 1 tablet (4 mg total) by mouth every 8 (eight) hours as needed for nausea or vomiting. 20 tablet 0  . pantoprazole (PROTONIX) 40 MG tablet Take 40 mg by mouth daily.  5  . polyethylene glycol (MIRALAX / GLYCOLAX) packet Take 34 g by mouth at  bedtime. Mix in 6-8 oz liquid and drink    . rOPINIRole (REQUIP) 0.25 MG tablet Take 1 tablet (0.25 mg total) by mouth daily as needed (severe restless legs). (Patient taking differently: Take 0.25 mg by mouth at bedtime as needed (for severely restless legs). ) 30 tablet 0  . senna-docusate (SENOKOT-S) 8.6-50 MG tablet Take 1 tablet by mouth 2 (two) times daily as needed for mild constipation. 60 tablet 0  . Tetrahydrozoline HCl (VISINE OP) Place 1 drop into both eyes 2 (two) times daily as needed (for dry eyes).     . triazolam (HALCION) 0.25 MG tablet Take 0.25 mg by mouth at bedtime.  5  . Respiratory Therapy Supplies (FLUTTER) DEVI Blow into device 3 times in a row twice daily. 1 each 0    Allergies as of 06/28/2017 - Review Complete 06/28/2017  Allergen Reaction Noted  . Cerefolin [l-methylfolate-b12-b6-b2] Nausea Only 02/06/2017  . Codeine Nausea And Vomiting and Other (See Comments) 02/06/2017  . Lactose intolerance (gi) Other (See Comments) 02/06/2017  . Tape Other (See  Comments) 06/24/2017    No family history on file.  Social History   Social History  . Marital status: Widowed    Spouse name: N/A  . Number of children: N/A  . Years of education: N/A   Occupational History  . Not on file.   Social History Main Topics  . Smoking status: Never Smoker  . Smokeless tobacco: Never Used  . Alcohol use No  . Drug use: No  . Sexual activity: Not Currently   Other Topics Concern  . Not on file   Social History Narrative  . No narrative on file    Review of Systems: The patient is incoherent. She is oriented to her name, but does not remember that she had hip fracture surgery. As a consequence, I do not think the review of systems would be reliable in her case.  Physical Exam: Vital signs in last 24 hours: Temp:  [100.7 F (38.2 C)] 100.7 F (38.2 C) (09/23 2034) Pulse Rate:  [67-147] 99 (09/24 1000) Resp:  [16-18] 16 (09/24 1000) BP: (97-119)/(56-68) 100/62 (09/24 1000) SpO2:  [91 %-100 %] 98 % (09/24 1000) Weight:  [109 lb (49.4 kg)] 109 lb (49.4 kg) (09/23 2035)   This is a petite, elderly female resting comfortably in bed, in no acute distress. Recent temperature 100.7, blood pressure soft at 100/62, heart rate around 100, regular. She is anicteric and without frank pallor. The oropharynx has somewhat dry mucous membranes. There is no scleral icterus. I do not appreciate any cervical adenopathy. There is no thyromegaly. The chest is clear anteriorly, no wheezes or rales. No evident respiratory distress. Heart rhythm is regular but there is a 2/6 systolic murmur present, without gallop. The abdomen is distended, equivalent to about 7 months of pregnancy. It is significantly tympanitic, but soft, not tense. Bowel sounds are present but, at the time of my auscultation, were not particularly "gushing" or gurgling or obstructive in character. No succussion splash is present. No mass effect, organomegaly, guarding, tenderness, or peritoneal signs are  present. Rectal exam shows previously noted anal stenosis necessitating the use of my little finger to do a digital exam. No distal rectal masses are present and there is no fecal impaction. There is no visible blood on the examining finger, just liquid brown stool which was sent to the lab for Hemoccult testing. Neurologically, the patient is incoherent but alert, and without obvious focal motor deficits. No  tibial edema is present and no overt skin abnormalities are appreciated.   Intake/Output from previous day: 09/23 0701 - 09/24 0700 In: 250 [I.V.:200; IV Piggyback:50] Out: -  Intake/Output this shift: Total I/O In: 685 [I.V.:635; IV Piggyback:50] Out: -   Lab Results:  Recent Labs  06/28/17 2055 06/29/17 0645  WBC 6.9 4.9  HGB 9.8* 9.2*  HCT 29.4* 27.2*  PLT 319 249   BMET  Recent Labs  06/28/17 2055 06/29/17 0909  NA 133* 133*  K 4.0 4.1  CL 98* 102  CO2 24 23  GLUCOSE 105* 99  BUN 16 17  CREATININE 0.81 1.00  CALCIUM 9.4 8.5*   LFT  Recent Labs  06/28/17 2055  PROT 6.3*  ALBUMIN 3.4*  AST 17  ALT 8*  ALKPHOS 88  BILITOT 0.8   PT/INR No results for input(s): LABPROT, INR in the last 72 hours.  Studies/Results: Ct Abdomen Pelvis W Contrast  Result Date: 06/29/2017 CLINICAL DATA:  Abdominal pain and fever. Recent diagnosis of colitis. On antibiotics. No bowel movement for 5 days. Assess for abscess. EXAM: CT ABDOMEN AND PELVIS WITH CONTRAST TECHNIQUE: Multidetector CT imaging of the abdomen and pelvis was performed using the standard protocol following bolus administration of intravenous contrast. CONTRAST:  141mL ISOVUE-300 IOPAMIDOL (ISOVUE-300) INJECTION 61% COMPARISON:  CT abdomen and pelvis June 24, 2017 an CT abdomen and pelvis March 18, 2017 FINDINGS: LOWER CHEST: Moderate RIGHT and small LEFT pleural effusions. Bibasilar atelectasis. Heart size is normal. No pericardial effusion. HEPATOBILIARY: 1 cm cyst LEFT lobe of the liver. Trace periportal  edema. Distended gallbladder without acute cholecystitis by CT. PANCREAS: Normal. SPLEEN: Normal. ADRENALS/URINARY TRACT: Kidneys are orthotopic, demonstrating symmetric enhancement. No nephrolithiasis, hydronephrosis or solid renal masses. Too small to characterize hypodensities in the kidneys bilaterally. The unopacified ureters are normal in course and caliber. Delayed imaging through the kidneys demonstrates symmetric prompt contrast excretion within the proximal urinary collecting system. Urinary bladder is partially distended and unremarkable. Normal adrenal glands. STOMACH/BOWEL: Rectal wall thickening. Moderate amount of retained large bowel stool. Small and large bowel wall thickening. Enteric contrast has not yet reached the distal small bowel. Small bowel air-fluid levels. Proximal large bowel air feces levels. VASCULAR/LYMPHATIC: Aortoiliac vessels are normal in course and caliber. Mild calcific atherosclerosis. No lymphadenopathy by CT size criteria. REPRODUCTIVE: Normal. OTHER: Small amount of ascites. No focal fluid collection or free air. MUSCULOSKELETAL: Mild anasarca. RIGHT femur ORIF. Osteopenia. T10 through S1 posterior instrumentation, RIGHT T10 pedicle screw violates the superior cortex. Lower lumbar spine arthrodesis. IMPRESSION: 1. Worsening enterocolitis. Rectal wall thickening/proctitis ; given chronicity, neoplasm is possible. 2. Ileus.  Moderate amount of retained large bowel stool. 3. Small volume ascites.  No drainable fluid collection. 4. Similar moderate RIGHT and small LEFT pleural effusions. Aortic Atherosclerosis (ICD10-I70.0). Electronically Signed   By: Elon Alas M.D.   On: 06/29/2017 00:26    Impression: 1. Progressive abdominal distention and discomfort with ileus pattern on CT 2. Questionable rectal and bowel thickening of uncertain clinical significance. I have reviewed the films and am not overly impressed with the degree of thickening, and there is not  associated stranding to suggest significant intramural inflammation. 3. Retained stool with inability to have a bowel movement 4. Low-grade fever without leukocytosis, altered mental status  Discussion: Although the exact cause for the patient's change in status is not clear, I suspect that hydrocodone-induced colonic dysmotility, superimposed on underlying IBS, led to a colonic ileus which has progressed to a small bowel  ileus.   The patient does not have any electrolyte abnormalities, such as significant hypokalemia, to account for this condition.   I doubt mechanical intestinal obstruction since there is stool present all the way down to the rectum, although her anal stenosis may be contributing to difficulties with defecation.  RECOMMENDATION: 1. NG tube for intestinal decompression--could remove in 24 hrs if no significant output or evident benefit in terms of abdominal distension 2. Rectal tube to see if some liquid stool and/or gas can be vented out. 3. Check magnesium level 4. Follow labs and clinical exam --  Consider sigmoidoscopic evaluation to assess for colitis and/or rectal neoplasia (doubt) in the next couple of days, depending on patient's clinical evolution.   LOS: 0 days   Kenta Laster V  06/29/2017, 10:47 AM   Pager [phone] If no answer or after 5 PM call [phone]

## 2017-06-29 NOTE — Progress Notes (Signed)
Pharmacy Antibiotic Note  [name] is a 83 y.o. female admitted on 06/28/2017 with worsening enterocolitis despite outpt PO ABX.  Pharmacy has been consulted for Zosyn dosing.  Plan: Zosyn 3.375g IV q8h (4 hour infusion).  Height: 5\' 1"  (154.9 cm) Weight: 109 lb (49.4 kg) IBW/kg (Calculated) : 47.8  Temp (24hrs), Avg:100.7 F (38.2 C), Min:100.7 F (38.2 C), Max:100.7 F (38.2 C)   Recent Labs Lab 06/24/17 1224 06/28/17 2055 06/28/17 2109  WBC 6.2 6.9  --   CREATININE 0.89 0.81  --   LATICACIDVEN  --   --  1.51    Estimated Creatinine Clearance: 38.3 mL/min (by C-G formula based on SCr of 0.81 mg/dL).    Allergies  Allergen Reactions  . Cerefolin [L-Methylfolate-B12-B6-B2] Nausea Only  . Codeine Nausea And Vomiting and Other (See Comments)    Dizziness   . Lactose Intolerance (Gi) Other (See Comments)    Constipation and bloating (patient has IBS) NO POTATOES, either NO RED MEAT  . Tape Other (See Comments)    Medical tape applied over the eyes results in a rash and irritation    Thank you for allowing pharmacy to be a part of this patient's care.  Wynona Neat, PharmD, BCPS  06/29/2017 12:46 AM

## 2017-06-29 NOTE — ED Notes (Signed)
Called XR for pt's portable xray.

## 2017-06-29 NOTE — ED Notes (Signed)
After talking to MD, NG tube removed to due not being able to see on the XR. Attempted to place a replacement without success. MD notified.

## 2017-06-29 NOTE — ED Notes (Signed)
Lab called to report that BMet was grossly hemolyzed.

## 2017-06-30 ENCOUNTER — Inpatient Hospital Stay (HOSPITAL_COMMUNITY): Payer: Medicare Other

## 2017-06-30 DIAGNOSIS — R000 Tachycardia, unspecified: Secondary | ICD-10-CM

## 2017-06-30 DIAGNOSIS — D638 Anemia in other chronic diseases classified elsewhere: Secondary | ICD-10-CM

## 2017-06-30 LAB — COMPREHENSIVE METABOLIC PANEL
ALK PHOS: 92 U/L (ref 38–126)
ALT: 9 U/L — AB (ref 14–54)
AST: 17 U/L (ref 15–41)
Albumin: 2.7 g/dL — ABNORMAL LOW (ref 3.5–5.0)
Anion gap: 10 (ref 5–15)
BILIRUBIN TOTAL: 0.8 mg/dL (ref 0.3–1.2)
BUN: 18 mg/dL (ref 6–20)
CHLORIDE: 100 mmol/L — AB (ref 101–111)
CO2: 23 mmol/L (ref 22–32)
CREATININE: 0.91 mg/dL (ref 0.44–1.00)
Calcium: 8.3 mg/dL — ABNORMAL LOW (ref 8.9–10.3)
GFR calc Af Amer: >60 mL/min (ref >60)
GFR, EST NON AFRICAN AMERICAN: 56 mL/min — AB (ref >60)
Glucose, Bld: 123 mg/dL — ABNORMAL HIGH (ref 65–99)
Potassium: 3.3 mmol/L — ABNORMAL LOW (ref 3.5–5.1)
Sodium: 133 mmol/L — ABNORMAL LOW (ref 135–145)
TOTAL PROTEIN: 5.2 g/dL — AB (ref 6.5–8.1)

## 2017-06-30 LAB — CBC
HEMATOCRIT: 32.1 % — AB (ref 36.0–46.0)
Hemoglobin: 10.3 g/dL — ABNORMAL LOW (ref 12.0–15.0)
MCH: 33.4 pg (ref 26.0–34.0)
MCHC: 32.1 g/dL (ref 30.0–36.0)
MCV: 104.2 fL — ABNORMAL HIGH (ref 78.0–100.0)
Platelets: 260 10*3/uL (ref 150–400)
RBC: 3.08 MIL/uL — ABNORMAL LOW (ref 3.87–5.11)
RDW: 14.5 % (ref 11.5–15.5)
WBC: 8.2 10*3/uL (ref 4.0–10.5)

## 2017-06-30 LAB — GASTROINTESTINAL PANEL BY PCR, STOOL (REPLACES STOOL CULTURE)
ADENOVIRUS F40/41: NOT DETECTED
ASTROVIRUS: NOT DETECTED
CAMPYLOBACTER SPECIES: NOT DETECTED
Cryptosporidium: NOT DETECTED
Cyclospora cayetanensis: NOT DETECTED
ENTAMOEBA HISTOLYTICA: NOT DETECTED
ENTEROPATHOGENIC E COLI (EPEC): NOT DETECTED
ENTEROTOXIGENIC E COLI (ETEC): NOT DETECTED
Enteroaggregative E coli (EAEC): NOT DETECTED
Giardia lamblia: NOT DETECTED
NOROVIRUS GI/GII: NOT DETECTED
Plesimonas shigelloides: NOT DETECTED
Rotavirus A: NOT DETECTED
SAPOVIRUS (I, II, IV, AND V): NOT DETECTED
SHIGA LIKE TOXIN PRODUCING E COLI (STEC): NOT DETECTED
Salmonella species: NOT DETECTED
Shigella/Enteroinvasive E coli (EIEC): NOT DETECTED
VIBRIO CHOLERAE: NOT DETECTED
Vibrio species: NOT DETECTED
Yersinia enterocolitica: NOT DETECTED

## 2017-06-30 LAB — MAGNESIUM: MAGNESIUM: 2.1 mg/dL (ref 1.7–2.4)

## 2017-06-30 MED ORDER — MORPHINE SULFATE (PF) 4 MG/ML IV SOLN
4.0000 mg | INTRAVENOUS | Status: DC | PRN
  Administered 2017-06-30 – 2017-07-02 (×4): 4 mg via INTRAVENOUS
  Filled 2017-06-30 (×3): qty 1

## 2017-06-30 MED ORDER — LORAZEPAM 2 MG/ML IJ SOLN: 0.5000 mg | INTRAMUSCULAR | Status: DC | PRN

## 2017-06-30 MED ORDER — ACETAMINOPHEN 325 MG PO TABS
650.0000 mg | ORAL_TABLET | Freq: Four times a day (QID) | ORAL | Status: DC | PRN
  Administered 2017-06-30: 650 mg via ORAL
  Filled 2017-06-30: qty 2

## 2017-06-30 MED ORDER — KETOROLAC TROMETHAMINE 15 MG/ML IJ SOLN: 7.5000 mg | Freq: Four times a day (QID) | INTRAMUSCULAR | Status: DC

## 2017-06-30 MED ORDER — SACCHAROMYCES BOULARDII 250 MG PO CAPS
250.0000 mg | ORAL_CAPSULE | Freq: Two times a day (BID) | ORAL | Status: DC
  Filled 2017-06-30 (×2): qty 1

## 2017-06-30 MED ORDER — KETOROLAC TROMETHAMINE 15 MG/ML IJ SOLN
15.0000 mg | Freq: Four times a day (QID) | INTRAMUSCULAR | Status: AC
  Administered 2017-06-30 – 2017-07-01 (×4): 15 mg via INTRAVENOUS
  Filled 2017-06-30 (×4): qty 1

## 2017-06-30 MED ORDER — MORPHINE SULFATE (PF) 4 MG/ML IV SOLN
INTRAVENOUS | Status: AC
  Filled 2017-06-30: qty 1

## 2017-06-30 MED ORDER — KCL IN DEXTROSE-NACL 20-5-0.45 MEQ/L-%-% IV SOLN
INTRAVENOUS | Status: DC
  Administered 2017-06-30 – 2017-07-01 (×3): via INTRAVENOUS
  Filled 2017-06-30 (×4): qty 1000

## 2017-06-30 MED ORDER — LORAZEPAM 2 MG/ML IJ SOLN
0.5000 mg | INTRAMUSCULAR | Status: DC | PRN
  Administered 2017-07-01: 1 mg via INTRAVENOUS
  Administered 2017-07-01: 0.5 mg via INTRAVENOUS
  Filled 2017-06-30 (×2): qty 1

## 2017-06-30 NOTE — Progress Notes (Signed)
The patient looks much better than she did yesterday in the emergency room, lying in bed comfortably, no evident distress, much more alert, much more coherent.  However, the patient indicates that her abdominal pain is "much worse" compared to on admission. Her son, Konrad Dolores, who is at the bedside also feels that she has been in more pain and more distress today, although I wonder if that could be because she is more alert.  He indicates that the patient has been having significant abdominal pain issues for a long period of time and, frankly, he does not consider her recent status to be a marked difference from the way she has been previously, but rather perhaps a progression of symptoms. He basically think she is "going out."  The patient's bowel status is somewhat clouded by the fact that she has been on hydrocodone for a very long time for management of arthritis.  Her status in recent months has been affected by the death of one of her sons due to early Alzheimer's, in March of this year the day before Easter; she has also had 2 cases of pneumonia and a broken hip since that time.  On exam, the patient's abdomen is less tympanitic than yesterday, and perhaps slightly less distended, although still moderately protuberant. It is subjectively tender, no guarding or mass effect.  Impression: Abdominal pain of unclear cause. Bowel habit irregularity of long-standing. Anal stenosis. Questionable "colitis" on serial CT scans, although stool was Hemoccult negative yesterday.  Plan: I discussed options with the patient's son and daughter-in-law at the bedside, as well as with the patient. I explained that we could do a flexible sigmoidoscopy under sedation, unprepped, which would be of low risk and would give [place] some insight as to whether or not a treatable condition such as inflammatory colitis is present. However, neither the patient nor her family members are very enthused about putting her through further  testing, and realistically I explained that the yield of such a test for finding a treatable, fixable problem would be quite low.  The current plan is to continue observation on NG suction and see what her x-ray and symptoms are like tomorrow. My guess is that the patient and her family are moving more toward a palliative care mode.  Cleotis Nipper, M.D. Pager [phone] If no answer or after 5 PM call [phone]

## 2017-06-30 NOTE — Progress Notes (Signed)
Initial Nutrition Assessment  Late Entry for 07-07-17  DOCUMENTATION CODES:   Underweight  INTERVENTION:   -RD will follow for diet advancement and supplement as appropriate  NUTRITION DIAGNOSIS:   Inadequate oral intake related to altered GI function as evidenced by NPO status.  GOAL:   Patient will meet greater than or equal to 90% of their needs  MONITOR:   Diet advancement, Labs, Weight trends, Skin, I & O's  REASON FOR ASSESSMENT:   Malnutrition Screening Tool    ASSESSMENT:   [name] is a 83 y.o. female with medical history significant of IBS, HTN.  Patient presents to the ED with c/o progressively worsening abdominal pain.  Onset of abd pain with diarrhea one week ago.  Seen in ED 4 days ago, diagnosed with colitis on CT scan.  Started on augmentin course and discharged, stopped stool softeners.  Since that time no further BMs, but abd pain and now distention have worsened, no BM in 4-5 days.  Pt admitted with colitis with ileus.   Pt with NGT connected to low, intermittent suction; noted 900 ml output within the past 24 hours.   Pt unavailable at times of both visits. Unable to complete Nutrition-Focused physical exam or obtain further nutrition hx at this time.   Reviewed wt hx, which has been between 101-109# within the past 5 months.   Per GI notes, pt may be leading towards a more palliative approach.   Labs reviewed: Na: 133 (on IV supplementation), K: 3.3 (on IV supplementation).  Diet Order:  Diet NPO time specified Except for: Sips with Meds  Skin:  Reviewed, no issues  Last BM:  07/07/2017  Height:   Ht Readings from Last 1 Encounters:  06/29/17 5\' 2"  (1.575 m)    Weight:   Wt Readings from Last 1 Encounters:  06/29/17 101 lb (45.8 kg)    Ideal Body Weight:  50 kg  BMI:  Body mass index is 18.47 kg/m.  Estimated Nutritional Needs:   Kcal:  1150-1350  Protein:  50-65 grams  Fluid:  1.1-1.3 L  EDUCATION NEEDS:   No  education needs identified at this time  [name] A. Jimmye Norman, RD, LDN, CDE Pager: [phone] After hours Pager: [phone]

## 2017-06-30 NOTE — Progress Notes (Signed)
PROGRESS NOTE  [name]  JOA:[id number] DOB: [birth year] DOA: 06/28/2017 PCP: Orpah Melter, MD  Brief Narrative:   The patient is an 83 yo F with history of IBS, HTN who presented to the ER with worsening abdominal distention and pain.  She had diarrhea about one week ago and was started on augmentin for enterocolitis in the ER four days prior to admission.  Her stool softeners were stopped.  No further BMs since that visit, but had worsening abdominal discomfort so came to the ER.  She was found to have sepsis secondary to enterocolitis with ileus. She was started on antibiotics, IV fluids, NG tube. She is very frail and has been suffering from severe pains. Patient and her family have been interested in talking about goals of care and possible hospice if she does not continue to improve.  She had some clinical improvement over the first 24 hours and so we are giving her an additional 24 hours to see how she does. Palliative care has been consult to assist with symptom management and any decisions about transition of goals to comfort.  Assessment & Plan:   Principal Problem:   Colitis presumed infectious Active Problems:   Ileus ([place])  Sepsis secondary to enterocolitis (fevers, AMS, tachycardia, mild hypotension) with ileus -  NPO -  Hold oral medications -  Continue NG to LIS -  Continue IVF but high potassium -  Continue zosyn -  Blood cultures no growth to date -  Stool for GI pathogen panel -  [facility] consulted for assistance -  Increase morphine to 4mg  q2h prn -  Add toradol  Metabolic encephalopathy secondary to sepsis and ileus, improved today.  Much more alert -  Frequent reorientation -  Treat pain and nausea  Possible dysphagia noted by nursing staff -  SLP consult when advancing diet -  Aspiration precautions  Arthritis -  Holding oral medications  IBS -  Holding oral medications  Bronchiectasis, at risk for pneumonia and acute on chronic respiratory  failure  Anemia of chronic disease and acute illness -  Trend hemoglobin   DVT prophylaxis:  lovenox Code Status:  DNR Family Communication:  Patient and her extended family at bedside this morning and Tommy again by phone this afternoon  Disposition Plan:  Ill-appearing.  Unclear disposition at this time.     Consultants:   [facility], Dr. Cristina Gong  Procedures:  none  Antimicrobials:  Anti-infectives    Start     Dose/Rate Route Frequency Ordered Stop   06/29/17 0600  piperacillin-tazobactam (ZOSYN) IVPB 3.375 g     3.375 g 12.5 mL/hr over 240 Minutes Intravenous Every 8 hours 06/29/17 0047     06/29/17 0045  piperacillin-tazobactam (ZOSYN) IVPB 3.375 g     3.375 g 100 mL/hr over 30 Minutes Intravenous  Once 06/29/17 0032 06/29/17 0122       Subjective:  Continuing to have abdominal pains, unrelieved by morphine 2 mg. She has slightly less nausea than yesterday and feels like she is more alert and aware with going on. She has had multiple very small liquidy stools.  Objective: Vitals:   06/29/17 2111 06/30/17 0520 06/30/17 0645 06/30/17 1300  BP: 114/64 (!) 112/59  (!) 109/59  Pulse: 97 (!) 112  (!) 108  Resp: 17 18  18   Temp: 99 F (37.2 C) (!) 100.6 F (38.1 C) 99.8 F (37.7 C) 99.9 F (37.7 C)  TempSrc: Oral Oral  Oral  SpO2: 97% 96%  98%  Weight:      Height:        Intake/Output Summary (Last 24 hours) at 06/30/17 1840 Last data filed at 06/30/17 1600  Gross per 24 hour  Intake          2171.67 ml  Output             1450 ml  Net           721.67 ml   Filed Weights   06/28/17 2035 06/29/17 1803  Weight: 49.4 kg (109 lb) 45.8 kg (101 lb)    Examination:  General exam:  Frail appearing adult female, cachectic, pale.  No acute distress.  HEENT:  NCAT, MMM Respiratory system: Clear to auscultation bilaterally Cardiovascular system: Tachycardic, 2/6 systolic murmur.  Warm extremities Gastrointestinal system:  Rare high-pitched bowel sounds,  moderately distended with visible distended small bowel loops, tender to palpation diffusely without rebound or guarding.  Patient grimaces during palpation MSK:  Normal tone and bulk, no lower extremity edema Neuro:  Grossly intact   Data Reviewed: I have personally reviewed following labs and imaging studies  CBC:  Recent Labs Lab 06/24/17 1224 06/28/17 2055 06/29/17 0645 06/30/17 0348  WBC 6.2 6.9 4.9 8.2  NEUTROABS  --  6.0  --   --   HGB 10.3* 9.8* 9.2* 10.3*  HCT 32.2* 29.4* 27.2* 32.1*  MCV 102.2* 100.7* 101.1* 104.2*  PLT 399 319 249 854   Basic Metabolic Panel:  Recent Labs Lab 06/24/17 1224 06/28/17 2055 06/29/17 0909 06/30/17 0348  NA 139 133* 133* 133*  K 4.4 4.0 4.1 3.3*  CL 101 98* 102 100*  CO2 35* 24 23 23   GLUCOSE 96 105* 99 123*  BUN 16 16 17 18   CREATININE 0.89 0.81 1.00 0.91  CALCIUM 9.9 9.4 8.5* 8.3*  MG  --   --  1.7 2.1   GFR: Estimated Creatinine Clearance: 32.7 mL/min (by C-G formula based on SCr of 0.91 mg/dL). Liver Function Tests:  Recent Labs Lab 06/24/17 1224 06/28/17 2055 06/30/17 0348  AST 18 17 17   ALT 8* 8* 9*  ALKPHOS 104 88 92  BILITOT 0.6 0.8 0.8  PROT 6.3* 6.3* 5.2*  ALBUMIN 3.7 3.4* 2.7*    Recent Labs Lab 06/24/17 1224 06/28/17 2055  LIPASE 22 23   No results for input(s): AMMONIA in the last 168 hours. Coagulation Profile: No results for input(s): INR, PROTIME in the last 168 hours. Cardiac Enzymes: No results for input(s): CKTOTAL, CKMB, CKMBINDEX, TROPONINI in the last 168 hours. BNP (last 3 results) No results for input(s): PROBNP in the last 8760 hours. HbA1C: No results for input(s): HGBA1C in the last 72 hours. CBG: No results for input(s): GLUCAP in the last 168 hours. Lipid Profile: No results for input(s): CHOL, HDL, LDLCALC, TRIG, CHOLHDL, LDLDIRECT in the last 72 hours. Thyroid Function Tests: No results for input(s): TSH, T4TOTAL, FREET4, T3FREE, THYROIDAB in the last 72 hours. Anemia  Panel: No results for input(s): VITAMINB12, FOLATE, FERRITIN, TIBC, IRON, RETICCTPCT in the last 72 hours. Urine analysis:    Component Value Date/Time   COLORURINE AMBER (A) 06/24/2017 1900   APPEARANCEUR TURBID (A) 06/24/2017 1900   LABSPEC 1.016 06/24/2017 1900   PHURINE 7.0 06/24/2017 1900   GLUCOSEU NEGATIVE 06/24/2017 1900   HGBUR NEGATIVE 06/24/2017 1900   BILIRUBINUR NEGATIVE 06/24/2017 1900   KETONESUR NEGATIVE 06/24/2017 1900   PROTEINUR 30 (A) 06/24/2017 1900   NITRITE NEGATIVE 06/24/2017 1900   LEUKOCYTESUR NEGATIVE  06/24/2017 1900   Sepsis Labs: @LABRCNTIP (procalcitonin:4,lacticidven:4)  ) Recent Results (from the past 240 hour(s))  Gastrointestinal Panel by PCR , Stool     Status: None   Collection Time: 06/29/17  8:21 AM  Result Value Ref Range Status   Campylobacter species NOT DETECTED NOT DETECTED Final   Plesimonas shigelloides NOT DETECTED NOT DETECTED Final   Salmonella species NOT DETECTED NOT DETECTED Final   Yersinia enterocolitica NOT DETECTED NOT DETECTED Final   Vibrio species NOT DETECTED NOT DETECTED Final   Vibrio cholerae NOT DETECTED NOT DETECTED Final   Enteroaggregative E coli (EAEC) NOT DETECTED NOT DETECTED Final   Enteropathogenic E coli (EPEC) NOT DETECTED NOT DETECTED Final   Enterotoxigenic E coli (ETEC) NOT DETECTED NOT DETECTED Final   Shiga like toxin producing E coli (STEC) NOT DETECTED NOT DETECTED Final   Shigella/Enteroinvasive E coli (EIEC) NOT DETECTED NOT DETECTED Final   Cryptosporidium NOT DETECTED NOT DETECTED Final   Cyclospora cayetanensis NOT DETECTED NOT DETECTED Final   Entamoeba histolytica NOT DETECTED NOT DETECTED Final   Giardia lamblia NOT DETECTED NOT DETECTED Final   Adenovirus F40/41 NOT DETECTED NOT DETECTED Final   Astrovirus NOT DETECTED NOT DETECTED Final   Norovirus GI/GII NOT DETECTED NOT DETECTED Final   Rotavirus A NOT DETECTED NOT DETECTED Final   Sapovirus (I, II, IV, and V) NOT DETECTED NOT  DETECTED Final      Radiology Studies: Dg Abd 1 View  Result Date: 06/29/2017 CLINICAL DATA:  Ileus EXAM: ABDOMEN - 1 VIEW COMPARISON:  06/29/2017, earlier the same day FINDINGS: Single fluoro image shows persistent gaseous bowel distention. NG tube tip projects at the level of the distal stomach. Extensive thoracolumbar fusion hardware again noted. IMPRESSION: Persisting gaseous bowel distension with NG tube tip projecting at the level of the distal stomach. Electronically Signed   By: Misty Stanley M.D.   On: 06/29/2017 16:06   Ct Abdomen Pelvis W Contrast  Result Date: 06/29/2017 CLINICAL DATA:  Abdominal pain and fever. Recent diagnosis of colitis. On antibiotics. No bowel movement for 5 days. Assess for abscess. EXAM: CT ABDOMEN AND PELVIS WITH CONTRAST TECHNIQUE: Multidetector CT imaging of the abdomen and pelvis was performed using the standard protocol following bolus administration of intravenous contrast. CONTRAST:  134mL ISOVUE-300 IOPAMIDOL (ISOVUE-300) INJECTION 61% COMPARISON:  CT abdomen and pelvis June 24, 2017 an CT abdomen and pelvis March 18, 2017 FINDINGS: LOWER CHEST: Moderate RIGHT and small LEFT pleural effusions. Bibasilar atelectasis. Heart size is normal. No pericardial effusion. HEPATOBILIARY: 1 cm cyst LEFT lobe of the liver. Trace periportal edema. Distended gallbladder without acute cholecystitis by CT. PANCREAS: Normal. SPLEEN: Normal. ADRENALS/URINARY TRACT: Kidneys are orthotopic, demonstrating symmetric enhancement. No nephrolithiasis, hydronephrosis or solid renal masses. Too small to characterize hypodensities in the kidneys bilaterally. The unopacified ureters are normal in course and caliber. Delayed imaging through the kidneys demonstrates symmetric prompt contrast excretion within the proximal urinary collecting system. Urinary bladder is partially distended and unremarkable. Normal adrenal glands. STOMACH/BOWEL: Rectal wall thickening. Moderate amount of  retained large bowel stool. Small and large bowel wall thickening. Enteric contrast has not yet reached the distal small bowel. Small bowel air-fluid levels. Proximal large bowel air feces levels. VASCULAR/LYMPHATIC: Aortoiliac vessels are normal in course and caliber. Mild calcific atherosclerosis. No lymphadenopathy by CT size criteria. REPRODUCTIVE: Normal. OTHER: Small amount of ascites. No focal fluid collection or free air. MUSCULOSKELETAL: Mild anasarca. RIGHT femur ORIF. Osteopenia. T10 through S1 posterior instrumentation, RIGHT T10 pedicle screw  violates the superior cortex. Lower lumbar spine arthrodesis. IMPRESSION: 1. Worsening enterocolitis. Rectal wall thickening/proctitis ; given chronicity, neoplasm is possible. 2. Ileus.  Moderate amount of retained large bowel stool. 3. Small volume ascites.  No drainable fluid collection. 4. Similar moderate RIGHT and small LEFT pleural effusions. Aortic Atherosclerosis (ICD10-I70.0). Electronically Signed   By: Elon Alas M.D.   On: 06/29/2017 00:26   Dg Abd Portable 1v  Result Date: 06/30/2017 CLINICAL DATA:  Ileus EXAM: PORTABLE ABDOMEN - 1 VIEW COMPARISON:  Portable exam 1007 hours compared to 06/29/2017 FINDINGS: Tip of nasogastric tube projects over gastric antrum. Scattered gas within nondistended large and small bowel loops. Excreted contrast material in urinary bladder. No definite bowel dilatation or bowel wall thickening. Osseous demineralization with prior thoracolumbar fusion and proximal RIGHT femoral ORIF. IMPRESSION: Nonspecific bowel gas pattern. Decreased bowel distention since previous exam. Electronically Signed   By: Lavonia Dana M.D.   On: 06/30/2017 10:28   Dg Abd Portable 1 View  Result Date: 06/29/2017 CLINICAL DATA:  NG tube placement EXAM: PORTABLE ABDOMEN - 1 VIEW COMPARISON:  CT 06/28/2017 FINDINGS: No NG tube visualized in the abdomen or mid to lower chest. Mild cardiomegaly and vascular congestion. Gaseous distention  of bowel noted, similar to prior CT. No free air organomegaly. IMPRESSION: No NG tube visualized in the mid to lower chest or abdomen. Electronically Signed   By: Rolm Baptise M.D.   On: 06/29/2017 13:44     Scheduled Meds: . enoxaparin (LOVENOX) injection  40 mg Subcutaneous Q24H  . ketorolac  15 mg Intravenous Q6H  . pantoprazole (PROTONIX) IV  40 mg Intravenous Daily  . saccharomyces boulardii  250 mg Oral BID   Continuous Infusions: . dextrose 5 % and 0.45 % NaCl with KCl 20 mEq/L 100 mL/hr at 06/30/17 1812  . piperacillin-tazobactam (ZOSYN)  IV Stopped (06/30/17 1906)     LOS: 1 day    Time spent: 30 min    Janece Canterbury, MD [facility] Pager [phone]  If 7PM-7AM, please contact night-coverage [email or web address] Password [facility] 06/30/2017, 6:40 PM

## 2017-07-01 ENCOUNTER — Inpatient Hospital Stay (HOSPITAL_COMMUNITY): Payer: Medicare Other

## 2017-07-01 DIAGNOSIS — K567 Ileus, unspecified: Secondary | ICD-10-CM

## 2017-07-01 DIAGNOSIS — K529 Noninfective gastroenteritis and colitis, unspecified: Secondary | ICD-10-CM

## 2017-07-01 DIAGNOSIS — D638 Anemia in other chronic diseases classified elsewhere: Secondary | ICD-10-CM

## 2017-07-01 LAB — CBC
HEMATOCRIT: 30.8 % — AB (ref 36.0–46.0)
HEMOGLOBIN: 9.9 g/dL — AB (ref 12.0–15.0)
MCH: 32.6 pg (ref 26.0–34.0)
MCHC: 32.1 g/dL (ref 30.0–36.0)
MCV: 101.3 fL — AB (ref 78.0–100.0)
Platelets: 261 10*3/uL (ref 150–400)
RBC: 3.04 MIL/uL — ABNORMAL LOW (ref 3.87–5.11)
RDW: 14.3 % (ref 11.5–15.5)
WBC: 10.2 10*3/uL (ref 4.0–10.5)

## 2017-07-01 LAB — COMPREHENSIVE METABOLIC PANEL
ALBUMIN: 2.5 g/dL — AB (ref 3.5–5.0)
ALT: 9 U/L — AB (ref 14–54)
AST: 16 U/L (ref 15–41)
Alkaline Phosphatase: 81 U/L (ref 38–126)
Anion gap: 9 (ref 5–15)
BUN: 20 mg/dL (ref 6–20)
CHLORIDE: 100 mmol/L — AB (ref 101–111)
CO2: 24 mmol/L (ref 22–32)
CREATININE: 1.02 mg/dL — AB (ref 0.44–1.00)
Calcium: 8.1 mg/dL — ABNORMAL LOW (ref 8.9–10.3)
GFR calc Af Amer: 56 mL/min — ABNORMAL LOW (ref >60)
GFR, EST NON AFRICAN AMERICAN: 49 mL/min — AB (ref >60)
GLUCOSE: 116 mg/dL — AB (ref 65–99)
POTASSIUM: 3.4 mmol/L — AB (ref 3.5–5.1)
Sodium: 133 mmol/L — ABNORMAL LOW (ref 135–145)
Total Bilirubin: 0.6 mg/dL (ref 0.3–1.2)
Total Protein: 5.3 g/dL — ABNORMAL LOW (ref 6.5–8.1)

## 2017-07-01 LAB — MAGNESIUM: MAGNESIUM: 1.8 mg/dL (ref 1.7–2.4)

## 2017-07-01 MED ORDER — ENOXAPARIN SODIUM 30 MG/0.3ML ~~LOC~~ SOLN
30.0000 mg | SUBCUTANEOUS | Status: DC
  Administered 2017-07-01: 30 mg via SUBCUTANEOUS
  Filled 2017-07-01: qty 0.3

## 2017-07-01 MED ORDER — ORAL CARE MOUTH RINSE
15.0000 mL | Freq: Two times a day (BID) | OROMUCOSAL | Status: DC
  Administered 2017-07-01 – 2017-07-02 (×3): 15 mL via OROMUCOSAL

## 2017-07-01 NOTE — Progress Notes (Signed)
[facility] Progress Note  [name] 83 y.o. 15-Feb-1930  CC:  Abdominal pain/abdominal distention   Subjective: patient seen and examined at bedside. Related to care team as well as family members at bedside.patient insist on removing NG tube because of discomfort. She had 2-3 bowel movements yesterday but no bowel movement today. Her abdomen remains distended.  ROS : positive for weakness and fatigue. Negative for chest pain.   Objective: Vital signs in last 24 hours: Vitals:   06/30/17 2120 07/01/17 0529  BP: (!) 97/57 (!) 104/56  Pulse: 70 (!) 105  Resp: 17 16  Temp: 99.1 F (37.3 C) 98.6 F (37 C)  SpO2: 100% 93%    Physical Exam:  General:  Alert, cooperative,  mild distress from NG tube.     Eyes:  , EOM's intact,   Lungs:   Clear to auscultation bilaterally, respirations unlabored  Heart:  Regular rate and rhythm   Abdomen:   Abdomen is distended with tympanic on percussion. Generalized abdominal discomfort on palpation. No peritoneal signs. Bowel sounds present but hypoactive.  Extremities: Extremities normal, atraumatic, no  edema       Lab Results:  Recent Labs  06/30/17 0348 07/01/17 0334  NA 133* 133*  K 3.3* 3.4*  CL 100* 100*  CO2 23 24  GLUCOSE 123* 116*  BUN 18 20  CREATININE 0.91 1.02*  CALCIUM 8.3* 8.1*  MG 2.1 1.8    Recent Labs  06/30/17 0348 07/01/17 0334  AST 17 16  ALT 9* 9*  ALKPHOS 92 81  BILITOT 0.8 0.6  PROT 5.2* 5.3*  ALBUMIN 2.7* 2.5*    Recent Labs  06/28/17 2055  06/30/17 0348 07/01/17 0334  WBC 6.9  < > 8.2 10.2  NEUTROABS 6.0  --   --   --   HGB 9.8*  < > 10.3* 9.9*  HCT 29.4*  < > 32.1* 30.8*  MCV 100.7*  < > 104.2* 101.3*  PLT 319  < > 260 261  < > = values in this interval not displayed. No results for input(s): LABPROT, INR in the last 72 hours.    Assessment/Plan: - abdominal distention. Most likely from ileus. Repeat x-ray today showed moderate stool in the colon and improvement  in ileus. - abnormal CT scan showing rectal wall thickening/proctitis.  Recommendations --------------------------- - X-ray today showed moderate stool in the colon and improvement in ileus, but her abdomen remains distended. Patient is insisting on removing NG tube because of discomfort.  trial of clamping NG tube for 4 hours, If continues to do better after clamping NG tube, okay to remove it from GI standpoint. If her abdominal distention/ discomfort gets worse after clamping NG tube, we may have  to put it back to intermittent suction. - family and patient is leaning towards comfort care. Currently being evaluated by palliative care team. - I had detailed discussion with the family member today in the presence of  palliative care team. All the questions were answered. - GI will follow.   Otis Brace MD, [place] 07/01/2017, 10:24 AM  Pager [phone]  If no answer or after 5 PM call [phone]

## 2017-07-01 NOTE — Progress Notes (Signed)
Patient didn't have any complaints of nausea or vomiting. Patient abdomen still distended. Patient would like NG tube removed. NG tube removed per order.

## 2017-07-01 NOTE — Progress Notes (Signed)
PROGRESS NOTE  [name]  DGU:[id number] DOB: May 22, 1930 DOA: 06/28/2017 PCP: Orpah Melter, MD  Brief Narrative:   The patient is an 83 yo F with history of IBS, HTN who presented to the ER with worsening abdominal distention and pain.  She had diarrhea about one week ago and was started on augmentin for enterocolitis in the ER four days prior to admission.  Her stool softeners were stopped.  No further BMs since that visit, but had worsening abdominal discomfort so came to the ER.  She was found to have sepsis secondary to enterocolitis with ileus. She was started on antibiotics, IV fluids, NG tube. She is very frail and has been suffering from severe pains. Patient and her family have been interested in talking about goals of care and possible hospice if she does not continue to improve.  GI and palliative care were consulted.  Assessment & Plan:   Principal Problem:   Colitis presumed infectious Active Problems:   Ileus ([place])   Sinus tachycardia   Anemia of chronic disease  Sepsis secondary to enterocolitis  - Hemodynamically stable currently. Continue IV fluids. - Continue nothing by mouth. - Cultures negative so far. Continue Zosyn  Enterocolitis and ileus - Continue Zosyn. NG tube is being clamped today as per GI recommendations. We'll follow further recommendations from GI. Patient is not interested in further procedures. - Continue nothing by mouth and IV fluids - Pain management. Patient states that pain is getting worse  Generalized deconditioning - Patient's condition is getting worse. Palliative care consult is pending. - If condition does not get better or worsens, recommend hospice/comfort measures  Metabolic encephalopathy secondary to sepsis and ileus -  Much more alert -  Frequent reorientation - Treat pain and nausea  IBS -  Holding oral medications  Bronchiectasis, at risk for pneumonia and acute on chronic respiratory failure - Monitor  respiratory status. Patient is at high risk for aspiration  Anemia of chronic disease and acute illness -  Trend hemoglobin   DVT prophylaxis:  lovenox Code Status:  DNR Family Communication:  Patient and daughter-in-law at bedside  Disposition Plan:  Unclear disposition at this time.     Consultants:  [facility]  Procedures:  none   Antimicrobials:  Anti-infectives    Start     Dose/Rate Route Frequency Ordered Stop   06/29/17 0600  piperacillin-tazobactam (ZOSYN) IVPB 3.375 g     3.375 g 12.5 mL/hr over 240 Minutes Intravenous Every 8 hours 06/29/17 0047     06/29/17 0045  piperacillin-tazobactam (ZOSYN) IVPB 3.375 g     3.375 g 100 mL/hr over 30 Minutes Intravenous  Once 06/29/17 0032 06/29/17 0122       Subjective: Patient seen and examined at bedside. She does not feel good. Her abdominal pain is getting worse. She has not had any bowel movement today.  Objective: Vitals:   06/30/17 0645 06/30/17 1300 06/30/17 2120 07/01/17 0529  BP:  (!) 109/59 (!) 97/57 (!) 104/56  Pulse:  (!) 108 70 (!) 105  Resp:  18 17 16   Temp: 99.8 F (37.7 C) 99.9 F (37.7 C) 99.1 F (37.3 C) 98.6 F (37 C)  TempSrc:  Oral Oral Axillary  SpO2:  98% 100% 93%  Weight:      Height:        Intake/Output Summary (Last 24 hours) at 07/01/17 1117 Last data filed at 07/01/17 0944  Gross per 24 hour  Intake  600 ml  Output             1350 ml  Net             -750 ml   Filed Weights   06/28/17 2035 06/29/17 1803  Weight: 49.4 kg (109 lb) 45.8 kg (101 lb)    Examination:  General exam:  Frail appearing adult female, cachectic, pale.  No acute distress.  Respiratory system: Bilateral decreased breath sounds at bases with some scattered crackles Cardiovascular system: S1-S2 positive, tachycardic Gastrointestinal system:  Soft, distended, diffusely tender without rebound or guarding.  Extremities: No cyanosis clubbing edema  Data Reviewed: I have  personally reviewed following labs and imaging studies  CBC:  Recent Labs Lab 06/24/17 1224 06/28/17 2055 06/29/17 0645 06/30/17 0348 07/01/17 0334  WBC 6.2 6.9 4.9 8.2 10.2  NEUTROABS  --  6.0  --   --   --   HGB 10.3* 9.8* 9.2* 10.3* 9.9*  HCT 32.2* 29.4* 27.2* 32.1* 30.8*  MCV 102.2* 100.7* 101.1* 104.2* 101.3*  PLT 399 319 249 260 403   Basic Metabolic Panel:  Recent Labs Lab 06/24/17 1224 06/28/17 2055 06/29/17 0909 06/30/17 0348 07/01/17 0334  NA 139 133* 133* 133* 133*  K 4.4 4.0 4.1 3.3* 3.4*  CL 101 98* 102 100* 100*  CO2 35* 24 23 23 24   GLUCOSE 96 105* 99 123* 116*  BUN 16 16 17 18 20   CREATININE 0.89 0.81 1.00 0.91 1.02*  CALCIUM 9.9 9.4 8.5* 8.3* 8.1*  MG  --   --  1.7 2.1 1.8   GFR: Estimated Creatinine Clearance: 29.2 mL/min (A) (by C-G formula based on SCr of 1.02 mg/dL (H)). Liver Function Tests:  Recent Labs Lab 06/24/17 1224 06/28/17 2055 06/30/17 0348 07/01/17 0334  AST 18 17 17 16   ALT 8* 8* 9* 9*  ALKPHOS 104 88 92 81  BILITOT 0.6 0.8 0.8 0.6  PROT 6.3* 6.3* 5.2* 5.3*  ALBUMIN 3.7 3.4* 2.7* 2.5*    Recent Labs Lab 06/24/17 1224 06/28/17 2055  LIPASE 22 23   No results for input(s): AMMONIA in the last 168 hours. Coagulation Profile: No results for input(s): INR, PROTIME in the last 168 hours. Cardiac Enzymes: No results for input(s): CKTOTAL, CKMB, CKMBINDEX, TROPONINI in the last 168 hours. BNP (last 3 results) No results for input(s): PROBNP in the last 8760 hours. HbA1C: No results for input(s): HGBA1C in the last 72 hours. CBG: No results for input(s): GLUCAP in the last 168 hours. Lipid Profile: No results for input(s): CHOL, HDL, LDLCALC, TRIG, CHOLHDL, LDLDIRECT in the last 72 hours. Thyroid Function Tests: No results for input(s): TSH, T4TOTAL, FREET4, T3FREE, THYROIDAB in the last 72 hours. Anemia Panel: No results for input(s): VITAMINB12, FOLATE, FERRITIN, TIBC, IRON, RETICCTPCT in the last 72 hours. Urine  analysis:    Component Value Date/Time   COLORURINE AMBER (A) 06/24/2017 1900   APPEARANCEUR TURBID (A) 06/24/2017 1900   LABSPEC 1.016 06/24/2017 1900   PHURINE 7.0 06/24/2017 1900   GLUCOSEU NEGATIVE 06/24/2017 1900   HGBUR NEGATIVE 06/24/2017 1900   BILIRUBINUR NEGATIVE 06/24/2017 1900   KETONESUR NEGATIVE 06/24/2017 1900   PROTEINUR 30 (A) 06/24/2017 1900   NITRITE NEGATIVE 06/24/2017 1900   LEUKOCYTESUR NEGATIVE 06/24/2017 1900   Sepsis Labs: @LABRCNTIP (procalcitonin:4,lacticidven:4)  ) Recent Results (from the past 240 hour(s))  Gastrointestinal Panel by PCR , Stool     Status: None   Collection Time: 06/29/17  8:21 AM  Result Value Ref  Range Status   Campylobacter species NOT DETECTED NOT DETECTED Final   Plesimonas shigelloides NOT DETECTED NOT DETECTED Final   Salmonella species NOT DETECTED NOT DETECTED Final   Yersinia enterocolitica NOT DETECTED NOT DETECTED Final   Vibrio species NOT DETECTED NOT DETECTED Final   Vibrio cholerae NOT DETECTED NOT DETECTED Final   Enteroaggregative E coli (EAEC) NOT DETECTED NOT DETECTED Final   Enteropathogenic E coli (EPEC) NOT DETECTED NOT DETECTED Final   Enterotoxigenic E coli (ETEC) NOT DETECTED NOT DETECTED Final   Shiga like toxin producing E coli (STEC) NOT DETECTED NOT DETECTED Final   Shigella/Enteroinvasive E coli (EIEC) NOT DETECTED NOT DETECTED Final   Cryptosporidium NOT DETECTED NOT DETECTED Final   Cyclospora cayetanensis NOT DETECTED NOT DETECTED Final   Entamoeba histolytica NOT DETECTED NOT DETECTED Final   Giardia lamblia NOT DETECTED NOT DETECTED Final   Adenovirus F40/41 NOT DETECTED NOT DETECTED Final   Astrovirus NOT DETECTED NOT DETECTED Final   Norovirus GI/GII NOT DETECTED NOT DETECTED Final   Rotavirus A NOT DETECTED NOT DETECTED Final   Sapovirus (I, II, IV, and V) NOT DETECTED NOT DETECTED Final      Radiology Studies: Dg Abd 1 View  Result Date: 07/01/2017 CLINICAL DATA:  Enterocolitis EXAM:  ABDOMEN - 1 VIEW COMPARISON:  June 30, 2017 FINDINGS: Nasogastric tube tip and side port are in the distal stomach. There is moderate stool in the colon. There is no bowel dilatation or air-fluid level to suggest bowel obstruction. No free air. There is postoperative change throughout the lower thoracic and lumbar regions. There is also postoperative change in the proximal right femur. IMPRESSION: Nasogastric tube tip and side port in distal stomach. No bowel obstruction or free air appreciable. Electronically Signed   By: Lowella Grip III M.D.   On: 07/01/2017 08:28   Dg Abd 1 View  Result Date: 06/29/2017 CLINICAL DATA:  Ileus EXAM: ABDOMEN - 1 VIEW COMPARISON:  06/29/2017, earlier the same day FINDINGS: Single fluoro image shows persistent gaseous bowel distention. NG tube tip projects at the level of the distal stomach. Extensive thoracolumbar fusion hardware again noted. IMPRESSION: Persisting gaseous bowel distension with NG tube tip projecting at the level of the distal stomach. Electronically Signed   By: Misty Stanley M.D.   On: 06/29/2017 16:06   Dg Abd Portable 1v  Result Date: 06/30/2017 CLINICAL DATA:  Ileus EXAM: PORTABLE ABDOMEN - 1 VIEW COMPARISON:  Portable exam 1007 hours compared to 06/29/2017 FINDINGS: Tip of nasogastric tube projects over gastric antrum. Scattered gas within nondistended large and small bowel loops. Excreted contrast material in urinary bladder. No definite bowel dilatation or bowel wall thickening. Osseous demineralization with prior thoracolumbar fusion and proximal RIGHT femoral ORIF. IMPRESSION: Nonspecific bowel gas pattern. Decreased bowel distention since previous exam. Electronically Signed   By: Lavonia Dana M.D.   On: 06/30/2017 10:28   Dg Abd Portable 1 View  Result Date: 06/29/2017 CLINICAL DATA:  NG tube placement EXAM: PORTABLE ABDOMEN - 1 VIEW COMPARISON:  CT 06/28/2017 FINDINGS: No NG tube visualized in the abdomen or mid to lower chest. Mild  cardiomegaly and vascular congestion. Gaseous distention of bowel noted, similar to prior CT. No free air organomegaly. IMPRESSION: No NG tube visualized in the mid to lower chest or abdomen. Electronically Signed   By: Rolm Baptise M.D.   On: 06/29/2017 13:44     Scheduled Meds: . enoxaparin (LOVENOX) injection  40 mg Subcutaneous Q24H  . ketorolac  15  mg Intravenous Q6H  . mouth rinse  15 mL Mouth Rinse BID  . pantoprazole (PROTONIX) IV  40 mg Intravenous Daily  . saccharomyces boulardii  250 mg Oral BID   Continuous Infusions: . dextrose 5 % and 0.45 % NaCl with KCl 20 mEq/L 100 mL/hr at 07/01/17 0533  . piperacillin-tazobactam (ZOSYN)  IV Stopped (07/01/17 0932)     LOS: 2 days        Aline August, MD [facility] Pager [phone]  If 7PM-7AM, please contact night-coverage [email or web address] Password [email or web address] 07/01/2017, 11:17 AM

## 2017-07-01 NOTE — Consult Note (Signed)
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  Patient presents to the ED with c/o progressively worsening abdominal pain.   Clinical Assessment and Goals of Care: Met with patient and one of her children, her son [name], and her daughter in Sports coach . She is alert and oriented. Her pain is controlled at this time.  She has a private duty around the clock caregiver at home. She states within the last year she lost a son to Alzheimer's, has had 2 hospitalizations for PNA, and has broken her hip which is s/p IMN 05/01/2017. She states she has a lot of "trouble" with her stomach and has IBS. She states she has been constipated but eating ok; her current weight is 101lb.   She has a DNR status. She states she does not want anymore testing because it could lead to a surgery. She wants to go home and say there.  GI came in during the meeting to speak with family, [name] has not passed gas since yesterday, they are planning for the NGT to be clamped for 4 hours, then removed if able depending on symptoms, and then evaluate for diet progression. Per conversation with GI, she is not felt to be a candidate for a venting PEG even if amenable.  Spoke with son [name] and daughter in Sports coach. Following removal of the NGT and outcome, her son is willing to work towards home with Hospice and understands Hospice is not in the home around the clock.  Will continue to follow.      Patient is Media planner. She states that she has a living will where [name] is her POA, and a family meeting with other  children is not needed, that [name] can update people.     SUMMARY OF RECOMMENDATIONS    No changes in pain regimen as her pain is controlled.   Will continue to follow to see how she does with NGT and plans for removal.   Code Status/Advance Care Planning:  DNR   Additional Recommendations (Limitations, Scope, Preferences):  She does not want surgery.   Prognosis:   Unable to determine This depends on how she does with removal of the NGT and oral intake or new obstruction. Home with Hospice vs Inpatient Hospice.   Discharge Planning: To Be Determined      Primary Diagnoses: Present on Admission: . (Resolved) Colitis . Ileus ([place]) . Colitis presumed infectious   I have reviewed the medical record, interviewed the patient and family, and examined the patient. The following aspects are pertinent.  Past Medical History:  Diagnosis Date  . Arthritis    "all over" (06/29/2017)  . Bronchiectasis ([place])   . Chronic lower  back pain   . History of hiatal hernia   . History of stomach ulcers   . Hypertension   . IBS (irritable bowel syndrome)   . Pneumonia    "twice this summer" (06/29/2017)   Social History   Social History  . Marital status: Widowed    Spouse name: N/A  . Number of children: N/A  . Years of education: N/A   Social History Main Topics  . Smoking status: Never Smoker  . Smokeless tobacco: Never Used  . Alcohol use No  . Drug use: No  . Sexual activity: No   Other Topics Concern  . None   Social History Narrative  . None   History reviewed. No pertinent family history. Scheduled Meds: . enoxaparin (LOVENOX) injection  40 mg Subcutaneous Q24H  . ketorolac  15 mg Intravenous Q6H  . mouth rinse  15 mL Mouth Rinse BID  . pantoprazole (PROTONIX) IV  40 mg Intravenous Daily  . saccharomyces boulardii  250 mg Oral BID   Continuous Infusions: . dextrose 5 % and 0.45 % NaCl with KCl 20 mEq/L 100 mL/hr at 07/01/17 0533  . piperacillin-tazobactam  (ZOSYN)  IV Stopped (07/01/17 0932)   PRN Meds:.Glycerin (Adult), LORazepam, morphine injection, ondansetron **OR** ondansetron (ZOFRAN) IV, promethazine Medications Prior to Admission:  Prior to Admission medications   Medication Sig Start Date End Date Taking? Authorizing Provider  amoxicillin-clavulanate (AUGMENTIN) 875-125 MG tablet Take 1 tablet by mouth 2 (two) times daily. 06/24/17 07/04/17 Yes Mikell, Jeani Sow, MD  celecoxib (CELEBREX) 100 MG capsule Take 1 capsule (100 mg total) by mouth 2 (two) times daily. 04/07/17  Yes Mikhail, Velta Addison, DO  DULoxetine (CYMBALTA) 60 MG capsule Take 60 mg by mouth at bedtime.  12/22/16  Yes [provider]  glycerin adult 2 g suppository Place 1 suppository rectally as needed for constipation. 06/24/17  Yes Mikell, Jeani Sow, MD  HYDROcodone-acetaminophen (NORCO) 7.5-325 MG tablet Take 1-2 tablets by mouth every 6 (six) hours as needed (pain). 05/01/17  Yes Nicholes Stairs, MD  hydrocortisone 2.5 % cream Apply 1 application topically 2 (two) times daily as needed (rash).  11/06/16  Yes [provider]  magnesium hydroxide (MILK OF MAGNESIA) 400 MG/5ML suspension Take 15-30 mLs by mouth daily as needed for mild constipation.   Yes [provider]  ondansetron (ZOFRAN ODT) 4 MG disintegrating tablet Take 1 tablet (4 mg total) by mouth every 8 (eight) hours as needed for nausea or vomiting. 06/24/17  Yes Carmon Sails J, PA-C  pantoprazole (PROTONIX) 40 MG tablet Take 40 mg by mouth daily. 01/19/17  Yes [provider]  polyethylene glycol (MIRALAX / GLYCOLAX) packet Take 34 g by mouth at bedtime. Mix in 6-8 oz liquid and drink   Yes [provider]  rOPINIRole (REQUIP) 0.25 MG tablet Take 1 tablet (0.25 mg total) by mouth daily as needed (severe restless legs). Patient taking differently: Take 0.25 mg by mouth at bedtime as needed (for severely restless legs).  02/10/17  Yes Domenic Polite, MD  senna-docusate  (SENOKOT-S) 8.6-50 MG tablet Take 1 tablet by mouth 2 (two) times daily as needed for mild constipation. 05/04/17  Yes Short, Noah Delaine, MD  Tetrahydrozoline HCl (VISINE OP) Place 1 drop into both eyes 2 (two) times daily as needed (for dry eyes).    Yes [provider]  triazolam (HALCION) 0.25 MG tablet Take 0.25 mg by mouth at bedtime. 01/22/17  Yes [provider]  Respiratory Therapy Supplies (FLUTTER) DEVI  Blow into device 3 times in a row twice daily. 04/17/17   Javier Glazier, MD   Allergies  Allergen Reactions  . Cerefolin [L-Methylfolate-B12-B6-B2] Nausea Only  . Codeine Nausea And Vomiting and Other (See Comments)    Dizziness   . Lactose Intolerance (Gi) Other (See Comments)    Constipation and bloating (patient has IBS) NO POTATOES, either NO RED MEAT  . Tape Other (See Comments)    Medical tape applied over the eyes results in a rash and irritation   Review of Systems  Constitutional: Positive for activity change and appetite change.  Gastrointestinal: Positive for abdominal pain and nausea.    Physical Exam  Constitutional: She is oriented to person, place, and time.  Thin and frail  HENT:  Head: Normocephalic and atraumatic.  Eyes: Pupils are equal, round, and reactive to light.  Cardiovascular:  Warm and dry  Pulmonary/Chest: Effort normal.  Abdominal: She exhibits distension.  NGT in place.  Neurological: She is alert and oriented to person, place, and time.    Vital Signs: BP (!) 104/56   Pulse (!) 105   Temp 98.6 F (37 C) (Axillary)   Resp 16   Ht 5' 2"  (1.575 m)   Wt 45.8 kg (101 lb)   SpO2 93%   BMI 18.47 kg/m  Pain Assessment: No/denies pain   Pain Score: 0-No pain   SpO2: SpO2: 93 % O2 Device:SpO2: 93 % O2 Flow Rate: .O2 Flow Rate (L/min): 2 L/min  IO: Intake/output summary:  Intake/Output Summary (Last 24 hours) at 07/01/17 1027 Last data filed at 07/01/17 0944  Gross per 24 hour  Intake              600 ml    Output             1350 ml  Net             -750 ml    LBM: Last BM Date: 06/30/17 Baseline Weight: Weight: 49.4 kg (109 lb) Most recent weight: Weight: 45.8 kg (101 lb)     Palliative Assessment/Data: 40% at best     Time In: 9:20 Time Out: 11:45 Time Total: 2 hour 25 minutes Greater than 50%  of this time was spent counseling and coordinating care related to the above assessment and plan.  Signed by: Asencion Gowda, NP 07/01/2017 10:54 AM Office: [phone] 7am-7pm  Call primary team after hours   Please contact Palliative Medicine Team phone at [phone] for questions and concerns.  For individual provider: See Shea Evans

## 2017-07-02 LAB — CBC
HCT: 27.5 % — ABNORMAL LOW (ref 36.0–46.0)
Hemoglobin: 9.1 g/dL — ABNORMAL LOW (ref 12.0–15.0)
MCH: 33.1 pg (ref 26.0–34.0)
MCHC: 33.1 g/dL (ref 30.0–36.0)
MCV: 100 fL (ref 78.0–100.0)
Platelets: 260 10*3/uL (ref 150–400)
RBC: 2.75 MIL/uL — ABNORMAL LOW (ref 3.87–5.11)
RDW: 14.4 % (ref 11.5–15.5)
WBC: 9.4 10*3/uL (ref 4.0–10.5)

## 2017-07-02 LAB — MAGNESIUM: MAGNESIUM: 1.8 mg/dL (ref 1.7–2.4)

## 2017-07-02 LAB — COMPREHENSIVE METABOLIC PANEL
ALK PHOS: 80 U/L (ref 38–126)
ALT: 8 U/L — AB (ref 14–54)
AST: 16 U/L (ref 15–41)
Albumin: 2.4 g/dL — ABNORMAL LOW (ref 3.5–5.0)
Anion gap: 9 (ref 5–15)
BILIRUBIN TOTAL: 0.7 mg/dL (ref 0.3–1.2)
BUN: 23 mg/dL — AB (ref 6–20)
CO2: 23 mmol/L (ref 22–32)
CREATININE: 1.06 mg/dL — AB (ref 0.44–1.00)
Calcium: 8.1 mg/dL — ABNORMAL LOW (ref 8.9–10.3)
Chloride: 101 mmol/L (ref 101–111)
GFR calc Af Amer: 54 mL/min — ABNORMAL LOW (ref >60)
GFR, EST NON AFRICAN AMERICAN: 47 mL/min — AB (ref >60)
Glucose, Bld: 106 mg/dL — ABNORMAL HIGH (ref 65–99)
Potassium: 3.8 mmol/L (ref 3.5–5.1)
Sodium: 133 mmol/L — ABNORMAL LOW (ref 135–145)
TOTAL PROTEIN: 5.5 g/dL — AB (ref 6.5–8.1)

## 2017-07-02 MED ORDER — LORAZEPAM 2 MG/ML PO CONC: 1.0000 mg | ORAL | Status: DC | PRN

## 2017-07-02 MED ORDER — LORAZEPAM 2 MG/ML PO CONC: 1.0000 mg | ORAL | 0 refills | Status: AC | PRN

## 2017-07-02 MED ORDER — HALOPERIDOL LACTATE 2 MG/ML PO CONC: 0.6000 mg | ORAL | 0 refills | Status: AC | PRN

## 2017-07-02 MED ORDER — ONDANSETRON HCL 4 MG PO TABS: 4.0000 mg | ORAL_TABLET | Freq: Four times a day (QID) | ORAL | 0 refills | Status: AC | PRN

## 2017-07-02 MED ORDER — HALOPERIDOL LACTATE 2 MG/ML PO CONC: 0.5000 mg | ORAL | Status: DC | PRN

## 2017-07-02 MED ORDER — ACETAMINOPHEN 650 MG RE SUPP
650.0000 mg | Freq: Four times a day (QID) | RECTAL | Status: DC | PRN
Start: 2017-07-02 — End: 2017-07-02
  Administered 2017-07-02: 650 mg via RECTAL
  Filled 2017-07-02: qty 1

## 2017-07-02 MED ORDER — MORPHINE SULFATE (CONCENTRATE) 10 MG/0.5ML PO SOLN: 10.0000 mg | ORAL | Status: DC | PRN

## 2017-07-02 MED ORDER — MORPHINE SULFATE (CONCENTRATE) 10 MG/0.5ML PO SOLN: 10.0000 mg | ORAL | 0 refills | Status: AC | PRN

## 2017-07-02 NOTE — Care Management Note (Addendum)
Case Management Note  Patient Details  Name: [name] MRN: [id number] Date of Birth: 1930/09/20  Subjective/Objective:                    Action/Plan: Confirmed patient's PCP is Orpah Melter.  Discussed discharge planning with patient and multiple family members at bedside including son [name].   All in agreement for patient to be discharged to home today. EPICC information is correct. Family has arranged for private care caregivers through [place] around the clock.  Offered choice of Home with Hospice agencies they would like [facility] and [facility], referral called to [facility] at [facility] office.   Patient already has bedside commode and hospital bed at home. Patient and family cannot think of any other DME needs at this time.  Attending MD working on discharge.   NCM will place ambulance transport papers in shadow chart. With [facility] number and call when paperwork completed. Family aware discharge is today , they would like a time frame . Explained once paperwork complete will call ambulance . [facility] usually gives estimated time of transport but if emergency occurs time frame may change. Son voices understanding. Expected Discharge Date:  07/02/17               Expected Discharge Plan:  Home w Hospice Care  In-House Referral:     Discharge planning Services  CM Consult  Post Acute Care Choice:    Choice offered to:  Patient, Adult Children  DME Arranged:    DME Agency:     HH Arranged:    [place] Agency:  [facility]  Status of Service:  In process, will continue to follow  If discussed at Long Length of Stay Meetings, dates discussed:    Additional Comments:  Marilu Favre, RN 07/02/2017, 11:49 AM

## 2017-07-02 NOTE — Progress Notes (Signed)
Daily Progress Note   Patient Name: [name]       Date: 07/02/2017 DOB: October 24, 1929  Age: 83 y.o. MRN#: [id number] Attending Physician: Aline August, MD Primary Care Physician: Orpah Melter, MD Admit Date: 06/28/2017  Reason for Consultation/Follow-up: Goals of care  Subjective: Ms. Tomasini is resting in bed.  Her abdomen is increasingly distended, the NGT was removed yesterday and she would not want that replaced though she is speaking in short sentences.  She has had a chance to talk to her family and would like to go home with comfort care. Son [name] and daughter in law present at bedside.    MOST form filled out with Ms. Keahey, copy placed in chart, and CM notified.    Length of Stay: 3  Current Medications: Scheduled Meds:  . pantoprazole (PROTONIX) IV  40 mg Intravenous Daily    Continuous Infusions:   PRN Meds: acetaminophen, Glycerin (Adult), haloperidol, LORazepam, morphine CONCENTRATE, ondansetron **OR** ondansetron (ZOFRAN) IV, promethazine  Physical Exam  Constitutional: She is oriented to person, place, and time.  Thin, frail  HENT:  Head: Normocephalic.  Eyes: EOM are normal.  Cardiovascular:  Warm and dry  Pulmonary/Chest:  Somewhat short of breath with speaking.   Abdominal: She exhibits distension. There is tenderness.  Musculoskeletal: She exhibits no edema.  Neurological: She is alert and oriented to person, place, and time.            Vital Signs: BP 116/64 (BP Location: Right Arm)   Pulse (!) 121   Temp (!) 101.9 F (38.8 C) (Oral)   Resp 16   Ht 5\' 2"  (1.575 m)   Wt 45.8 kg (101 lb)   SpO2 92%   BMI 18.47 kg/m  SpO2: SpO2: 92 % O2 Device: O2 Device: Not Delivered O2 Flow Rate: O2 Flow Rate (L/min): 2 L/min  Intake/output  summary:  Intake/Output Summary (Last 24 hours) at 07/02/17 1107 Last data filed at 07/02/17 0200  Gross per 24 hour  Intake             1475 ml  Output              600 ml  Net              875 ml   LBM: Last BM Date: 06/30/17 Baseline  Weight: Weight: 49.4 kg (109 lb) Most recent weight: Weight: 45.8 kg (101 lb)       Palliative Assessment/Data: 20%      Patient Active Problem List   Diagnosis Date Noted  . Sinus tachycardia 06/30/2017  . Anemia of chronic disease 06/30/2017  . Ileus ([place]) 06/29/2017  . Colitis presumed infectious 06/29/2017  . Hip fracture ([place]) 05/01/2017  . Depression 05/01/2017  . Bronchiectasis without complication ([place]) 46/96/2952  . Protein-calorie malnutrition, severe 04/07/2017  . Pleural effusion   . Consolidation lung ([place]) left lower lobe 04/05/2017  . Loss of weight 04/05/2017  . Cough   . Left lower lobe pneumonia ([place]) 04/04/2017  . GERD (gastroesophageal reflux disease) 04/04/2017  . IBS (irritable bowel syndrome)   . Dysphagia 02/06/2017  . 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 Patient presents to the ED with c/o progressively worsening abdominal pain.    Assessment: Ms. Wartman is frail and thin. She has been NPO due to ileus with clear liquid diet initiated this morning. She is thirsty for juice and states she is a little hungry.   Recommendations/Plan:  Home with Hospice.   Goals of Care and Additional Recommendations:  Limitations on Scope of Treatment: Full Comfort Care  Code Status:    Code Status Orders        Start     Ordered   06/29/17 0206  Do not attempt resuscitation (DNR)  Continuous    Question Answer Comment  In the event of cardiac or respiratory ARREST Do not call a "code blue"   In the event of cardiac or respiratory ARREST Do not perform Intubation, CPR,  defibrillation or ACLS   In the event of cardiac or respiratory ARREST Use medication by any route, position, wound care, and other measures to relive pain and suffering. May use oxygen, suction and manual treatment of airway obstruction as needed for comfort.      06/29/17 0205    Code Status History    Date Active Date Inactive Code Status Order ID Comments User Context   05/01/2017  7:35 AM 05/04/2017  9:15 PM DNR [id number]  Rondel Jumbo, PA-C ED   04/04/2017  5:15 PM 04/07/2017  9:16 PM Partial Code [id number]  Truett Mainland, DO Inpatient   02/06/2017  8:29 PM 02/10/2017  6:46 PM DNR [id number]  Domenic Polite, MD Inpatient    Advance Directive Documentation     Most Recent Value  Type of Advance Directive  Healthcare Power of Attorney  Pre-existing out of facility DNR order (yellow form or pink MOST form)  -  "MOST" Form in Place?  -       Prognosis:   < 4 weeks Depending on oral intake.  Discharge Planning:  Home with Hospice  Care plan was discussed with Dr. Starla Link and CM  Thank you for allowing the Palliative Medicine Team to assist in the care of this patient.   Total Time 1.2 hours Prolonged Time Billed  No      Greater than 50%  of this time was spent counseling and coordinating care related to the above assessment and plan.  Asencion Gowda, NP  Please contact Palliative Medicine Team phone at [phone] for questions and concerns.

## 2017-07-02 NOTE — Discharge Planning (Signed)
Patient IV removed. RN assessment and VS revealed stability for DC to home with hospice.  All equipment has been delivered to home prior to DC.  Family aware of DC and plans.  Scripts printed and signed. Yellow DNR signed and also given to family.  No FU appts suggested at this time.  EMS contacted to transport - family informed.

## 2017-07-02 NOTE — Progress Notes (Signed)
Nutrition Brief Note  Chart reviewed. Pt now transitioning to comfort care.  No further nutrition interventions warranted at this time.  Please re-consult as needed.   Keilon Ressel A. Farzad Tibbetts, RD, LDN, CDE Pager: [phone] After hours Pager: [phone]  

## 2017-07-02 NOTE — Care Management (Signed)
[facility] called for transport home. They on their way. Patient, family, bedside nurse and Amy with [facility] all aware.  Magdalen Spatz RN BSN [phone]

## 2017-07-02 NOTE — Discharge Summary (Signed)
Physician Discharge Summary  [name] PPJ:[id number] DOB: Apr 10, 1930 DOA: 06/28/2017  PCP: Orpah Melter, MD  Admit date: 06/28/2017 Discharge date: 07/02/2017  Admitted From: Home Disposition:  Home with hospice  Recommendations for Outpatient Follow-up:  1. Follow up with home hospice at earliest [place]: Home hospice  Equipment/Devices: None  Discharge Condition: Poor  CODE STATUS: DNR  Diet recommendation: As tolerated  Brief/Interim Summary:  83 yo F with history of IBS, HTN who presented to the ER with worsening abdominal distention and pain.  She had diarrhea about one week ago and was started on augmentin for enterocolitis in the ER four days prior to admission.  Her stool softeners were stopped.  No further BMs since that visit, but had worsening abdominal discomfort so came to the ER.  She was found to have sepsis secondary to enterocolitis with ileus. She was started on antibiotics, IV fluids, NG tube. She is very frail and has been suffering from severe pains. Patient and her family have been interested in talking about goals of care and possible hospice if she does not continue to improve.  GI and palliative care were consulted. Patient's condition didn't improve and her pain was getting worse. After discussion with palliative care team, patient and family have decided that patient will be going home with hospice. Overall prognosis is poor.  Discharge Diagnoses:  Principal Problem:   Colitis presumed infectious Active Problems:   Ileus ([place])   Sinus tachycardia   Anemia of chronic disease  Sepsis secondary to enterocolitis  -  Start diet as tolerated - Cultures negative so far. DC antibiotics as patient is going home with hospice  Enterocolitis and ileus - Currently on Zosyn. NG tube removed yesterday as per GI recommendations. - continue pain management as per home hospice - no further antibiotics  Generalized deconditioning - plan  as above  Metabolic encephalopathy secondary to sepsis and ileus -  Much more alert  IBS -  Holding oral medications  Bronchiectasis, at risk for pneumonia and acute on chronic respiratory failure  Anemia of chronic disease and acute illness - plan as above - no further labs or imaging  Discharge Instructions  Discharge Instructions    Diet - low sodium heart healthy    Complete by:  As directed    Discharge instructions    Complete by:  As directed    Diet as tolerated   Increase activity slowly    Complete by:  As directed      Allergies as of 07/02/2017      Reactions   Cerefolin [l-methylfolate-b12-b6-b2] Nausea Only   Codeine Nausea And Vomiting, Other (See Comments)   Dizziness   Lactose Intolerance (gi) Other (See Comments)   Constipation and bloating (patient has IBS) NO POTATOES, either NO RED MEAT   Tape Other (See Comments)   Medical tape applied over the eyes results in a rash and irritation      Medication List    STOP taking these medications   amoxicillin-clavulanate 875-125 MG tablet Commonly known as:  AUGMENTIN   celecoxib 100 MG capsule Commonly known as:  CELEBREX   DULoxetine 60 MG capsule Commonly known as:  CYMBALTA   FLUTTER Devi   HYDROcodone-acetaminophen 7.5-325 MG tablet Commonly known as:  NORCO   hydrocortisone 2.5 % cream   magnesium hydroxide 400 MG/5ML suspension Commonly known as:  MILK OF MAGNESIA   ondansetron 4 MG disintegrating tablet Commonly known as:  ZOFRAN ODT  pantoprazole 40 MG tablet Commonly known as:  PROTONIX   polyethylene glycol packet Commonly known as:  MIRALAX / GLYCOLAX   triazolam 0.25 MG tablet Commonly known as:  HALCION     TAKE these medications   glycerin adult 2 g suppository Place 1 suppository rectally as needed for constipation.   haloperidol 2 MG/ML solution Commonly known as:  HALDOL Take 0.3 mLs (0.6 mg total) by mouth every 4 (four) hours as needed for agitation.    LORazepam 2 MG/ML concentrated solution Commonly known as:  ATIVAN Take 0.5 mLs (1 mg total) by mouth every 4 (four) hours as needed for anxiety.   morphine CONCENTRATE 10 MG/0.5ML Soln concentrated solution Place 0.5 mLs (10 mg total) under the tongue every 2 (two) hours as needed for severe pain or shortness of breath.   ondansetron 4 MG tablet Commonly known as:  ZOFRAN Take 1 tablet (4 mg total) by mouth every 6 (six) hours as needed for nausea.   rOPINIRole 0.25 MG tablet Commonly known as:  REQUIP Take 1 tablet (0.25 mg total) by mouth daily as needed (severe restless legs). What changed:  when to take this  reasons to take this   senna-docusate 8.6-50 MG tablet Commonly known as:  Senokot-S Take 1 tablet by mouth 2 (two) times daily as needed for mild constipation.   VISINE OP Place 1 drop into both eyes 2 (two) times daily as needed (for dry eyes).            Discharge Care Instructions        Start     Ordered   07/02/17 0000  haloperidol (HALDOL) 2 MG/ML solution  Every 4 hours PRN     07/02/17 1138   07/02/17 0000  LORazepam (ATIVAN) 2 MG/ML concentrated solution  Every 4 hours PRN     07/02/17 1138   07/02/17 0000  Morphine Sulfate (MORPHINE CONCENTRATE) 10 MG/0.5ML SOLN concentrated solution  Every 2 hours PRN     07/02/17 1138   07/02/17 0000  ondansetron (ZOFRAN) 4 MG tablet  Every 6 hours PRN     07/02/17 1138   07/02/17 0000  Increase activity slowly     07/02/17 1138   07/02/17 0000  Diet - low sodium heart healthy     07/02/17 1138   07/02/17 0000  Discharge instructions    Comments:  Diet as tolerated   07/02/17 1138      Allergies  Allergen Reactions  . Cerefolin [L-Methylfolate-B12-B6-B2] Nausea Only  . Codeine Nausea And Vomiting and Other (See Comments)    Dizziness   . Lactose Intolerance (Gi) Other (See Comments)    Constipation and bloating (patient has IBS) NO POTATOES, either NO RED MEAT  . Tape Other (See Comments)     Medical tape applied over the eyes results in a rash and irritation    Consultations:  GI  Palliative care   Procedures/Studies: Dg Abd 1 View  Result Date: 07/01/2017 CLINICAL DATA:  Enterocolitis EXAM: ABDOMEN - 1 VIEW COMPARISON:  June 30, 2017 FINDINGS: Nasogastric tube tip and side port are in the distal stomach. There is moderate stool in the colon. There is no bowel dilatation or air-fluid level to suggest bowel obstruction. No free air. There is postoperative change throughout the lower thoracic and lumbar regions. There is also postoperative change in the proximal right femur. IMPRESSION: Nasogastric tube tip and side port in distal stomach. No bowel obstruction or free air appreciable. Electronically Signed  By: Lowella Grip III M.D.   On: 07/01/2017 08:28   Dg Abd 1 View  Result Date: 06/29/2017 CLINICAL DATA:  Ileus EXAM: ABDOMEN - 1 VIEW COMPARISON:  06/29/2017, earlier the same day FINDINGS: Single fluoro image shows persistent gaseous bowel distention. NG tube tip projects at the level of the distal stomach. Extensive thoracolumbar fusion hardware again noted. IMPRESSION: Persisting gaseous bowel distension with NG tube tip projecting at the level of the distal stomach. Electronically Signed   By: Misty Stanley M.D.   On: 06/29/2017 16:06   Ct Abdomen Pelvis W Contrast  Result Date: 06/29/2017 CLINICAL DATA:  Abdominal pain and fever. Recent diagnosis of colitis. On antibiotics. No bowel movement for 5 days. Assess for abscess. EXAM: CT ABDOMEN AND PELVIS WITH CONTRAST TECHNIQUE: Multidetector CT imaging of the abdomen and pelvis was performed using the standard protocol following bolus administration of intravenous contrast. CONTRAST:  139mL ISOVUE-300 IOPAMIDOL (ISOVUE-300) INJECTION 61% COMPARISON:  CT abdomen and pelvis June 24, 2017 an CT abdomen and pelvis March 18, 2017 FINDINGS: LOWER CHEST: Moderate RIGHT and small LEFT pleural effusions. Bibasilar  atelectasis. Heart size is normal. No pericardial effusion. HEPATOBILIARY: 1 cm cyst LEFT lobe of the liver. Trace periportal edema. Distended gallbladder without acute cholecystitis by CT. PANCREAS: Normal. SPLEEN: Normal. ADRENALS/URINARY TRACT: Kidneys are orthotopic, demonstrating symmetric enhancement. No nephrolithiasis, hydronephrosis or solid renal masses. Too small to characterize hypodensities in the kidneys bilaterally. The unopacified ureters are normal in course and caliber. Delayed imaging through the kidneys demonstrates symmetric prompt contrast excretion within the proximal urinary collecting system. Urinary bladder is partially distended and unremarkable. Normal adrenal glands. STOMACH/BOWEL: Rectal wall thickening. Moderate amount of retained large bowel stool. Small and large bowel wall thickening. Enteric contrast has not yet reached the distal small bowel. Small bowel air-fluid levels. Proximal large bowel air feces levels. VASCULAR/LYMPHATIC: Aortoiliac vessels are normal in course and caliber. Mild calcific atherosclerosis. No lymphadenopathy by CT size criteria. REPRODUCTIVE: Normal. OTHER: Small amount of ascites. No focal fluid collection or free air. MUSCULOSKELETAL: Mild anasarca. RIGHT femur ORIF. Osteopenia. T10 through S1 posterior instrumentation, RIGHT T10 pedicle screw violates the superior cortex. Lower lumbar spine arthrodesis. IMPRESSION: 1. Worsening enterocolitis. Rectal wall thickening/proctitis ; given chronicity, neoplasm is possible. 2. Ileus.  Moderate amount of retained large bowel stool. 3. Small volume ascites.  No drainable fluid collection. 4. Similar moderate RIGHT and small LEFT pleural effusions. Aortic Atherosclerosis (ICD10-I70.0). Electronically Signed   By: Elon Alas M.D.   On: 06/29/2017 00:26   Ct Abdomen Pelvis W Contrast  Result Date: 06/24/2017 CLINICAL DATA:  Mid upper abd pain with liquid stools with hx of IBS, pts GI MD sent here for eval  for blockage, last normal BM x 3 days ago EXAM: CT ABDOMEN AND PELVIS WITH CONTRAST TECHNIQUE: Multidetector CT imaging of the abdomen and pelvis was performed using the standard protocol following bolus administration of intravenous contrast. CONTRAST:  148mL ISOVUE-300 IOPAMIDOL (ISOVUE-300) INJECTION 61% COMPARISON:  CT abdomen dated 03/18/2017. FINDINGS: Lower chest: Bibasilar pleural effusions, right greater than left, at least moderate in size on the right, both incompletely imaged. Additional small bibasilar consolidations, incompletely imaged, most likely atelectasis. Hepatobiliary: Stable small hypodense lesion within the left liver lobe, too small to definitively characterize, but most likely benign cyst. No suspicious mass or lesion within the liver. Gallbladder is mildly distended but otherwise unremarkable. No bile duct dilatation. Pancreas: Unremarkable. No pancreatic ductal dilatation or surrounding inflammatory changes. Spleen: Normal in size  without focal abnormality. Adrenals/Urinary Tract: Adrenal glands are poorly seen. Kidneys are unremarkable without mass, stone or hydronephrosis. Bladder appears normal. Stomach/Bowel: Fluid is seen throughout the grossly nondistended large and small bowel, with associated air-fluid levels throughout. No dilated large or small bowel loops identified. Questionable thickening/enhancement of the walls of the small bowel in the central abdomen and upper pelvis, difficult to characterize without oral contrast. Additional questionable thickening of the walls of the rectum. Vascular/Lymphatic: No significant vascular findings are present. No enlarged abdominal or pelvic lymph nodes. Reproductive: Unremarkable. No adnexal mass or free fluid identified. Other: No free fluid or abscess collection identified. No free intraperitoneal air seen. Musculoskeletal: No acute or suspicious osseous finding. Fixation hardware appears appropriately positioned within the lumbar spine  and lower thoracic spine, status post surgical changes of central canal decompression in the lumbar spine. Anasarca. IMPRESSION: 1. Fluid throughout the grossly nondistended large and small bowel, with associated air-fluid levels throughout, suggesting a diffuse gastroenteritis or enterocolitis. No evidence of bowel obstruction. Questionable thickening of the walls of the small bowel in the central abdomen and upper pelvis, again suggesting infectious or inflammatory enteritis. 2. Additional questionable thickening of the walls of the rectum (associated proctocolitis? ). Neoplastic rectal wall thickening cannot be excluded on these images. 3. Bibasilar pleural effusions, right greater than left, at least moderate in size on the right, incompletely imaged. Small adjacent consolidations are most likely atelectasis. Electronically Signed   By: Franki Cabot M.D.   On: 06/24/2017 20:17   Dg Abd Portable 1v  Result Date: 06/30/2017 CLINICAL DATA:  Ileus EXAM: PORTABLE ABDOMEN - 1 VIEW COMPARISON:  Portable exam 1007 hours compared to 06/29/2017 FINDINGS: Tip of nasogastric tube projects over gastric antrum. Scattered gas within nondistended large and small bowel loops. Excreted contrast material in urinary bladder. No definite bowel dilatation or bowel wall thickening. Osseous demineralization with prior thoracolumbar fusion and proximal RIGHT femoral ORIF. IMPRESSION: Nonspecific bowel gas pattern. Decreased bowel distention since previous exam. Electronically Signed   By: Lavonia Dana M.D.   On: 06/30/2017 10:28   Dg Abd Portable 1 View  Result Date: 06/29/2017 CLINICAL DATA:  NG tube placement EXAM: PORTABLE ABDOMEN - 1 VIEW COMPARISON:  CT 06/28/2017 FINDINGS: No NG tube visualized in the abdomen or mid to lower chest. Mild cardiomegaly and vascular congestion. Gaseous distention of bowel noted, similar to prior CT. No free air organomegaly. IMPRESSION: No NG tube visualized in the mid to lower chest or  abdomen. Electronically Signed   By: Rolm Baptise M.D.   On: 06/29/2017 13:44      Subjective: Patient seen and examined at bedside and discussed with son (power of attorney). She complains of abdominal pain, not getting better.  Discharge Exam: Vitals:   07/01/17 2148 07/02/17 0537  BP: 130/71 116/64  Pulse: (!) 110 (!) 121  Resp: 16 16  Temp: 98.6 F (37 C) (!) 101.9 F (38.8 C)  SpO2: 98% 92%   Vitals:   07/01/17 0529 07/01/17 1438 07/01/17 2148 07/02/17 0537  BP: (!) 104/56 106/61 130/71 116/64  Pulse: (!) 105 (!) 102 (!) 110 (!) 121  Resp: 16 17 16 16   Temp: 98.6 F (37 C) 99.6 F (37.6 C) 98.6 F (37 C) (!) 101.9 F (38.8 C)  TempSrc: Axillary Oral Oral Oral  SpO2: 93% 100% 98% 92%  Weight:      Height:        General: Pt is in mild distress due to abdominal pain Cardiovascular:tachycardic,  S1/S2 + Respiratory: Bilateral decreased breath sounds at bases    The results of significant diagnostics from this hospitalization (including imaging, microbiology, ancillary and laboratory) are listed below for reference.     Microbiology: Recent Results (from the past 240 hour(s))  Gastrointestinal Panel by PCR , Stool     Status: None   Collection Time: 06/29/17  8:21 AM  Result Value Ref Range Status   Campylobacter species NOT DETECTED NOT DETECTED Final   Plesimonas shigelloides NOT DETECTED NOT DETECTED Final   Salmonella species NOT DETECTED NOT DETECTED Final   Yersinia enterocolitica NOT DETECTED NOT DETECTED Final   Vibrio species NOT DETECTED NOT DETECTED Final   Vibrio cholerae NOT DETECTED NOT DETECTED Final   Enteroaggregative E coli (EAEC) NOT DETECTED NOT DETECTED Final   Enteropathogenic E coli (EPEC) NOT DETECTED NOT DETECTED Final   Enterotoxigenic E coli (ETEC) NOT DETECTED NOT DETECTED Final   Shiga like toxin producing E coli (STEC) NOT DETECTED NOT DETECTED Final   Shigella/Enteroinvasive E coli (EIEC) NOT DETECTED NOT DETECTED Final    Cryptosporidium NOT DETECTED NOT DETECTED Final   Cyclospora cayetanensis NOT DETECTED NOT DETECTED Final   Entamoeba histolytica NOT DETECTED NOT DETECTED Final   Giardia lamblia NOT DETECTED NOT DETECTED Final   Adenovirus F40/41 NOT DETECTED NOT DETECTED Final   Astrovirus NOT DETECTED NOT DETECTED Final   Norovirus GI/GII NOT DETECTED NOT DETECTED Final   Rotavirus A NOT DETECTED NOT DETECTED Final   Sapovirus (I, II, IV, and V) NOT DETECTED NOT DETECTED Final     Labs: BNP (last 3 results) No results for input(s): BNP in the last 8760 hours. Basic Metabolic Panel:  Recent Labs Lab 06/28/17 2055 06/29/17 0909 06/30/17 0348 07/01/17 0334 07/02/17 0450  NA 133* 133* 133* 133* 133*  K 4.0 4.1 3.3* 3.4* 3.8  CL 98* 102 100* 100* 101  CO2 24 23 23 24 23   GLUCOSE 105* 99 123* 116* 106*  BUN 16 17 18 20  23*  CREATININE 0.81 1.00 0.91 1.02* 1.06*  CALCIUM 9.4 8.5* 8.3* 8.1* 8.1*  MG  --  1.7 2.1 1.8 1.8   Liver Function Tests:  Recent Labs Lab 06/28/17 2055 06/30/17 0348 07/01/17 0334 07/02/17 0450  AST 17 17 16 16   ALT 8* 9* 9* 8*  ALKPHOS 88 92 81 80  BILITOT 0.8 0.8 0.6 0.7  PROT 6.3* 5.2* 5.3* 5.5*  ALBUMIN 3.4* 2.7* 2.5* 2.4*    Recent Labs Lab 06/28/17 2055  LIPASE 23   No results for input(s): AMMONIA in the last 168 hours. CBC:  Recent Labs Lab 06/28/17 2055 06/29/17 0645 06/30/17 0348 07/01/17 0334 07/02/17 0450  WBC 6.9 4.9 8.2 10.2 9.4  NEUTROABS 6.0  --   --   --   --   HGB 9.8* 9.2* 10.3* 9.9* 9.1*  HCT 29.4* 27.2* 32.1* 30.8* 27.5*  MCV 100.7* 101.1* 104.2* 101.3* 100.0  PLT 319 249 260 261 260   Cardiac Enzymes: No results for input(s): CKTOTAL, CKMB, CKMBINDEX, TROPONINI in the last 168 hours. BNP: Invalid input(s): POCBNP CBG: No results for input(s): GLUCAP in the last 168 hours. D-Dimer No results for input(s): DDIMER in the last 72 hours. Hgb A1c No results for input(s): HGBA1C in the last 72 hours. Lipid Profile No  results for input(s): CHOL, HDL, LDLCALC, TRIG, CHOLHDL, LDLDIRECT in the last 72 hours. Thyroid function studies No results for input(s): TSH, T4TOTAL, T3FREE, THYROIDAB in the last 72 hours.  Invalid input(s):  FREET3 Anemia work up No results for input(s): VITAMINB12, FOLATE, FERRITIN, TIBC, IRON, RETICCTPCT in the last 72 hours. Urinalysis    Component Value Date/Time   COLORURINE AMBER (A) 06/24/2017 1900   APPEARANCEUR TURBID (A) 06/24/2017 1900   LABSPEC 1.016 06/24/2017 1900   PHURINE 7.0 06/24/2017 1900   GLUCOSEU NEGATIVE 06/24/2017 1900   HGBUR NEGATIVE 06/24/2017 1900   BILIRUBINUR NEGATIVE 06/24/2017 1900   KETONESUR NEGATIVE 06/24/2017 1900   PROTEINUR 30 (A) 06/24/2017 1900   NITRITE NEGATIVE 06/24/2017 1900   LEUKOCYTESUR NEGATIVE 06/24/2017 1900   Sepsis Labs Invalid input(s): PROCALCITONIN,  WBC,  LACTICIDVEN Microbiology Recent Results (from the past 240 hour(s))  Gastrointestinal Panel by PCR , Stool     Status: None   Collection Time: 06/29/17  8:21 AM  Result Value Ref Range Status   Campylobacter species NOT DETECTED NOT DETECTED Final   Plesimonas shigelloides NOT DETECTED NOT DETECTED Final   Salmonella species NOT DETECTED NOT DETECTED Final   Yersinia enterocolitica NOT DETECTED NOT DETECTED Final   Vibrio species NOT DETECTED NOT DETECTED Final   Vibrio cholerae NOT DETECTED NOT DETECTED Final   Enteroaggregative E coli (EAEC) NOT DETECTED NOT DETECTED Final   Enteropathogenic E coli (EPEC) NOT DETECTED NOT DETECTED Final   Enterotoxigenic E coli (ETEC) NOT DETECTED NOT DETECTED Final   Shiga like toxin producing E coli (STEC) NOT DETECTED NOT DETECTED Final   Shigella/Enteroinvasive E coli (EIEC) NOT DETECTED NOT DETECTED Final   Cryptosporidium NOT DETECTED NOT DETECTED Final   Cyclospora cayetanensis NOT DETECTED NOT DETECTED Final   Entamoeba histolytica NOT DETECTED NOT DETECTED Final   Giardia lamblia NOT DETECTED NOT DETECTED Final    Adenovirus F40/41 NOT DETECTED NOT DETECTED Final   Astrovirus NOT DETECTED NOT DETECTED Final   Norovirus GI/GII NOT DETECTED NOT DETECTED Final   Rotavirus A NOT DETECTED NOT DETECTED Final   Sapovirus (I, II, IV, and V) NOT DETECTED NOT DETECTED Final     Time coordinating discharge: 35 minutes  SIGNED:   Aline August, MD  [facility] 07/02/2017, 11:38 AM Pager: [phone]  If 7PM-7AM, please contact night-coverage [email or web address] Password [email or web address]

## 2017-07-02 NOTE — Progress Notes (Signed)
Hospice and [facility]) Hospital Liaison:  RN visit.  Notified by Rosario Adie, of patient/family request for [facility] services at home after discharge.  Chart and patient information under review by [facility] physician.  Hospice eligibility pending at this time.   Writer spoke with [name] and other family members at bedside to initiate education related to hospice philosophy, services and team approach to care.  Family verbalized understanding of information given.  Per discussion, plan is for discharge to home by [facility] on 07/02/17.  Please send signed and completed DNR form home with patient/family.  Patient will need prescriptions for discharge comfort medications.  DME needs have been discussed, patient currently has the following equipment in the home:  Hospital bed, shower chair, walker.  Family advised that they have no current needs for equipment.    [facility] aware of the above.  Completed discharge summary will need to be faxed to [facility] at [phone] when final.  Please notify [facility] when patient is ready to leave the unit at discharge.  Call [phone] or [phone] after 5pm.  [facility] information and contact numbers given to [place], son, during this visit.  Above information shared with Nira Conn, [facility].  Please call with any hospice related questions.  Thank you for this referral,  Edyth Gunnels, RN, BSN [facility] Liaison [phone] liaisons are now on [place].

## 2017-07-02 NOTE — Progress Notes (Signed)
Daily Progress Note   Patient Name: RANDELL DETTER       Date: 07/02/2017 DOB: [birth year]  Age: 83 y.o. MRN#: [id number] Attending Physician: Aline August, MD Primary Care Physician: Orpah Melter, MD Admit Date: 06/28/2017  Reason for Consultation/Follow-up: Goals of care  Subjective: Ms. Hush is resting in bed.  Her abdomen is increasingly distended, the NGT was removed yesterday and she would not want that replaced though she is speaking in short sentences.  She has had a chance to talk to her family and would like to go home with comfort care. Son [name] and daughter in law present at bedside.    MOST form filled out with Ms. Ramseyer and CM notified.    Length of Stay: 3  Current Medications: Scheduled Meds:  . pantoprazole (PROTONIX) IV  40 mg Intravenous Daily    Continuous Infusions:   PRN Meds: acetaminophen, Glycerin (Adult), haloperidol, LORazepam, morphine CONCENTRATE, ondansetron **OR** ondansetron (ZOFRAN) IV, promethazine  Physical Exam  Constitutional: She is oriented to person, place, and time.  Thin, frail  HENT:  Head: Normocephalic.  Eyes: EOM are normal.  Cardiovascular:  Warm and dry  Pulmonary/Chest:  Somewhat short of breath with speaking.   Abdominal: She exhibits distension. There is tenderness.  Musculoskeletal: She exhibits no edema.  Neurological: She is alert and oriented to person, place, and time.            Vital Signs: BP 116/64 (BP Location: Right Arm)   Pulse (!) 121   Temp (!) 101.9 F (38.8 C) (Oral)   Resp 16   Ht 5\' 2"  (1.575 m)   Wt 45.8 kg (101 lb)   SpO2 92%   BMI 18.47 kg/m  SpO2: SpO2: 92 % O2 Device: O2 Device: Not Delivered O2 Flow Rate: O2 Flow Rate (L/min): 2 L/min  Intake/output summary:  Intake/Output  Summary (Last 24 hours) at 07/02/17 1107 Last data filed at 07/02/17 0200  Gross per 24 hour  Intake             1475 ml  Output              600 ml  Net              875 ml   LBM: Last BM Date: 06/30/17 Baseline Weight: Weight: 49.4  kg (109 lb) Most recent weight: Weight: 45.8 kg (101 lb)       Palliative Assessment/Data: 20%      Patient Active Problem List   Diagnosis Date Noted  . Sinus tachycardia 06/30/2017  . Anemia of chronic disease 06/30/2017  . Ileus ([place]) 06/29/2017  . Colitis presumed infectious 06/29/2017  . Hip fracture ([place]) 05/01/2017  . Depression 05/01/2017  . Bronchiectasis without complication ([place]) 68/61/6837  . Protein-calorie malnutrition, severe 04/07/2017  . Pleural effusion   . Consolidation lung ([place]) left lower lobe 04/05/2017  . Loss of weight 04/05/2017  . Cough   . Left lower lobe pneumonia ([place]) 04/04/2017  . GERD (gastroesophageal reflux disease) 04/04/2017  . IBS (irritable bowel syndrome)   . Dysphagia 02/06/2017  . 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 Patient presents to the ED with c/o progressively worsening abdominal pain.    Assessment: Ms. Frese is frail and thin. She has been NPO due to ileus with clear liquid diet initiated this morning. She is thirsty for juice and states she is a little hungry.   Recommendations/Plan:  Home with Hospice.   Goals of Care and Additional Recommendations:  Limitations on Scope of Treatment: Full Comfort Care  Code Status:    Code Status Orders        Start     Ordered   06/29/17 0206  Do not attempt resuscitation (DNR)  Continuous    Question Answer Comment  In the event of cardiac or respiratory ARREST Do not call a "code blue"   In the event of cardiac or respiratory ARREST Do not perform Intubation, CPR, defibrillation or ACLS   In  the event of cardiac or respiratory ARREST Use medication by any route, position, wound care, and other measures to relive pain and suffering. May use oxygen, suction and manual treatment of airway obstruction as needed for comfort.      06/29/17 0205    Code Status History    Date Active Date Inactive Code Status Order ID Comments User Context   05/01/2017  7:35 AM 05/04/2017  9:15 PM DNR [id number]  Rondel Jumbo, PA-C ED   04/04/2017  5:15 PM 04/07/2017  9:16 PM Partial Code [id number]  Truett Mainland, DO Inpatient   02/06/2017  8:29 PM 02/10/2017  6:46 PM DNR [id number]  Domenic Polite, MD Inpatient    Advance Directive Documentation     Most Recent Value  Type of Advance Directive  Healthcare Power of Attorney  Pre-existing out of facility DNR order (yellow form or pink MOST form)  -  "MOST" Form in Place?  -       Prognosis:   < 4 weeks Depending on oral intake.  Discharge Planning:  Home with Hospice  Care plan was discussed with Dr. Starla Link and CM  Thank you for allowing the Palliative Medicine Team to assist in the care of this patient.   Total Time 1.2 hours Prolonged Time Billed  No      Greater than 50%  of this time was spent counseling and coordinating care related to the above assessment and plan.  Asencion Gowda, NP  Please contact Palliative Medicine Team phone at [phone] for questions and concerns.

## 2017-07-02 NOTE — Progress Notes (Signed)
Plans for dischg & hospice care noted. Will sign off.  Cleotis Nipper, M.D. Pager [phone] If no answer or after 5 PM call [phone]

## 2017-08-06 DEATH — deceased

## 2017-11-24 IMAGING — CT CT ABD-PELV W/ CM
2 of 5 series · 16 of 46 positions shown, 18 images · IV contrast (iopamidol)
Comparison: CT abdomen and pelvis June 24, 2017 an CT abdomen
and pelvis March 18, 2017

CLINICAL DATA: Abdominal pain and fever. Recent diagnosis of
colitis. On antibiotics. No bowel movement for 5 days. Assess for
abscess.

EXAM:
CT ABDOMEN AND PELVIS WITH CONTRAST
TECHNIQUE: Multidetector CT imaging of the abdomen and pelvis was performed
using the standard protocol following bolus administration of
intravenous contrast.
CONTRAST:  100mL VB0BTH-VCC IOPAMIDOL (VB0BTH-VCC) INJECTION 61%

[Series 3: a/p w/ 5mm · axial · 0.69mm/px · z∈[-390,-30]mm · 13 of 82 slices shown, 15 images]
[im 5/82  soft-tissue]
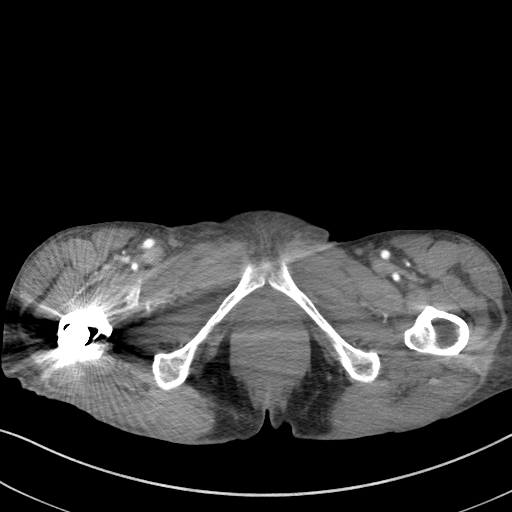
[im 5/82  bone]
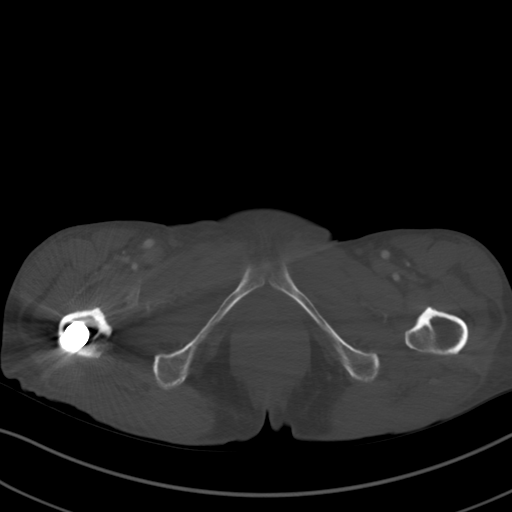
[im 13/82  soft-tissue]
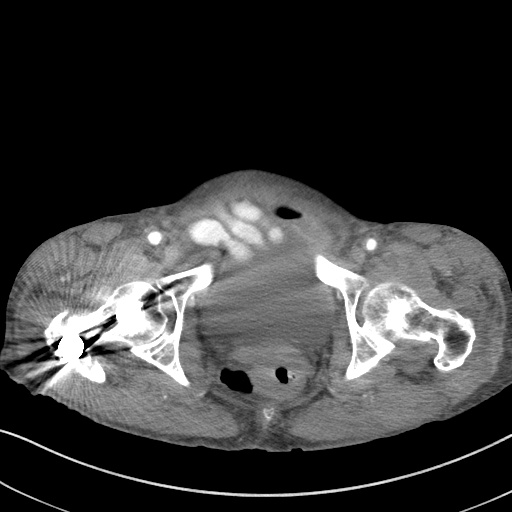
[im 18/82  soft-tissue]
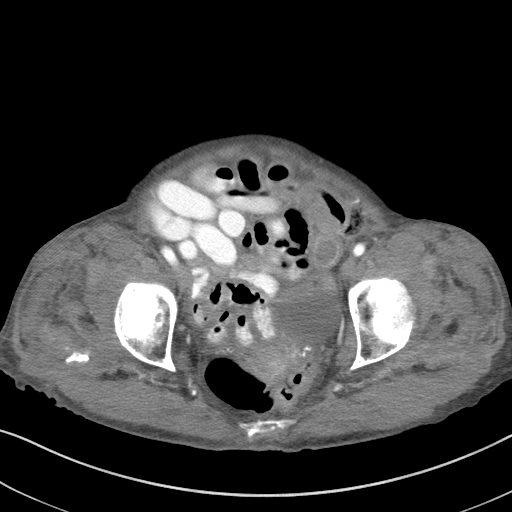
[im 22/82  soft-tissue]
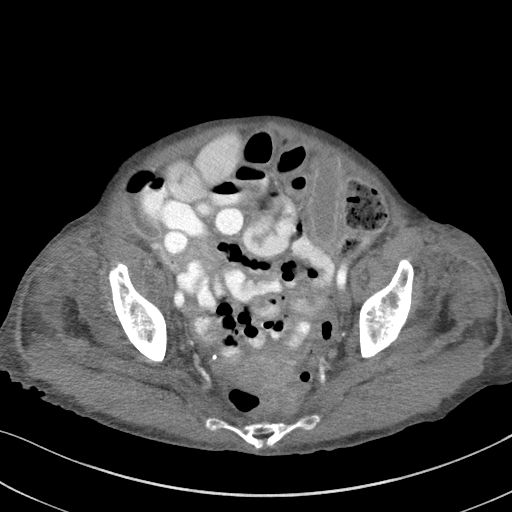
[im 30/82  soft-tissue]
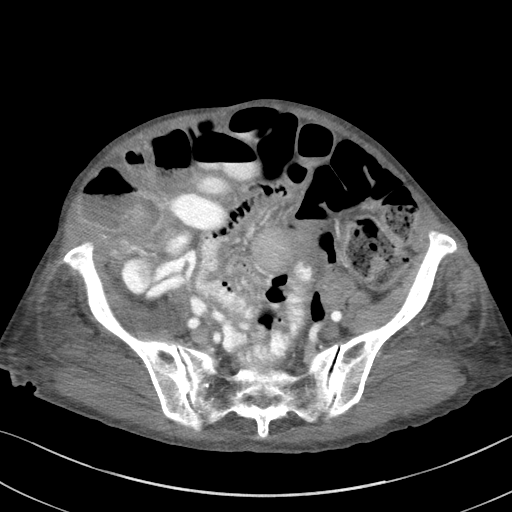
[im 35/82  soft-tissue]
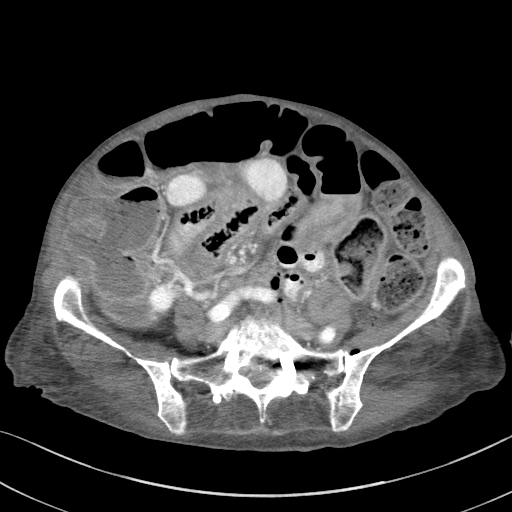
[im 43/82  soft-tissue]
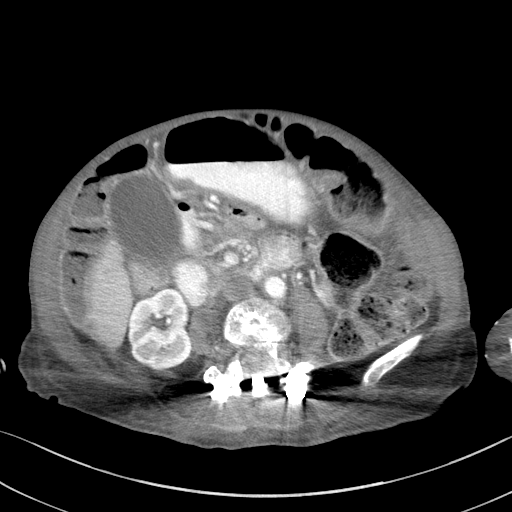
[im 47/82  soft-tissue]
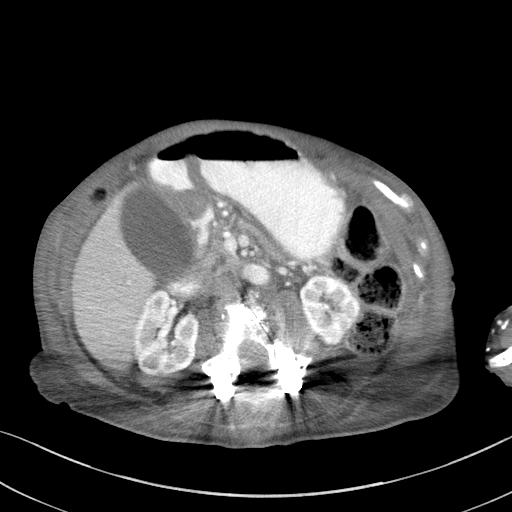
[im 52/82  soft-tissue]
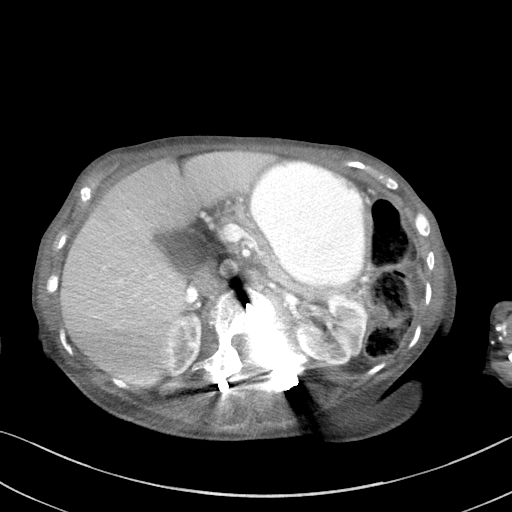
[im 52/82  bone]
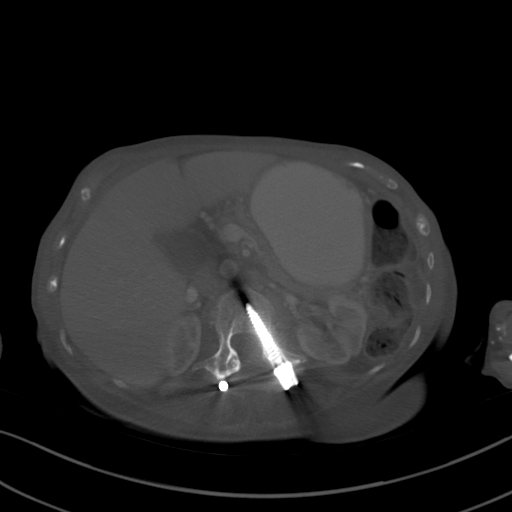
[im 60/82  soft-tissue]
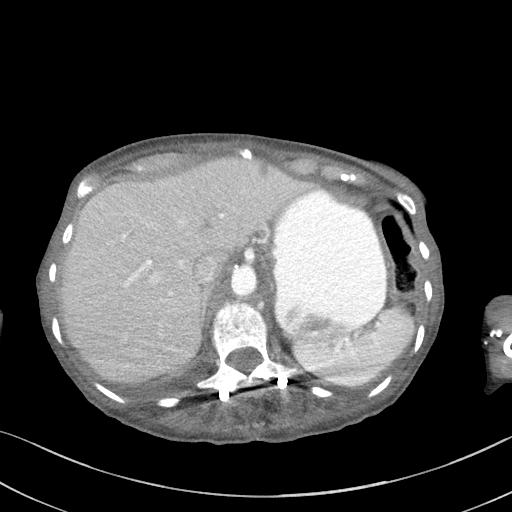
[im 64/82  soft-tissue]
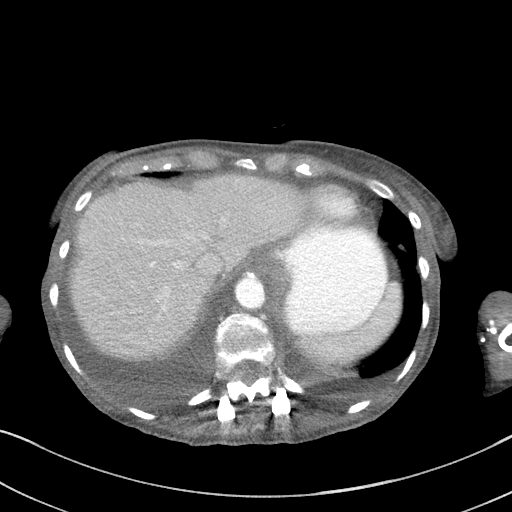
[im 69/82  soft-tissue]
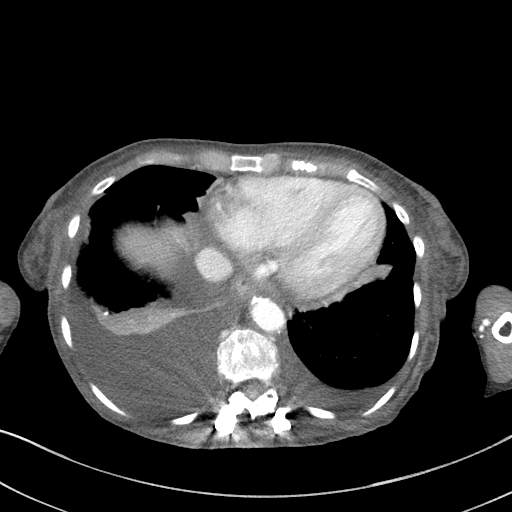
[im 77/82  soft-tissue]
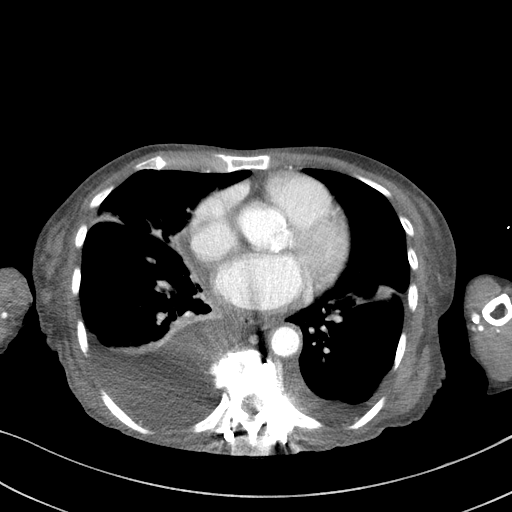

[Series 6: a/p w/ cor · coronal · 0.80mm/px · 3 of 146 slices shown]
[im 49/146  soft-tissue]
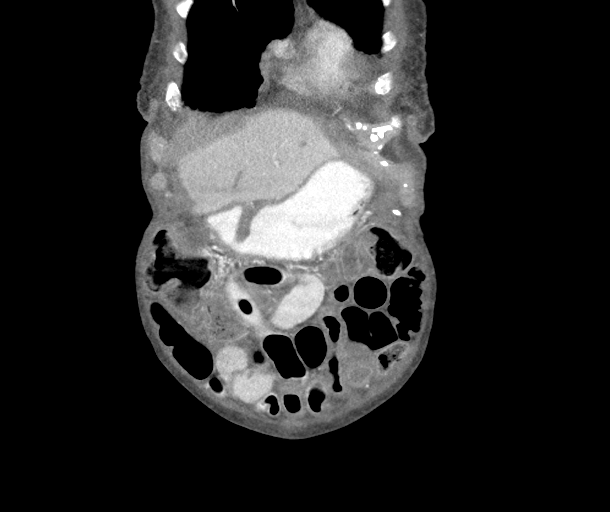
[im 65/146  soft-tissue]
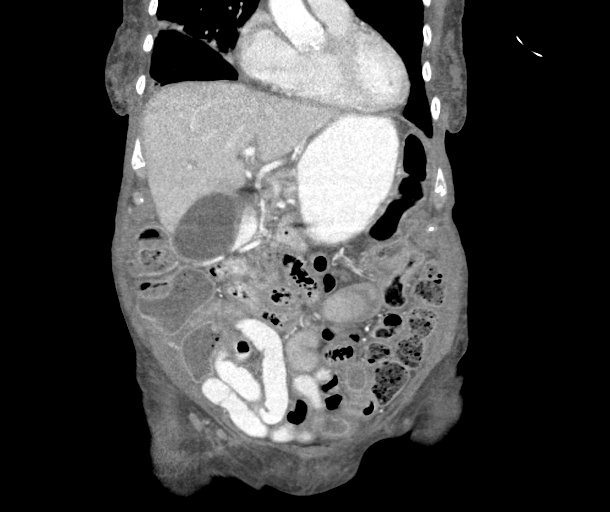
[im 81/146  soft-tissue]
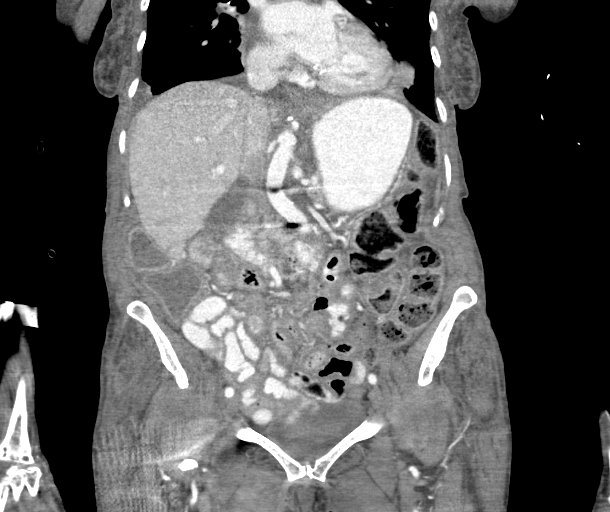

[16 of 46 positions shown; findings below may reference images not displayed]

FINDINGS: LOWER CHEST: Moderate RIGHT and small LEFT pleural effusions.
Bibasilar atelectasis. Heart size is normal. No pericardial
effusion.

HEPATOBILIARY: 1 cm cyst LEFT lobe of the liver. Trace periportal
edema. Distended gallbladder without acute cholecystitis by CT.

PANCREAS: Normal.

SPLEEN: Normal.

ADRENALS/URINARY TRACT: Kidneys are orthotopic, demonstrating
symmetric enhancement. No nephrolithiasis, hydronephrosis or solid
renal masses. Too small to characterize hypodensities in the kidneys
bilaterally. The unopacified ureters are normal in course and
caliber. Delayed imaging through the kidneys demonstrates symmetric
prompt contrast excretion within the proximal urinary collecting
system. Urinary bladder is partially distended and unremarkable.
Normal adrenal glands.

STOMACH/BOWEL: Rectal wall thickening. Moderate amount of retained
large bowel stool. Small and large bowel wall thickening. Enteric
contrast has not yet reached the distal small bowel. Small bowel
air-fluid levels. Proximal large bowel air feces levels.

VASCULAR/LYMPHATIC: Aortoiliac vessels are normal in course and
caliber. Mild calcific atherosclerosis. No lymphadenopathy by CT
size criteria.

REPRODUCTIVE: Normal.

OTHER: Small amount of ascites. No focal fluid collection or free
air.

MUSCULOSKELETAL: Mild anasarca. RIGHT femur ORIF. Osteopenia. T10
through S1 posterior instrumentation, RIGHT T10 pedicle screw
violates the superior cortex. Lower lumbar spine arthrodesis.
IMPRESSION: 1. Worsening enterocolitis. Rectal wall thickening/proctitis ; given
chronicity, neoplasm is possible.
2. Ileus.  Moderate amount of retained large bowel stool.
3. Small volume ascites.  No drainable fluid collection.
4. Similar moderate RIGHT and small LEFT pleural effusions.
Aortic Atherosclerosis ([id number]).
